# Patient Record
Sex: Female | Born: 1935 | Race: Black or African American | Hispanic: No | Smoking: Never smoker
Health system: Southern US, Community
[De-identification: ages and names within clinical notes are randomized; demographics above are authoritative.]

## PROBLEM LIST (undated history)

## (undated) DIAGNOSIS — D869 Sarcoidosis, unspecified: Secondary | ICD-10-CM

## (undated) DIAGNOSIS — R42 Dizziness and giddiness: Secondary | ICD-10-CM

## (undated) DIAGNOSIS — E876 Hypokalemia: Secondary | ICD-10-CM

## (undated) DIAGNOSIS — E119 Type 2 diabetes mellitus without complications: Secondary | ICD-10-CM

## (undated) DIAGNOSIS — N200 Calculus of kidney: Secondary | ICD-10-CM

## (undated) DIAGNOSIS — K219 Gastro-esophageal reflux disease without esophagitis: Secondary | ICD-10-CM

## (undated) DIAGNOSIS — I10 Essential (primary) hypertension: Secondary | ICD-10-CM

## (undated) DIAGNOSIS — G809 Cerebral palsy, unspecified: Secondary | ICD-10-CM

## (undated) DIAGNOSIS — E559 Vitamin D deficiency, unspecified: Secondary | ICD-10-CM

## (undated) HISTORY — DX: Hypokalemia: E87.6

## (undated) HISTORY — PX: MULTIPLE TOOTH EXTRACTIONS: SHX2053

## (undated) HISTORY — PX: TONSILLECTOMY: SUR1361

## (undated) HISTORY — PX: OTHER SURGICAL HISTORY: SHX169

## (undated) HISTORY — DX: Vitamin D deficiency, unspecified: E55.9

## (undated) HISTORY — PX: LUNG REMOVAL, PARTIAL: SHX233

## (undated) HISTORY — DX: Type 2 diabetes mellitus without complications: E11.9

## (undated) HISTORY — DX: Essential (primary) hypertension: I10

## (undated) HISTORY — PX: EYE SURGERY: SHX253

## (undated) HISTORY — PX: ABDOMINAL HYSTERECTOMY: SHX81

## (undated) HISTORY — DX: Sarcoidosis, unspecified: D86.9

## (undated) HISTORY — DX: Dizziness and giddiness: R42

---

## 1997-10-12 ENCOUNTER — Ambulatory Visit (HOSPITAL_COMMUNITY): Admission: RE | Admit: 1997-10-12 | Discharge: 1997-10-12 | Payer: Self-pay | Admitting: Internal Medicine

## 1997-10-28 ENCOUNTER — Ambulatory Visit: Admission: RE | Admit: 1997-10-28 | Discharge: 1997-10-28 | Payer: Self-pay | Admitting: Internal Medicine

## 1998-01-31 ENCOUNTER — Ambulatory Visit (HOSPITAL_COMMUNITY): Admission: RE | Admit: 1998-01-31 | Discharge: 1998-01-31 | Payer: Self-pay | Admitting: Internal Medicine

## 1999-08-09 ENCOUNTER — Encounter: Admission: RE | Admit: 1999-08-09 | Discharge: 1999-08-09 | Payer: Self-pay | Admitting: Internal Medicine

## 1999-08-09 ENCOUNTER — Encounter: Payer: Self-pay | Admitting: Internal Medicine

## 1999-08-28 ENCOUNTER — Encounter: Payer: Self-pay | Admitting: Internal Medicine

## 1999-08-28 ENCOUNTER — Encounter: Admission: RE | Admit: 1999-08-28 | Discharge: 1999-08-28 | Payer: Self-pay | Admitting: Internal Medicine

## 1999-11-17 ENCOUNTER — Other Ambulatory Visit: Admission: RE | Admit: 1999-11-17 | Discharge: 1999-11-17 | Payer: Self-pay | Admitting: Obstetrics & Gynecology

## 2000-12-23 ENCOUNTER — Encounter: Payer: Self-pay | Admitting: Internal Medicine

## 2000-12-23 ENCOUNTER — Encounter: Admission: RE | Admit: 2000-12-23 | Discharge: 2000-12-23 | Payer: Self-pay | Admitting: Internal Medicine

## 2000-12-26 ENCOUNTER — Encounter: Admission: RE | Admit: 2000-12-26 | Discharge: 2000-12-26 | Payer: Self-pay | Admitting: Internal Medicine

## 2000-12-26 ENCOUNTER — Encounter: Payer: Self-pay | Admitting: Internal Medicine

## 2001-02-11 ENCOUNTER — Other Ambulatory Visit: Admission: RE | Admit: 2001-02-11 | Discharge: 2001-02-11 | Payer: Self-pay | Admitting: Obstetrics and Gynecology

## 2001-02-21 ENCOUNTER — Encounter: Admission: RE | Admit: 2001-02-21 | Discharge: 2001-02-21 | Payer: Self-pay | Admitting: Internal Medicine

## 2001-02-21 ENCOUNTER — Encounter: Payer: Self-pay | Admitting: Internal Medicine

## 2001-05-01 ENCOUNTER — Ambulatory Visit: Admission: RE | Admit: 2001-05-01 | Discharge: 2001-05-01 | Payer: Self-pay | Admitting: Internal Medicine

## 2001-12-17 ENCOUNTER — Encounter: Payer: Self-pay | Admitting: Urology

## 2001-12-17 ENCOUNTER — Encounter: Admission: RE | Admit: 2001-12-17 | Discharge: 2001-12-17 | Payer: Self-pay | Admitting: Urology

## 2001-12-30 ENCOUNTER — Encounter: Admission: RE | Admit: 2001-12-30 | Discharge: 2001-12-30 | Payer: Self-pay | Admitting: Internal Medicine

## 2001-12-30 ENCOUNTER — Encounter: Payer: Self-pay | Admitting: Internal Medicine

## 2002-07-07 ENCOUNTER — Encounter: Admission: RE | Admit: 2002-07-07 | Discharge: 2002-07-07 | Payer: Self-pay | Admitting: Internal Medicine

## 2002-07-07 ENCOUNTER — Encounter: Payer: Self-pay | Admitting: Internal Medicine

## 2003-01-12 ENCOUNTER — Encounter: Payer: Self-pay | Admitting: Internal Medicine

## 2003-01-12 ENCOUNTER — Encounter: Admission: RE | Admit: 2003-01-12 | Discharge: 2003-01-12 | Payer: Self-pay | Admitting: Internal Medicine

## 2003-04-16 ENCOUNTER — Other Ambulatory Visit: Admission: RE | Admit: 2003-04-16 | Discharge: 2003-04-16 | Payer: Self-pay | Admitting: Obstetrics and Gynecology

## 2003-10-04 ENCOUNTER — Encounter: Admission: RE | Admit: 2003-10-04 | Discharge: 2003-10-04 | Payer: Self-pay | Admitting: Internal Medicine

## 2004-06-21 ENCOUNTER — Encounter: Admission: RE | Admit: 2004-06-21 | Discharge: 2004-06-21 | Payer: Self-pay | Admitting: Internal Medicine

## 2005-07-24 ENCOUNTER — Other Ambulatory Visit: Admission: RE | Admit: 2005-07-24 | Discharge: 2005-07-24 | Payer: Self-pay | Admitting: Obstetrics and Gynecology

## 2005-09-05 ENCOUNTER — Encounter: Admission: RE | Admit: 2005-09-05 | Discharge: 2005-09-05 | Payer: Self-pay | Admitting: Internal Medicine

## 2005-10-31 ENCOUNTER — Encounter: Admission: RE | Admit: 2005-10-31 | Discharge: 2005-10-31 | Payer: Self-pay | Admitting: Obstetrics and Gynecology

## 2005-11-14 ENCOUNTER — Encounter: Admission: RE | Admit: 2005-11-14 | Discharge: 2005-11-14 | Payer: Self-pay | Admitting: Obstetrics and Gynecology

## 2007-09-14 ENCOUNTER — Emergency Department (HOSPITAL_COMMUNITY): Admission: EM | Admit: 2007-09-14 | Discharge: 2007-09-14 | Payer: Self-pay | Admitting: Emergency Medicine

## 2007-12-19 ENCOUNTER — Encounter: Admission: RE | Admit: 2007-12-19 | Discharge: 2007-12-19 | Payer: Self-pay | Admitting: Obstetrics and Gynecology

## 2008-07-23 ENCOUNTER — Encounter: Admission: RE | Admit: 2008-07-23 | Discharge: 2008-07-23 | Payer: Self-pay | Admitting: Internal Medicine

## 2008-12-21 ENCOUNTER — Encounter: Admission: RE | Admit: 2008-12-21 | Discharge: 2008-12-21 | Payer: Self-pay | Admitting: Internal Medicine

## 2009-03-13 ENCOUNTER — Emergency Department (HOSPITAL_COMMUNITY): Admission: EM | Admit: 2009-03-13 | Discharge: 2009-03-13 | Payer: Self-pay | Admitting: Emergency Medicine

## 2010-02-09 ENCOUNTER — Encounter: Admission: RE | Admit: 2010-02-09 | Discharge: 2010-02-09 | Payer: Self-pay | Admitting: Internal Medicine

## 2010-02-14 ENCOUNTER — Emergency Department (HOSPITAL_COMMUNITY)
Admission: EM | Admit: 2010-02-14 | Discharge: 2010-02-15 | Payer: Self-pay | Source: Home / Self Care | Admitting: Emergency Medicine

## 2010-03-02 ENCOUNTER — Encounter: Admission: RE | Admit: 2010-03-02 | Discharge: 2010-03-02 | Payer: Self-pay | Admitting: Internal Medicine

## 2010-05-14 ENCOUNTER — Observation Stay (HOSPITAL_COMMUNITY): Admission: EM | Admit: 2010-05-14 | Discharge: 2010-05-18 | Payer: Self-pay | Admitting: Emergency Medicine

## 2010-05-15 ENCOUNTER — Encounter (INDEPENDENT_AMBULATORY_CARE_PROVIDER_SITE_OTHER): Payer: Self-pay | Admitting: Internal Medicine

## 2010-07-30 ENCOUNTER — Encounter: Payer: Self-pay | Admitting: Obstetrics and Gynecology

## 2010-09-19 LAB — GLUCOSE, CAPILLARY
Glucose-Capillary: 105 mg/dL — ABNORMAL HIGH (ref 70–99)
Glucose-Capillary: 108 mg/dL — ABNORMAL HIGH (ref 70–99)
Glucose-Capillary: 111 mg/dL — ABNORMAL HIGH (ref 70–99)
Glucose-Capillary: 148 mg/dL — ABNORMAL HIGH (ref 70–99)
Glucose-Capillary: 151 mg/dL — ABNORMAL HIGH (ref 70–99)
Glucose-Capillary: 155 mg/dL — ABNORMAL HIGH (ref 70–99)
Glucose-Capillary: 158 mg/dL — ABNORMAL HIGH (ref 70–99)
Glucose-Capillary: 183 mg/dL — ABNORMAL HIGH (ref 70–99)
Glucose-Capillary: 184 mg/dL — ABNORMAL HIGH (ref 70–99)
Glucose-Capillary: 95 mg/dL (ref 70–99)
Glucose-Capillary: 98 mg/dL (ref 70–99)
Glucose-Capillary: 99 mg/dL (ref 70–99)

## 2010-09-19 LAB — URINALYSIS, ROUTINE W REFLEX MICROSCOPIC
Bilirubin Urine: NEGATIVE
Glucose, UA: 500 mg/dL — AB
Hgb urine dipstick: NEGATIVE
Ketones, ur: NEGATIVE mg/dL
Protein, ur: NEGATIVE mg/dL
Urobilinogen, UA: 0.2 mg/dL (ref 0.0–1.0)

## 2010-09-19 LAB — BASIC METABOLIC PANEL
BUN: 18 mg/dL (ref 6–23)
Chloride: 112 mEq/L (ref 96–112)
Chloride: 112 mEq/L (ref 96–112)
GFR calc Af Amer: >60 mL/min (ref >60)
GFR calc Af Amer: >60 mL/min (ref >60)
GFR calc non Af Amer: >60 mL/min (ref >60)
Potassium: 4.1 mEq/L (ref 3.5–5.1)
Potassium: 4.4 mEq/L (ref 3.5–5.1)
Sodium: 144 mEq/L (ref 135–145)
Sodium: 145 mEq/L (ref 135–145)

## 2010-09-19 LAB — COMPREHENSIVE METABOLIC PANEL
ALT: 34 U/L (ref 0–35)
ALT: 35 U/L (ref 0–35)
AST: 34 U/L (ref 0–37)
AST: 42 U/L — ABNORMAL HIGH (ref 0–37)
Albumin: 3.7 g/dL (ref 3.5–5.2)
Alkaline Phosphatase: 158 U/L — ABNORMAL HIGH (ref 39–117)
CO2: 25 mEq/L (ref 19–32)
Calcium: 9.6 mg/dL (ref 8.4–10.5)
Calcium: 9.7 mg/dL (ref 8.4–10.5)
GFR calc Af Amer: >60 mL/min (ref >60)
GFR calc Af Amer: >60 mL/min (ref >60)
Glucose, Bld: 165 mg/dL — ABNORMAL HIGH (ref 70–99)
Glucose, Bld: 239 mg/dL — ABNORMAL HIGH (ref 70–99)
Potassium: 3.7 mEq/L (ref 3.5–5.1)
Sodium: 141 mEq/L (ref 135–145)
Sodium: 144 mEq/L (ref 135–145)
Total Protein: 7.7 g/dL (ref 6.0–8.3)
Total Protein: 8 g/dL (ref 6.0–8.3)

## 2010-09-19 LAB — POCT CARDIAC MARKERS
CKMB, poc: 2.3 ng/mL (ref 1.0–8.0)
Myoglobin, poc: 58.6 ng/mL (ref 12–200)

## 2010-09-19 LAB — DIFFERENTIAL
Basophils Relative: 0 % (ref 0–1)
Eosinophils Absolute: 0 10*3/uL (ref 0.0–0.7)
Eosinophils Absolute: 0 10*3/uL (ref 0.0–0.7)
Eosinophils Relative: 0 % (ref 0–5)
Lymphs Abs: 1.3 10*3/uL (ref 0.7–4.0)
Lymphs Abs: 1.6 10*3/uL (ref 0.7–4.0)
Monocytes Relative: 5 % (ref 3–12)
Monocytes Relative: 5 % (ref 3–12)
Neutrophils Relative %: 84 % — ABNORMAL HIGH (ref 43–77)
Neutrophils Relative %: 87 % — ABNORMAL HIGH (ref 43–77)

## 2010-09-19 LAB — CBC
HCT: 41.3 % (ref 36.0–46.0)
HCT: 46 % (ref 36.0–46.0)
Hemoglobin: 15.4 g/dL — ABNORMAL HIGH (ref 12.0–15.0)
MCH: 27.9 pg (ref 26.0–34.0)
MCHC: 33.5 g/dL (ref 30.0–36.0)
MCV: 83.5 fL (ref 78.0–100.0)
MCV: 83.8 fL (ref 78.0–100.0)
Platelets: 208 10*3/uL (ref 150–400)
RBC: 4.92 MIL/uL (ref 3.87–5.11)
RBC: 5.32 MIL/uL — ABNORMAL HIGH (ref 3.87–5.11)
RDW: 14 % (ref 11.5–15.5)
WBC: 17.1 10*3/uL — ABNORMAL HIGH (ref 4.0–10.5)
WBC: 7 10*3/uL (ref 4.0–10.5)

## 2010-09-19 LAB — URINE CULTURE
Colony Count: NO GROWTH
Culture: NO GROWTH

## 2010-09-19 LAB — MAGNESIUM: Magnesium: 2 mg/dL (ref 1.5–2.5)

## 2010-09-19 LAB — HEMOGLOBIN A1C: Mean Plasma Glucose: 140 mg/dL — ABNORMAL HIGH (ref <117)

## 2010-09-19 LAB — PROTIME-INR
INR: 0.94 (ref 0.00–1.49)
Prothrombin Time: 12.8 seconds (ref 11.6–15.2)

## 2010-09-19 LAB — LIPID PANEL
Cholesterol: 131 mg/dL (ref 0–200)
Total CHOL/HDL Ratio: 1.6 RATIO
VLDL: 14 mg/dL (ref 0–40)

## 2010-09-19 LAB — CK TOTAL AND CKMB (NOT AT ARMC): Relative Index: INVALID (ref 0.0–2.5)

## 2010-09-19 LAB — CARDIAC PANEL(CRET KIN+CKTOT+MB+TROPI): Relative Index: INVALID (ref 0.0–2.5)

## 2010-09-22 LAB — DIFFERENTIAL
Basophils Relative: 1 % (ref 0–1)
Eosinophils Relative: 1 % (ref 0–5)
Lymphocytes Relative: 21 % (ref 12–46)
Monocytes Absolute: 0.3 10*3/uL (ref 0.1–1.0)
Monocytes Relative: 4 % (ref 3–12)
Neutro Abs: 5.2 10*3/uL (ref 1.7–7.7)

## 2010-09-22 LAB — CBC
HCT: 50.8 % — ABNORMAL HIGH (ref 36.0–46.0)
Hemoglobin: 16.9 g/dL — ABNORMAL HIGH (ref 12.0–15.0)
MCHC: 33.2 g/dL (ref 30.0–36.0)
MCV: 84 fL (ref 78.0–100.0)

## 2010-09-22 LAB — URINALYSIS, ROUTINE W REFLEX MICROSCOPIC
Glucose, UA: NEGATIVE mg/dL
Hgb urine dipstick: NEGATIVE
Ketones, ur: NEGATIVE mg/dL
pH: 7.5 (ref 5.0–8.0)

## 2010-09-22 LAB — POCT CARDIAC MARKERS
CKMB, poc: 1.7 ng/mL (ref 1.0–8.0)
Troponin i, poc: <0.05 ng/mL (ref 0.00–0.09)

## 2010-09-22 LAB — POCT I-STAT, CHEM 8
Chloride: 107 mEq/L (ref 96–112)
HCT: 55 % — ABNORMAL HIGH (ref 36.0–46.0)
Potassium: 3.5 mEq/L (ref 3.5–5.1)

## 2010-10-19 ENCOUNTER — Encounter: Payer: Self-pay | Admitting: Internal Medicine

## 2010-10-31 ENCOUNTER — Emergency Department (HOSPITAL_COMMUNITY)
Admission: EM | Admit: 2010-10-31 | Discharge: 2010-11-01 | Disposition: A | Discharge status: Home / Self Care | Payer: Medicare Other | Attending: Emergency Medicine | Admitting: Emergency Medicine

## 2010-10-31 DIAGNOSIS — R5381 Other malaise: Secondary | ICD-10-CM | POA: Insufficient documentation

## 2010-10-31 DIAGNOSIS — G809 Cerebral palsy, unspecified: Secondary | ICD-10-CM | POA: Insufficient documentation

## 2010-10-31 DIAGNOSIS — I10 Essential (primary) hypertension: Secondary | ICD-10-CM | POA: Insufficient documentation

## 2010-10-31 DIAGNOSIS — R000 Tachycardia, unspecified: Secondary | ICD-10-CM | POA: Insufficient documentation

## 2010-11-01 LAB — COMPREHENSIVE METABOLIC PANEL
ALT: 27 U/L (ref 0–35)
AST: 31 U/L (ref 0–37)
Alkaline Phosphatase: 126 U/L — ABNORMAL HIGH (ref 39–117)
CO2: 27 mEq/L (ref 19–32)
Calcium: 8.7 mg/dL (ref 8.4–10.5)
Chloride: 105 mEq/L (ref 96–112)
GFR calc Af Amer: >60 mL/min (ref >60)
GFR calc non Af Amer: >60 mL/min (ref >60)
Potassium: 3.7 mEq/L (ref 3.5–5.1)
Sodium: 140 mEq/L (ref 135–145)

## 2010-11-01 LAB — CBC
HCT: 45.7 % (ref 36.0–46.0)
Hemoglobin: 15.3 g/dL — ABNORMAL HIGH (ref 12.0–15.0)
MCV: 80.5 fL (ref 78.0–100.0)
WBC: 15.1 10*3/uL — ABNORMAL HIGH (ref 4.0–10.5)

## 2010-11-01 LAB — DIFFERENTIAL
Lymphocytes Relative: 10 % — ABNORMAL LOW (ref 12–46)
Lymphs Abs: 1.5 10*3/uL (ref 0.7–4.0)
Neutro Abs: 12.3 10*3/uL — ABNORMAL HIGH (ref 1.7–7.7)
Neutrophils Relative %: 82 % — ABNORMAL HIGH (ref 43–77)

## 2010-11-01 LAB — URINALYSIS, ROUTINE W REFLEX MICROSCOPIC
Glucose, UA: NEGATIVE mg/dL
Hgb urine dipstick: NEGATIVE
Specific Gravity, Urine: 1.013 (ref 1.005–1.030)
pH: 7 (ref 5.0–8.0)

## 2010-11-02 LAB — URINE CULTURE: Colony Count: NO GROWTH

## 2011-01-25 ENCOUNTER — Other Ambulatory Visit: Payer: Self-pay | Admitting: Internal Medicine

## 2011-01-25 DIAGNOSIS — M545 Low back pain: Secondary | ICD-10-CM

## 2011-01-29 ENCOUNTER — Ambulatory Visit
Admission: RE | Admit: 2011-01-29 | Discharge: 2011-01-29 | Disposition: A | Discharge status: Home / Self Care | Payer: Medicare Other | Source: Ambulatory Visit | Attending: Internal Medicine | Admitting: Internal Medicine

## 2011-01-29 DIAGNOSIS — M545 Low back pain: Secondary | ICD-10-CM

## 2011-04-02 LAB — I-STAT 8, (EC8 V) (CONVERTED LAB)
BUN: 12
Bicarbonate: 28.2 — ABNORMAL HIGH
HCT: 50 — ABNORMAL HIGH
Hemoglobin: 17 — ABNORMAL HIGH
Operator id: 294521
Sodium: 140
TCO2: 30

## 2011-04-02 LAB — POCT I-STAT CREATININE: Operator id: 294521

## 2011-09-07 ENCOUNTER — Emergency Department (HOSPITAL_COMMUNITY): Payer: Medicare Other

## 2011-09-07 ENCOUNTER — Encounter (HOSPITAL_COMMUNITY): Payer: Self-pay | Admitting: Emergency Medicine

## 2011-09-07 ENCOUNTER — Emergency Department (HOSPITAL_COMMUNITY)
Admission: EM | Admit: 2011-09-07 | Discharge: 2011-09-08 | Disposition: A | Discharge status: Home / Self Care | Payer: Medicare Other | Attending: Emergency Medicine | Admitting: Emergency Medicine

## 2011-09-07 ENCOUNTER — Other Ambulatory Visit: Payer: Self-pay

## 2011-09-07 DIAGNOSIS — E876 Hypokalemia: Secondary | ICD-10-CM | POA: Insufficient documentation

## 2011-09-07 DIAGNOSIS — E860 Dehydration: Secondary | ICD-10-CM | POA: Insufficient documentation

## 2011-09-07 DIAGNOSIS — R5381 Other malaise: Secondary | ICD-10-CM | POA: Insufficient documentation

## 2011-09-07 DIAGNOSIS — J984 Other disorders of lung: Secondary | ICD-10-CM | POA: Insufficient documentation

## 2011-09-07 DIAGNOSIS — E119 Type 2 diabetes mellitus without complications: Secondary | ICD-10-CM | POA: Insufficient documentation

## 2011-09-07 DIAGNOSIS — I10 Essential (primary) hypertension: Secondary | ICD-10-CM | POA: Insufficient documentation

## 2011-09-07 DIAGNOSIS — G809 Cerebral palsy, unspecified: Secondary | ICD-10-CM | POA: Insufficient documentation

## 2011-09-07 HISTORY — DX: Cerebral palsy, unspecified: G80.9

## 2011-09-07 LAB — POCT I-STAT, CHEM 8
Creatinine, Ser: 0.6 mg/dL (ref 0.50–1.10)
Glucose, Bld: 149 mg/dL — ABNORMAL HIGH (ref 70–99)
Hemoglobin: 18.4 g/dL — ABNORMAL HIGH (ref 12.0–15.0)
TCO2: 29 mmol/L (ref 0–100)

## 2011-09-07 LAB — DIFFERENTIAL
Basophils Relative: 0 % (ref 0–1)
Eosinophils Absolute: 0.1 10*3/uL (ref 0.0–0.7)
Monocytes Absolute: 0.7 10*3/uL (ref 0.1–1.0)
Monocytes Relative: 9 % (ref 3–12)

## 2011-09-07 LAB — CBC
HCT: 51.4 % — ABNORMAL HIGH (ref 36.0–46.0)
Hemoglobin: 17.7 g/dL — ABNORMAL HIGH (ref 12.0–15.0)
MCH: 28.2 pg (ref 26.0–34.0)
MCHC: 34.4 g/dL (ref 30.0–36.0)

## 2011-09-07 MED ORDER — POTASSIUM CHLORIDE CRYS ER 20 MEQ PO TBCR
40.0000 meq | EXTENDED_RELEASE_TABLET | Freq: Once | ORAL | Status: AC
  Administered 2011-09-07: 40 meq via ORAL
  Filled 2011-09-07: qty 2

## 2011-09-07 MED ORDER — POTASSIUM CHLORIDE 10 MEQ/100ML IV SOLN
10.0000 meq | Freq: Once | INTRAVENOUS | Status: AC
  Administered 2011-09-07: 10 meq via INTRAVENOUS
  Filled 2011-09-07: qty 100

## 2011-09-07 NOTE — ED Notes (Signed)
MD at bedside. Dr. Pickering at bedside.  

## 2011-09-07 NOTE — ED Provider Notes (Signed)
History     CSN: [id number]  Arrival date & time 09/07/11  1931   First MD Initiated Contact with Patient 09/07/11 2155      Chief Complaint  Patient presents with  . Weakness    (Consider location/radiation/quality/duration/timing/severity/associated sxs/prior treatment) Patient is a 78 y.o. female presenting with weakness. The history is provided by the patient.  Weakness Primary symptoms do not include headaches, nausea or vomiting.  Additional symptoms include weakness. Additional symptoms do not include neck stiffness.   patient has generalized weakness. She states she fell 3 times daily was unable to get up. She does have a history of cerebral palsy and falls unusual for her. She states that she is usually able to get up from them though. No chest pain. No localizing weakness. No cough. No chest pain. No nausea vomiting diarrhea. No cough strike that no dysuria. He states his been eating well. She does have a history of hypertension.  Past Medical History  Diagnosis Date  . Essential hypertension, malignant   . Type II or unspecified type diabetes mellitus without mention of complication, not stated as uncontrolled   . Dizziness and giddiness   . Unspecified vitamin D deficiency   . Sarcoidosis   . Cerebral palsy     Past Surgical History  Procedure Date  . Cerebral palsy surgeries   . Cesarean section   . Abdominal hysterectomy   . Lung removal, partial     Family History  Problem Relation Age of Onset  . Hypertension Mother   . Stroke Mother   . Cancer Father     History  Substance Use Topics  . Smoking status: Never Smoker   . Smokeless tobacco: Not on file  . Alcohol Use: No    OB History    Grav Para Term Preterm Abortions TAB SAB Ect Mult Living                  Review of Systems  Constitutional: Negative for activity change and appetite change.  HENT: Negative for neck stiffness.   Eyes: Negative for pain.  Respiratory: Negative for chest  tightness and shortness of breath.   Cardiovascular: Negative for chest pain and leg swelling.  Gastrointestinal: Negative for nausea, vomiting, abdominal pain and diarrhea.  Genitourinary: Negative for flank pain.  Musculoskeletal: Negative for back pain.  Skin: Negative for rash.  Neurological: Positive for weakness. Negative for numbness and headaches.  Psychiatric/Behavioral: Negative for behavioral problems.    Allergies  Penicillins  Home Medications   Current Outpatient Rx  Name Route Sig Dispense Refill  . ALPRAZOLAM 0.25 MG PO TABS Oral Take 0.25 mg by mouth as needed.    Marland Kitchen AMLODIPINE BESYLATE 10 MG PO TABS Oral Take 10 mg by mouth daily.      . ASPIRIN 81 MG PO TABS Oral Take 81 mg by mouth daily.      . AZELASTINE HCL 137 MCG/SPRAY NA SOLN Nasal 1 spray by Nasal route. Use in each nostril as directed     . VITAMIN D PO Oral Take 1,000 Units by mouth daily.      Marland Kitchen GLIPIZIDE 5 MG PO TABS Oral Take 5 mg by mouth daily.      Marland Kitchen LATANOPROST 0.005 % OP SOLN Both Eyes Place 1 drop into both eyes at bedtime.     Marland Kitchen ONE-DAILY MULTI VITAMINS PO TABS Oral Take 1 tablet by mouth daily.      . MECLIZINE HCL 12.5 MG PO TABS  Oral Take 12.5 mg by mouth 3 (three) times daily as needed.        BP 184/110  Pulse 92  Temp(Src) 97.9 F (36.6 C) (Oral)  Resp 16  SpO2 98%  Physical Exam  Nursing note and vitals reviewed. Constitutional: She is oriented to person, place, and time. She appears well-developed and well-nourished.  HENT:  Head: Normocephalic and atraumatic.  Eyes: EOM are normal. Pupils are equal, round, and reactive to light.  Neck: Normal range of motion. Neck supple.  Cardiovascular: Normal rate, regular rhythm and normal heart sounds.   No murmur heard. Pulmonary/Chest: Effort normal and breath sounds normal. No respiratory distress. She has no wheezes. She has no rales.  Abdominal: Soft. Bowel sounds are normal. She exhibits no distension. There is no tenderness. There  is no rebound and no guarding.  Musculoskeletal: Normal range of motion.  Neurological: She is alert and oriented to person, place, and time. No cranial nerve deficit.  Skin: Skin is warm and dry.  Psychiatric: She has a normal mood and affect. Her speech is normal.    ED Course  Procedures (including critical care time)  Labs Reviewed  CBC - Abnormal; Notable for the following:    RBC 6.27 (*)    Hemoglobin 17.7 (*)    HCT 51.4 (*)    All other components within normal limits  POCT I-STAT, CHEM 8 - Abnormal; Notable for the following:    Sodium 147 (*)    Potassium 2.9 (*)    Glucose, Bld 149 (*)    Hemoglobin 18.4 (*)    HCT 54.0 (*)    All other components within normal limits  DIFFERENTIAL  POCT I-STAT TROPONIN I  URINALYSIS, ROUTINE W REFLEX MICROSCOPIC   Dg Chest 2 View  09/07/2011  *RADIOLOGY REPORT*  Clinical Data: Weakness and hypertension  CHEST - 2 VIEW  Comparison: 05/14/2010  Findings: Heart size is normal.  Similar appearance of pulmonary venous congestion.  No pleural effusion or overt edema.  Scarring is noted in the right upper lobe.  No airspace consolidation.  IMPRESSION:  1.  Pulmonary venous congestion. 2.  Right upper lobe scarring.  Original Report Authenticated By: Rosealee Albee, M.D.     1. Hypokalemia   2. Dehydration     Date: 09/07/2011  Rate: 74  Rhythm: normal sinus rhythm  QRS Axis: normal  Intervals: normal  ST/T Wave abnormalities: normal  Conduction Disutrbances:right bundle branch block and left anterior fascicular block  Narrative Interpretation:   Old EKG Reviewed: unchanged     MDM  Patient with generalized weakness. His son to be likely dehydrated with hypokalemia. She states she is supplementation home, but has not been taking it. She does have hypertension, but no chest pain. She will take her medicines when she gets home. EKG is stable. At this time just urinalysis is pending   Juliet Rude. Rubin Payor, MD 09/07/11 2329

## 2011-09-07 NOTE — ED Notes (Signed)
Pt stated that she could not urinate. 

## 2011-09-07 NOTE — ED Notes (Signed)
Patient returned from X-ray 

## 2011-09-07 NOTE — ED Notes (Signed)
Pt states this morning she was going out to get the paper and when she turned to shut the door she fell and was unable to get up  Pt states she called a friend who came and helped her back in  Pt states this afternoon she was going to go to the beauty shop and fell again  Pt states she falls often but usually she is ok but today she feels weak all over  Pt denies pain of any kind at this time

## 2011-09-08 LAB — URINALYSIS, ROUTINE W REFLEX MICROSCOPIC
Bilirubin Urine: NEGATIVE
Hgb urine dipstick: NEGATIVE
Protein, ur: NEGATIVE mg/dL
Urobilinogen, UA: 0.2 mg/dL (ref 0.0–1.0)

## 2011-09-08 NOTE — Discharge Instructions (Signed)
Please take your home potassium and plenty of oral fluids.  Recheck with your doctor on Monday morning.  Return if worse at any time.   Dehydration Dehydration is the reduction of water and fluid from the body to a level below that required for proper functioning. CAUSES  Dehydration occurs when there is excessive fluid loss from the body or when loss of normal fluids is not adequately replaced.  Loss of fluids occurs in vomiting, diarrhea, excessive sweating, excessive urine output, or excessive loss of fluid from the lungs (as occurs in fever or in patients on a ventilator).   Inadequate fluid replacement occurs with nausea or decreased appetite due to illness, sore throat, or mouth pain.  SYMPTOMS  Mild dehydration  Thirst (infants and young children may not be able to tell you they are thirsty).   Dry lips.   Slightly dry mouth membranes.  Moderate dehydration  Very dry mouth membranes.   Sunken eyes.   Sunken soft spot (fontanelle) on infant's head.   Skin does not bounce back quickly when lightly pinched and released.   Decreased urine production.   Decreased tear production.  Severe dehydration  Rapid, weak pulse (more than 100 beats per minute at rest).   Cold hands and feet.   Loss of ability to sweat in spite of heat and temperature.   Rapid breathing.   Blue lips.   Confusion, lethargy, difficult to arouse.   Minimal urine production.   No tears.  DIAGNOSIS  Your caregiver will diagnose dehydration based on your symptoms and your exam. Blood and urine tests will help confirm the diagnosis. The diagnostic evaluation should also identify the cause of dehydration. PREVENTION  The body depends on a proper balance of fluid and salts (electrolytes) for normal function. Adequate fluid intake in the presence of illness or other stresses (such as extreme exercise) is important.  TREATMENT   Mild dehydration is safe to self-treat for most ages as long as it  does not worsen. Contact your caregiver for even mild dehydration in infants and the elderly.   In teenagers and adults with moderate dehydration, careful home treatment (as outlined below) can be safe. Phone contact with a caregiver is advised. Children under 67 years of age with moderate dehydration should see a caregiver.   If you or your child is severely dehydrated, go to a hospital for treatment. Intravenous (IV) fluids will quickly reverse dehydration and are often lifesaving in young children, infants, and elderly persons.  HOME CARE INSTRUCTIONS  Small amounts of fluids should be taken frequently. Large amounts at one time may not be tolerated. Plain water may be harmful in infants and the elderly. Oral rehydration solutions (ORS) are available at pharmacies and grocery stores. ORS replaces water and important electrolytes in proper proportions. Sports drinks are not as effective as ORS and may be harmful because the sugar can make diarrhea worse.  As a general guideline for children, replace any new fluid losses from diarrhea and/or vomiting with ORS as follows:   If your child weighs 22 pounds or under (10 kg or less), give 60-120 mL (1/4-1/2 cup or 2-4 ounces) of ORS for each diarrheal stool or vomiting episode.   If your child weighs more than 22 pounds (more than 10 kg), give 120-240 mL (1/2-1 cup or 4-8 ounces) of ORS for each diarrheal stool or vomiting episode.   If your child is vomiting, it may be helpful to give the above ORS replacement in 5 mL (  1 teaspoon) amounts every 5 minutes and increase as tolerated.   While correcting for dehydration, children should eat normally. However, foods high in sugar should be avoided because they may worsen diarrhea. Large amounts of carbonated soft drinks, juice, gelatin desserts, and other highly sugared drinks should be avoided.   After correction of dehydration, other liquids that are appealing to the child may be added. Children should  drink small amounts of fluids frequently and fluids should be increased as tolerated. Children should drink enough fluids to keep urine clear or pale yellow.   Adults should eat normally while drinking more fluids than usual. Drink small amounts of fluids frequently and increase the amount as tolerated. Drink enough fluids to keep urine clear or pale yellow. Broths, weak decaffeinated tea, lemon-lime soft drinks (allowed to go flat), and ORS replace fluids and electrolytes.   Avoid:   Carbonated drinks.   Juice.   Extremely hot or cold fluids.   Caffeine drinks.   Fatty, greasy foods.   Alcohol.   Tobacco.   Too much intake of anything at one time.   Gelatin desserts.   Probiotics are active cultures of beneficial bacteria. They may lessen the amount and number of diarrheal stools in adults. Probiotics can be found in yogurt with active cultures and in supplements.   Wash your hands well to avoid spreading germs (bacteria) and viruses.   Antidiarrheal medicines are not recommended for infants and children.   Only take over-the-counter or prescription medicines for pain, discomfort, or fever as directed by your caregiver. Do not give aspirin to children.   For adults with dehydration, ask your caregiver if you should continue all prescribed and over-the-counter medicines.   If your caregiver has given you a follow-up appointment, it is very important to keep that appointment. Not keeping the appointment could result in a lasting (chronic) or permanent injury and disability. If there is any problem keeping the appointment, you must call to reschedule.  SEEK IMMEDIATE MEDICAL CARE IF:   You are unable to keep fluids down or other symptoms become worse despite treatment.   Vomiting or diarrhea develops and becomes persistent.   There is vomiting of blood or green matter (bile).   There is blood in the stool or the stools are black and tarry.   There is no urine output in 6 to  8 hours or there is only a small amount of very dark urine.   Abdominal pain develops, increases, or localizes.   You or your child has an oral temperature above 102 F (38.9 C), not controlled by medicine.   Your baby is older than 3 months with a rectal temperature of 102.57F (38.9 C) or higher.   Your baby is 11 months old or younger with a rectal temperature of 100.4 F (38 C) or higher.   You develop excessive weakness, dizziness, fainting, or extreme thirst.   You develop a rash, stiff neck, severe headache, or you become irritable, sleepy, or difficult to awaken.  MAKE SURE YOU:   Understand these instructions.   Will watch your condition.   Will get help right away if you are not doing well or get worse.  Document Released: 06/25/2005 Document Revised: 01/08/2011 Document Reviewed: 05/24/2009 [facility] Patient Information 2012 [place], [place].Hypokalemia Hypokalemia means a low potassium level in the blood.Potassium is an electrolyte that helps regulate the amount of fluid in the body. It also stimulates muscle contraction and maintains a stable acid-base balance.Most of the body's potassium  is inside of cells, and only a very small amount is in the blood. Because the amount in the blood is so small, minor changes can have big effects. PREPARATION FOR TEST Testing for potassium requires taking a blood sample taken by needle from a vein in the arm. The skin is cleaned thoroughly before the sample is drawn. There is no other special preparation needed. NORMAL VALUES Potassium levels below 3.5 mEq/L are abnormally low. Levels above 5.1 mEq/L are abnormally high. Ranges for normal findings may vary among different laboratories and hospitals. You should always check with your doctor after having lab work or other tests done to discuss the meaning of your test results and whether your values are considered within normal limits. MEANING OF TEST  Your caregiver will go over the test  results with you and discuss the importance and meaning of your results, as well as treatment options and the need for additional tests, if necessary. A potassium level is frequently part of a routine medical exam. It is usually included as part of a whole "panel" of tests for several blood salts (such as Sodium and Chloride). It may be done as part of follow-up when a low potassium level was found in the past or other blood salts are suspected of being out of balance. A low potassium level might be suspected if you have one or more of the following:  Symptoms of weakness.   Abnormal heart rhythms.   High blood pressure and are taking medication to control this, especially water pills (diuretics).   Kidney disease that can affect your potassium level .   Diabetes requiring the use of insulin. The potassium may fall after taking insulin, especially if the diabetes had been out of control for a while.   A condition requiring the use of cortisone-type medication or certain types of antibiotics.   Vomiting and/or diarrhea for more than a day or two.   A stomach or intestinal condition that may not permit appropriate absorption of potassium.   Fainting episodes.   Mental confusion.  OBTAINING TEST RESULTS It is your responsibility to obtain your test results. Ask the lab or department performing the test when and how you will get your results.  Please contact your caregiver directly if you have not received the results within one week. At that time, ask if there is anything different or new you should be doing in relation to the results. TREATMENT Hypokalemia can be treated with potassium supplements taken by mouth and/or adjustments in your current medications. A diet high in potassium is also helpful. Foods with high potassium content are:  Peas, lentils, lima beans, nuts, and dried fruit.   Whole grain and bran cereals and breads.   Fresh fruit, vegetables (bananas, cantaloupe,  grapefruit, oranges, tomatoes, honeydew melons, potatoes).   Orange and tomato juices.   Meats. If potassium supplement has been prescribed for you today or your medications have been adjusted, see your personal caregiver in time02 for a re-check.  SEEK MEDICAL CARE IF:  There is a feeling of worsening weakness.   You experience repeated chest palpitations.   You are diabetic and having difficulty keeping your blood sugars in the normal range.   You are experiencing vomiting and/or diarrhea.   You are having difficulty with any of your regular medications.  SEEK IMMEDIATE MEDICAL CARE IF:  You experience chest pain, shortness of breath, or episodes of dizziness.   You have been having vomiting or diarrhea for more than  2 days.   You have a fainting episode.  MAKE SURE YOU:   Understand these instructions.   Will watch your condition.   Will get help right away if you are not doing well or get worse.  Document Released: 06/25/2005 Document Revised: 03/07/2011 Document Reviewed: 06/05/2008 [facility] Patient Information 2012 [place], [place].

## 2011-09-08 NOTE — ED Notes (Signed)
MD at bedside. Dr. Ray at bedside.  

## 2011-12-27 ENCOUNTER — Other Ambulatory Visit: Payer: Self-pay | Admitting: Internal Medicine

## 2012-03-12 ENCOUNTER — Telehealth (HOSPITAL_COMMUNITY): Payer: Self-pay | Admitting: Hematology

## 2012-07-21 ENCOUNTER — Other Ambulatory Visit: Payer: Self-pay | Admitting: Internal Medicine

## 2012-07-21 ENCOUNTER — Ambulatory Visit
Admission: RE | Admit: 2012-07-21 | Discharge: 2012-07-21 | Disposition: A | Discharge status: Home / Self Care | Payer: Medicare Other | Source: Ambulatory Visit | Attending: Internal Medicine | Admitting: Internal Medicine

## 2012-07-21 DIAGNOSIS — J40 Bronchitis, not specified as acute or chronic: Secondary | ICD-10-CM

## 2012-07-21 DIAGNOSIS — R0602 Shortness of breath: Secondary | ICD-10-CM

## 2012-11-17 ENCOUNTER — Encounter: Payer: Self-pay | Admitting: Hematology

## 2012-12-22 ENCOUNTER — Ambulatory Visit (HOSPITAL_COMMUNITY)
Admission: RE | Admit: 2012-12-22 | Discharge: 2012-12-22 | Disposition: A | Discharge status: Home / Self Care | Payer: Medicare Other | Source: Ambulatory Visit | Attending: Internal Medicine | Admitting: Internal Medicine

## 2012-12-22 ENCOUNTER — Other Ambulatory Visit (HOSPITAL_COMMUNITY): Payer: Self-pay | Admitting: Internal Medicine

## 2012-12-22 DIAGNOSIS — M7989 Other specified soft tissue disorders: Secondary | ICD-10-CM

## 2012-12-22 DIAGNOSIS — M25562 Pain in left knee: Secondary | ICD-10-CM

## 2012-12-22 DIAGNOSIS — M79609 Pain in unspecified limb: Secondary | ICD-10-CM

## 2012-12-22 LAB — COMPREHENSIVE METABOLIC PANEL
ALT: 18 U/L (ref 0–35)
CO2: 27 mEq/L (ref 19–32)
Calcium: 9.5 mg/dL (ref 8.4–10.5)
Chloride: 104 mEq/L (ref 96–112)
Creatinine, Ser: 0.52 mg/dL (ref 0.50–1.10)
GFR calc Af Amer: >90 mL/min (ref >90)
GFR calc non Af Amer: 89 mL/min — ABNORMAL LOW (ref >90)
Glucose, Bld: 174 mg/dL — ABNORMAL HIGH (ref 70–99)
Sodium: 144 mEq/L (ref 135–145)
Total Bilirubin: 0.2 mg/dL — ABNORMAL LOW (ref 0.3–1.2)

## 2012-12-22 LAB — CBC
Hemoglobin: 15 g/dL (ref 12.0–15.0)
MCH: 26.7 pg (ref 26.0–34.0)
MCV: 79.7 fL (ref 78.0–100.0)
RBC: 5.62 MIL/uL — ABNORMAL HIGH (ref 3.87–5.11)

## 2012-12-22 LAB — HEMOGLOBIN A1C: Mean Plasma Glucose: 148 mg/dL — ABNORMAL HIGH (ref <117)

## 2012-12-22 NOTE — Progress Notes (Signed)
*  PRELIMINARY RESULTS* Vascular Ultrasound Left lower extremity venous duplex has been completed.  Preliminary findings: Left = negative for DVT.  Called report to [place] who relayed to Dr. August Saucer.    Farrel Demark, RDMS, RVT  12/22/2012, 4:45 PM

## 2013-04-03 ENCOUNTER — Other Ambulatory Visit (HOSPITAL_COMMUNITY): Payer: Self-pay | Admitting: Internal Medicine

## 2013-04-03 DIAGNOSIS — R1310 Dysphagia, unspecified: Secondary | ICD-10-CM

## 2013-04-09 ENCOUNTER — Ambulatory Visit (HOSPITAL_COMMUNITY)
Admission: RE | Admit: 2013-04-09 | Discharge: 2013-04-09 | Disposition: A | Discharge status: Home / Self Care | Payer: Medicare Other | Source: Ambulatory Visit | Attending: Internal Medicine | Admitting: Internal Medicine

## 2013-04-09 ENCOUNTER — Ambulatory Visit (HOSPITAL_COMMUNITY): Payer: Medicare Other

## 2013-04-09 ENCOUNTER — Other Ambulatory Visit (HOSPITAL_COMMUNITY): Payer: Self-pay | Admitting: Internal Medicine

## 2013-04-09 DIAGNOSIS — R1310 Dysphagia, unspecified: Secondary | ICD-10-CM | POA: Insufficient documentation

## 2013-10-05 ENCOUNTER — Other Ambulatory Visit: Payer: Self-pay

## 2013-10-05 DIAGNOSIS — Z1231 Encounter for screening mammogram for malignant neoplasm of breast: Secondary | ICD-10-CM

## 2013-11-03 ENCOUNTER — Ambulatory Visit: Payer: Medicare Other

## 2014-06-22 ENCOUNTER — Observation Stay (HOSPITAL_COMMUNITY): Payer: Medicare Other

## 2014-06-22 ENCOUNTER — Emergency Department (HOSPITAL_COMMUNITY): Payer: Medicare Other

## 2014-06-22 ENCOUNTER — Observation Stay (HOSPITAL_COMMUNITY)
Admission: EM | Admit: 2014-06-22 | Discharge: 2014-06-24 | Disposition: A | Discharge status: Home / Self Care | Payer: Medicare Other | Attending: Internal Medicine | Admitting: Internal Medicine

## 2014-06-22 ENCOUNTER — Encounter (HOSPITAL_COMMUNITY): Payer: Self-pay | Admitting: Physical Medicine and Rehabilitation

## 2014-06-22 DIAGNOSIS — Z7982 Long term (current) use of aspirin: Secondary | ICD-10-CM | POA: Insufficient documentation

## 2014-06-22 DIAGNOSIS — Z823 Family history of stroke: Secondary | ICD-10-CM | POA: Diagnosis not present

## 2014-06-22 DIAGNOSIS — G459 Transient cerebral ischemic attack, unspecified: Principal | ICD-10-CM | POA: Diagnosis present

## 2014-06-22 DIAGNOSIS — E785 Hyperlipidemia, unspecified: Secondary | ICD-10-CM | POA: Diagnosis not present

## 2014-06-22 DIAGNOSIS — Z8673 Personal history of transient ischemic attack (TIA), and cerebral infarction without residual deficits: Secondary | ICD-10-CM

## 2014-06-22 DIAGNOSIS — G809 Cerebral palsy, unspecified: Secondary | ICD-10-CM | POA: Diagnosis not present

## 2014-06-22 DIAGNOSIS — R531 Weakness: Secondary | ICD-10-CM | POA: Insufficient documentation

## 2014-06-22 DIAGNOSIS — R269 Unspecified abnormalities of gait and mobility: Secondary | ICD-10-CM | POA: Diagnosis not present

## 2014-06-22 DIAGNOSIS — R200 Anesthesia of skin: Secondary | ICD-10-CM | POA: Diagnosis present

## 2014-06-22 DIAGNOSIS — I6523 Occlusion and stenosis of bilateral carotid arteries: Secondary | ICD-10-CM | POA: Diagnosis not present

## 2014-06-22 DIAGNOSIS — I10 Essential (primary) hypertension: Secondary | ICD-10-CM

## 2014-06-22 DIAGNOSIS — E559 Vitamin D deficiency, unspecified: Secondary | ICD-10-CM | POA: Diagnosis not present

## 2014-06-22 DIAGNOSIS — D869 Sarcoidosis, unspecified: Secondary | ICD-10-CM | POA: Diagnosis not present

## 2014-06-22 DIAGNOSIS — E119 Type 2 diabetes mellitus without complications: Secondary | ICD-10-CM

## 2014-06-22 DIAGNOSIS — R208 Other disturbances of skin sensation: Secondary | ICD-10-CM

## 2014-06-22 DIAGNOSIS — Z880 Allergy status to penicillin: Secondary | ICD-10-CM | POA: Insufficient documentation

## 2014-06-22 DIAGNOSIS — I517 Cardiomegaly: Secondary | ICD-10-CM

## 2014-06-22 DIAGNOSIS — I639 Cerebral infarction, unspecified: Secondary | ICD-10-CM

## 2014-06-22 LAB — APTT: APTT: 29 s (ref 24–37)

## 2014-06-22 LAB — I-STAT CHEM 8, ED
BUN: 14 mg/dL (ref 6–23)
CHLORIDE: 104 meq/L (ref 96–112)
CREATININE: 0.6 mg/dL (ref 0.50–1.10)
Calcium, Ion: 1.15 mmol/L (ref 1.13–1.30)
Glucose, Bld: 184 mg/dL — ABNORMAL HIGH (ref 70–99)
HCT: 51 % — ABNORMAL HIGH (ref 36.0–46.0)
HEMOGLOBIN: 17.3 g/dL — AB (ref 12.0–15.0)
POTASSIUM: 4.1 meq/L (ref 3.7–5.3)
SODIUM: 141 meq/L (ref 137–147)
TCO2: 25 mmol/L (ref 0–100)

## 2014-06-22 LAB — GLUCOSE, CAPILLARY: Glucose-Capillary: 153 mg/dL — ABNORMAL HIGH (ref 70–99)

## 2014-06-22 LAB — COMPREHENSIVE METABOLIC PANEL
ALBUMIN: 4 g/dL (ref 3.5–5.2)
ALK PHOS: 147 U/L — AB (ref 39–117)
ALT: 25 U/L (ref 0–35)
AST: 22 U/L (ref 0–37)
Anion gap: 16 — ABNORMAL HIGH (ref 5–15)
BUN: 13 mg/dL (ref 6–23)
CO2: 24 mEq/L (ref 19–32)
Calcium: 9.8 mg/dL (ref 8.4–10.5)
Chloride: 101 mEq/L (ref 96–112)
Creatinine, Ser: 0.51 mg/dL (ref 0.50–1.10)
GFR calc Af Amer: >90 mL/min (ref >90)
GFR calc non Af Amer: 88 mL/min — ABNORMAL LOW (ref >90)
Glucose, Bld: 178 mg/dL — ABNORMAL HIGH (ref 70–99)
POTASSIUM: 4.3 meq/L (ref 3.7–5.3)
SODIUM: 141 meq/L (ref 137–147)
TOTAL PROTEIN: 8.2 g/dL (ref 6.0–8.3)
Total Bilirubin: 0.3 mg/dL (ref 0.3–1.2)

## 2014-06-22 LAB — URINALYSIS, ROUTINE W REFLEX MICROSCOPIC
Bilirubin Urine: NEGATIVE
Glucose, UA: NEGATIVE mg/dL
HGB URINE DIPSTICK: NEGATIVE
Ketones, ur: NEGATIVE mg/dL
Leukocytes, UA: NEGATIVE
Nitrite: NEGATIVE
Protein, ur: NEGATIVE mg/dL
SPECIFIC GRAVITY, URINE: 1.006 (ref 1.005–1.030)
UROBILINOGEN UA: 0.2 mg/dL (ref 0.0–1.0)
pH: 7 (ref 5.0–8.0)

## 2014-06-22 LAB — CBC
HCT: 46.7 % — ABNORMAL HIGH (ref 36.0–46.0)
Hemoglobin: 15.1 g/dL — ABNORMAL HIGH (ref 12.0–15.0)
MCH: 25.2 pg — AB (ref 26.0–34.0)
MCHC: 32.3 g/dL (ref 30.0–36.0)
MCV: 78 fL (ref 78.0–100.0)
PLATELETS: 183 10*3/uL (ref 150–400)
RBC: 5.99 MIL/uL — AB (ref 3.87–5.11)
RDW: 15.9 % — ABNORMAL HIGH (ref 11.5–15.5)
WBC: 6.7 10*3/uL (ref 4.0–10.5)

## 2014-06-22 LAB — DIFFERENTIAL
BASOS PCT: 0 % (ref 0–1)
Basophils Absolute: 0 10*3/uL (ref 0.0–0.1)
EOS ABS: 0.1 10*3/uL (ref 0.0–0.7)
Eosinophils Relative: 2 % (ref 0–5)
Lymphocytes Relative: 20 % (ref 12–46)
Lymphs Abs: 1.3 10*3/uL (ref 0.7–4.0)
Monocytes Absolute: 0.4 10*3/uL (ref 0.1–1.0)
Monocytes Relative: 7 % (ref 3–12)
NEUTROS ABS: 4.9 10*3/uL (ref 1.7–7.7)
NEUTROS PCT: 72 % (ref 43–77)

## 2014-06-22 LAB — PROTIME-INR
INR: 0.98 (ref 0.00–1.49)
PROTHROMBIN TIME: 13.1 s (ref 11.6–15.2)

## 2014-06-22 LAB — I-STAT TROPONIN, ED: Troponin i, poc: 0 ng/mL (ref 0.00–0.08)

## 2014-06-22 LAB — CBG MONITORING, ED: Glucose-Capillary: 191 mg/dL — ABNORMAL HIGH (ref 70–99)

## 2014-06-22 MED ORDER — INSULIN ASPART 100 UNIT/ML ~~LOC~~ SOLN
0.0000 [IU] | Freq: Three times a day (TID) | SUBCUTANEOUS | Status: DC
  Administered 2014-06-23 – 2014-06-24 (×4): 2 [IU] via SUBCUTANEOUS

## 2014-06-22 MED ORDER — HYDRALAZINE HCL 20 MG/ML IJ SOLN: 10.0000 mg | INTRAMUSCULAR | Status: DC | PRN

## 2014-06-22 MED ORDER — HEPARIN SODIUM (PORCINE) 5000 UNIT/ML IJ SOLN
5000.0000 [IU] | Freq: Three times a day (TID) | INTRAMUSCULAR | Status: DC
  Administered 2014-06-22 – 2014-06-24 (×5): 5000 [IU] via SUBCUTANEOUS
  Filled 2014-06-22 (×4): qty 1

## 2014-06-22 MED ORDER — ACETAMINOPHEN 650 MG RE SUPP: 650.0000 mg | Freq: Four times a day (QID) | RECTAL | Status: DC | PRN

## 2014-06-22 MED ORDER — ACETAMINOPHEN 325 MG PO TABS: 650.0000 mg | ORAL_TABLET | Freq: Four times a day (QID) | ORAL | Status: DC | PRN

## 2014-06-22 MED ORDER — SODIUM CHLORIDE 0.9 % IJ SOLN
3.0000 mL | Freq: Two times a day (BID) | INTRAMUSCULAR | Status: DC
  Administered 2014-06-23 – 2014-06-24 (×3): 3 mL via INTRAVENOUS

## 2014-06-22 MED ORDER — ONDANSETRON HCL 4 MG/2ML IJ SOLN: 4.0000 mg | Freq: Four times a day (QID) | INTRAMUSCULAR | Status: DC | PRN

## 2014-06-22 MED ORDER — ONDANSETRON HCL 4 MG PO TABS: 4.0000 mg | ORAL_TABLET | Freq: Four times a day (QID) | ORAL | Status: DC | PRN

## 2014-06-22 MED ORDER — INSULIN ASPART 100 UNIT/ML ~~LOC~~ SOLN: 0.0000 [IU] | Freq: Every day | SUBCUTANEOUS | Status: DC

## 2014-06-22 NOTE — Progress Notes (Signed)
VASCULAR LAB PRELIMINARY  PRELIMINARY  PRELIMINARY  PRELIMINARY  Carotid duplex  completed.    Preliminary report:  Bilateral:  1-39% ICA stenosis.  Vertebral artery flow is antegrade.      Jaxten Brosh, RVT 06/22/2014, 5:15 PM

## 2014-06-22 NOTE — ED Notes (Signed)
Pt to vascular imaging at this time.

## 2014-06-22 NOTE — H&P (Signed)
[facility] History and Physical  Julia [name]:[id number] DOB: 1932-07-06 DOA: 06/22/2014  Referring physician: Emergency Department PCP: Willey Blade, MD  Specialists:   Chief Complaint: L facial numbness  HPI: Julia [name] is a 80 y.o. female  With a hx of cystic fibrosis with baseline B LE weakness, HTN, DM2, sarcoidosis who presents to the ED with numbness to the L face that started upon awakening around 7am. Symptoms did not resolve and the patient called her PCP office who recommended ED visit. In the ED, CT head was unremarkable. Neurology was consulted with recommendations for admission for stroke work up. Currently, pt still has residual L sided facial numbness, albeit somewhat improved compared to initial onset. Hospitalist since consulted for consideration for admission.  Review of Systems:  Per above, the remainder of the 10pt ros reviewed and are neg  Past Medical History  Diagnosis Date  . Essential hypertension, malignant   . Type II or unspecified type diabetes mellitus without mention of complication, not stated as uncontrolled   . Dizziness and giddiness   . Unspecified vitamin D deficiency   . Sarcoidosis   . Cerebral palsy   . Hypokalemia    Past Surgical History  Procedure Laterality Date  . Cerebral palsy surgeries    . Cesarean section    . Abdominal hysterectomy    . Lung removal, partial     Social History:  reports that she has never smoked. She has never used smokeless tobacco. She reports that she does not drink alcohol or use illicit drugs.  where does patient live--home, ALF, SNF? and with whom if at home?  Can patient participate in ADLs?  Allergies  Allergen Reactions  . Penicillins     Family History  Problem Relation Age of Onset  . Hypertension Mother   . Stroke Mother   . Cancer Father     (be sure to complete)  Prior to Admission medications   Medication Sig Start Date End Date Taking? Authorizing Provider  ALPRAZolam  (XANAX) 0.25 MG tablet Take 0.25 mg by mouth as needed.    Historical Provider, MD  amLODipine (NORVASC) 10 MG tablet Take 10 mg by mouth daily.      Historical Provider, MD  aspirin 81 MG tablet Take 81 mg by mouth daily.      Historical Provider, MD  azelastine (ASTELIN) 137 MCG/SPRAY nasal spray 1 spray by Nasal route. Use in each nostril as directed     Historical Provider, MD  Cholecalciferol (VITAMIN D PO) Take 1,000 Units by mouth daily.      Historical Provider, MD  glipiZIDE (GLUCOTROL) 5 MG tablet Take 5 mg by mouth daily.      Historical Provider, MD  latanoprost (XALATAN) 0.005 % ophthalmic solution Place 1 drop into both eyes at bedtime.     Historical Provider, MD  losartan (COZAAR) 25 MG tablet Take 25 mg by mouth daily.    Historical Provider, MD  meclizine (ANTIVERT) 12.5 MG tablet Take 12.5 mg by mouth 3 (three) times daily as needed.      Historical Provider, MD  Multiple Vitamin (MULTIVITAMIN) tablet Take 1 tablet by mouth daily.      Historical Provider, MD   Physical Exam: Filed Vitals:   06/22/14 1352 06/22/14 1400 06/22/14 1403 06/22/14 1440  BP: 187/91 175/92  184/81  Pulse: 103 103  100  Temp: 98.7 F (37.1 C)  98.7 F (37.1 C)   TempSrc: Oral     Resp:  22 22  18   Height: 5' (1.524 m)     Weight: 55.339 kg (122 lb)     SpO2: 96% 97%  99%     General:  Awake, in nad  Eyes: PERRL B  ENT: membranes moist, dentition fair  Neck: trachea midline, neck supple  Cardiovascular: regular, s1, s2  Respiratory: normal resp effort, no wheezing  Abdomen: soft,nondistended  Skin: normal skin turgor, no abnormal skin lesions seen  Musculoskeletal: perfused, no clubbing  Psychiatric: mood/affect normal// no auditory/visual hallucinations  Neurologic: L sided facial numbness, 5/5 B UE strength, 4/5 strength B LE weakness, sensation otherwise intact  Labs on Admission:  Basic Metabolic Panel:  Recent Labs Lab 06/22/14 1321 06/22/14 1334  NA 141 141  K  4.3 4.1  CL 101 104  CO2 24  --   GLUCOSE 178* 184*  BUN 13 14  CREATININE 0.51 0.60  CALCIUM 9.8  --    Liver Function Tests:  Recent Labs Lab 06/22/14 1321  AST 22  ALT 25  ALKPHOS 147*  BILITOT 0.3  PROT 8.2  ALBUMIN 4.0   No results for input(s): LIPASE, AMYLASE in the last 168 hours. No results for input(s): AMMONIA in the last 168 hours. CBC:  Recent Labs Lab 06/22/14 1321 06/22/14 1334  WBC 6.7  --   NEUTROABS 4.9  --   HGB 15.1* 17.3*  HCT 46.7* 51.0*  MCV 78.0  --   PLT 183  --    Cardiac Enzymes: No results for input(s): CKTOTAL, CKMB, CKMBINDEX, TROPONINI in the last 168 hours.  BNP (last 3 results) No results for input(s): PROBNP in the last 8760 hours. CBG: No results for input(s): GLUCAP in the last 168 hours.  Radiological Exams on Admission: Ct Head Wo Contrast  06/22/2014   ADDENDUM REPORT: 06/22/2014 14:02  ADDENDUM: These results were called by telephone at the time of interpretation on 06/22/2014 at 2:02 pm to Dr. Bethann BerkshireJOSEPH ZAMMIT , who verbally acknowledged these results.   Electronically Signed   By: Alcide CleverMark  Lukens M.D.   On: 06/22/2014 14:02   06/22/2014   CLINICAL DATA:  Left facial numbness since this morning  EXAM: CT HEAD WITHOUT CONTRAST  TECHNIQUE: Contiguous axial images were obtained from the base of the skull through the vertex without intravenous contrast.  COMPARISON:  05/17/2010  FINDINGS: Bony calvarium is intact. No gross soft tissue abnormality is noted. Mild atrophic changes are noted. Chronic white matter ischemic change is again seen. No findings to suggest acute hemorrhage, acute infarction or space-occupying mass lesion are noted.  IMPRESSION: Chronic atrophic and ischemic changes without acute abnormality.  Electronically Signed: By: Alcide CleverMark  Lukens M.D. On: 06/22/2014 13:37    Assessment/Plan Principal Problem:   Facial numbness Active Problems:   TIA (transient ischemic attack)   HTN (hypertension)   DM type 2 without  retinopathy   Left jaw numbness   Cystic fibrosis   1. Facial numbness 1. Concerns for possible CVA/TIA 2. Neurology consulted and are following 3. CT head unremarkable 4. Will follow up on MRI/MRA head, 2d echo, carotid dopplers 5. Will consult SLP 6. Admit to med-tele 2. HTN 1. Suboptimal 2. At home on amlodipine and cozaar 3. Will continue for now 4. Add PRN hydralazine for sbp >190 3. Cystic fibrosis 1. Pt with baseline 4/5 B LE weakness 2. Consult PT/OT 4. DM2 1. On glipizide prior to admit 2. Will continue on SSI coverage 5. DVT prophylaxis 1. Heparin subQ  Code Status: Full  Family Communication: Pt in room Disposition Plan: Admit to med-tele   Time spent: 35min  Abbigayle Toole, Scheryl MartenSTEPHEN K [facility] Pager [phone]  If 7PM-7AM, please contact night-coverage [email or web address] Password [email or web address] 06/22/2014, 3:45 PM

## 2014-06-22 NOTE — Consult Note (Signed)
Referring Physician: Estell Harpin    Chief Complaint: Code Stroke  HPI:                                                                                                                                         Julia [name] is an 80 y.o. female with history of CP, walks with bilateral canes, at baseline unable to lift bilateral legs off the bed. She went to sleep last night at 2200 hours and felt normal. Upon waking this AM at 0700 hours she noted left facial numbness but no other symptoms. Patient came to ED due to this Sx. On arrival a code stroke was called. Patient was brought to CT scanner and found to have no mass or bleed. Currently she continues to endorse left lower facial numbness and no other symptoms. tPA was not given secondary to being out of window and minimal symptoms.   Date last known well: Date: 06/21/2014 Time last known well: Time: 22:00 tPA Given: No: minimal symptoms and out of window.   Past Medical History  Diagnosis Date  . Essential hypertension, malignant   . Type II or unspecified type diabetes mellitus without mention of complication, not stated as uncontrolled   . Dizziness and giddiness   . Unspecified vitamin D deficiency   . Sarcoidosis   . Cerebral palsy   . Hypokalemia     Past Surgical History  Procedure Laterality Date  . Cerebral palsy surgeries    . Cesarean section    . Abdominal hysterectomy    . Lung removal, partial      Family History  Problem Relation Age of Onset  . Hypertension Mother   . Stroke Mother   . Cancer Father    Social History:  reports that she has never smoked. She has never used smokeless tobacco. She reports that she does not drink alcohol or use illicit drugs.  Allergies:  Allergies  Allergen Reactions  . Penicillins     Medications:                                                                                                                           No current facility-administered medications for this  encounter.   Current Outpatient Prescriptions  Medication Sig Dispense Refill  . ALPRAZolam (XANAX) 0.25 MG tablet Take 0.25 mg by mouth as needed.    Marland Kitchen  amLODipine (NORVASC) 10 MG tablet Take 10 mg by mouth daily.      Marland Kitchen. aspirin 81 MG tablet Take 81 mg by mouth daily.      Marland Kitchen. azelastine (ASTELIN) 137 MCG/SPRAY nasal spray 1 spray by Nasal route. Use in each nostril as directed     . Cholecalciferol (VITAMIN D PO) Take 1,000 Units by mouth daily.      Marland Kitchen. glipiZIDE (GLUCOTROL) 5 MG tablet Take 5 mg by mouth daily.      Marland Kitchen. latanoprost (XALATAN) 0.005 % ophthalmic solution Place 1 drop into both eyes at bedtime.     Marland Kitchen. losartan (COZAAR) 25 MG tablet Take 25 mg by mouth daily.    . meclizine (ANTIVERT) 12.5 MG tablet Take 12.5 mg by mouth 3 (three) times daily as needed.      . Multiple Vitamin (MULTIVITAMIN) tablet Take 1 tablet by mouth daily.         ROS:                                                                                                                                       History obtained from the patient  General ROS: negative for - chills, fatigue, fever, night sweats, weight gain or weight loss Psychological ROS: negative for - behavioral disorder, hallucinations, memory difficulties, mood swings or suicidal ideation Ophthalmic ROS: negative for - blurry vision, double vision, eye pain or loss of vision ENT ROS: negative for - epistaxis, nasal discharge, oral lesions, sore throat, tinnitus or vertigo Allergy and Immunology ROS: negative for - hives or itchy/watery eyes Hematological and Lymphatic ROS: negative for - bleeding problems, bruising or swollen lymph nodes Endocrine ROS: negative for - galactorrhea, hair pattern changes, polydipsia/polyuria or temperature intolerance Respiratory ROS: negative for - cough, hemoptysis, shortness of breath or wheezing Cardiovascular ROS: negative for - chest pain, dyspnea on exertion, edema or irregular heartbeat Gastrointestinal ROS:  negative for - abdominal pain, diarrhea, hematemesis, nausea/vomiting or stool incontinence Genito-Urinary ROS: negative for - dysuria, hematuria, incontinence or urinary frequency/urgency Musculoskeletal ROS: negative for - joint swelling or muscular weakness Neurological ROS: as noted in HPI Dermatological ROS: negative for rash and skin lesion changes  Neurologic Examination:                                                                                                      Blood pressure 166/83, pulse 99, temperature 98.1 F (36.7 C), temperature source Oral, resp. rate 22, SpO2 99 %.  HEENT-  Normocephalic, no lesions, without obvious abnormality.  Normal external eye and conjunctiva.  Normal TM's bilaterally.  Normal auditory canals and external ears. Normal external nose, mucus membranes and septum.  Normal pharynx. Cardiovascular- regular rate and rhythm, S1, S2 normal, no murmur, click, rub or gallop, pulses palpable throughout   Lungs- chest clear, no wheezing, rales, normal symmetric air entry Abdomen- soft, non-tender; bowel sounds normal; no masses,  no organomegaly Extremities- less then 2 second capillary refill Lymph-no adenopathy palpable Musculoskeletal-no joint tenderness, deformity or swelling, abnormal active range of motion of bilateral legs--history of CP and shows decreased muscle mass and inability to lift antigravity bilaterally Skin-warm and dry, no hyperpigmentation, vitiligo, or suspicious lesions  Neurological Examination Mental Status: Alert, oriented, thought content appropriate.  Speech fluent without evidence of aphasia.  Able to follow 3 step commands without difficulty. Cranial Nerves: II: Discs flat bilaterally; Visual fields grossly normal, pupils equal, round, reactive to light and accommodation III,IV, VI: ptosis not present, extra-ocular motions intact bilaterally V,VII: smile symmetric, facial light touch sensation decreased on the left lower  face VIII: hearing normal bilaterally IX,X: gag reflex present XI: bilateral shoulder shrug XII: midline tongue extension Motor: Right : Upper extremity   5/5    Left:     Upper extremity   5/5  Lower extremity   2/5     Lower extremity   2/5 --bilateral LE weakness is old secondary to CP.  Bilateral LE also has decreased muscle mass.   Sensory: Pinprick and light touch intact throughout, bilaterally Deep Tendon Reflexes: 2+ and symmetric throughout UE no KJ or AJ noted bilaterally Plantars: Right: downgoing   Left: up going Cerebellar: normal finger-to-nose,unable to assess  heel-to-shin due to underlying baseline weakness.  Gait: not tested.        Lab Results: Basic Metabolic Panel:  Recent Labs Lab 06/22/14 1334  NA 141  K 4.1  CL 104  GLUCOSE 184*  BUN 14  CREATININE 0.60    Liver Function Tests: No results for input(s): AST, ALT, ALKPHOS, BILITOT, PROT, ALBUMIN in the last 168 hours. No results for input(s): LIPASE, AMYLASE in the last 168 hours. No results for input(s): AMMONIA in the last 168 hours.  CBC:  Recent Labs Lab 06/22/14 1321 06/22/14 1334  WBC 6.7  --   NEUTROABS 4.9  --   HGB 15.1* 17.3*  HCT 46.7* 51.0*  MCV 78.0  --   PLT 183  --     Cardiac Enzymes: No results for input(s): CKTOTAL, CKMB, CKMBINDEX, TROPONINI in the last 168 hours.  Lipid Panel: No results for input(s): CHOL, TRIG, HDL, CHOLHDL, VLDL, LDLCALC in the last 168 hours.  CBG: No results for input(s): GLUCAP in the last 168 hours.  Microbiology: Results for orders placed or performed during the hospital encounter of 10/31/10  Urine culture     Status: None   Collection Time: 11/01/10  1:49 AM  Result Value Ref Range Status   Specimen Description URINE, RANDOM  Final   Special Requests NONE  Final   Culture  Setup Time [id number]  Final   Colony Count NO GROWTH  Final   Culture NO GROWTH  Final   Report Status 11/02/2010 FINAL  Final    Coagulation  Studies: No results for input(s): LABPROT, INR in the last 72 hours.  Imaging: Ct Head Wo Contrast  06/22/2014   CLINICAL DATA:  Left facial numbness since this morning  EXAM: CT HEAD WITHOUT CONTRAST  TECHNIQUE:  Contiguous axial images were obtained from the base of the skull through the vertex without intravenous contrast.  COMPARISON:  05/17/2010  FINDINGS: Bony calvarium is intact. No gross soft tissue abnormality is noted. Mild atrophic changes are noted. Chronic white matter ischemic change is again seen. No findings to suggest acute hemorrhage, acute infarction or space-occupying mass lesion are noted.  IMPRESSION: Chronic atrophic and ischemic changes without acute abnormality.   Electronically Signed   By: Alcide Clever M.D.   On: 06/22/2014 13:37    Felicie Morn PA-C [facility] [phone]  06/22/2014, 1:46 PM   Patient seen and examined.  Clinical course and management discussed.  Necessary edits performed.  I agree with the above.  Assessment and plan of care developed and discussed below.     Assessment: 80 y.o. female with left facial decreased sensation upon waking this AM.  With no resolution of symptoms patient presented for evaluation.  Head CT personally reviewed and shows no acute changes.  Patient with multiple vascular risk factors.  On ASA at home.  Further work up recommended.    Stroke Risk Factors - diabetes mellitus and hypertension  Recommendations: 1. HgbA1c, fasting lipid panel 2. MRI, MRA  of the brain without contrast 3. PT consult, OT consult, Speech consult 4. Echocardiogram 5. Carotid dopplers 6. Prophylactic therapy-Antiplatelet med: Aspirin - dose 325mg  daily 7. NPO until RN stroke swallow screen 8. Telemetry monitoring 9. Frequent neuro checks    Thana Farr, MD [facility] [phone]  06/22/2014  3:05 PM

## 2014-06-22 NOTE — Progress Notes (Addendum)
Pt arrived to floor and this time. A&O; denies pain; incontinent; changed and partial bath.  In bed with alarm set. Pt has pocket book with her and cell phone.  Encouraged patient to send any valuables home, if she has them, with family.

## 2014-06-22 NOTE — ED Notes (Signed)
Code Stroke called per Dr. Zammit 

## 2014-06-22 NOTE — Progress Notes (Signed)
  Echocardiogram 2D Echocardiogram has been performed.  Julia [name], Julia [name] 06/22/2014, 5:37 PM

## 2014-06-22 NOTE — Code Documentation (Signed)
80[facility] [id number] private [facility].  Patient reports that she was at her baseline last night at 2200 when she went to bed.  She woke up at 0700 and reports left facial numbness.  Patient to the ED, Code Stroke activated.  Patient to CT, stroke team at the bedside.  NIHSS 7, see documentation for details and code stroke times.  Patient with a h/o of cerebral palsy and baseline bilateral lower extremity weakness.  Patient continues to report left facial numbness.  Patient is outside the window for treatment with tPA at this time.  No acute stroke treatment at this time.  Bedside handoff with ED RN Lanora ManisElizabeth.

## 2014-06-22 NOTE — ED Notes (Signed)
Pt presents to department for evaluation of L sided facial numbness, onset this morning after waking up, around 7am. Pt able to move all extremities in triage, strong equal bilateral grip strengths, no facial droop noted, speech clear. Denies pain at present. Pt is alert and oriented x4.

## 2014-06-22 NOTE — ED Provider Notes (Signed)
CSN: [id number]     Arrival date & time 06/22/14  1306 History   First MD Initiated Contact with Patient 06/22/14 1329     Chief Complaint  Patient presents with  . Numbness    An emergency department physician performed an initial assessment on this suspected stroke patient at 48. (Consider location/radiation/quality/duration/timing/severity/associated sxs/prior Treatment) Patient is a 80 y.o. female presenting with Acute Neurological Problem. The history is provided by the patient (the pt complains of numbness left face).  Cerebrovascular Accident This is a new problem. The current episode started 6 to 12 hours ago. The problem occurs constantly. The problem has not changed since onset.Pertinent negatives include no chest pain, no abdominal pain and no headaches. Nothing aggravates the symptoms. Nothing relieves the symptoms.    Past Medical History  Diagnosis Date  . Essential hypertension, malignant   . Type II or unspecified type diabetes mellitus without mention of complication, not stated as uncontrolled   . Dizziness and giddiness   . Unspecified vitamin D deficiency   . Sarcoidosis   . Cerebral palsy   . Hypokalemia    Past Surgical History  Procedure Laterality Date  . Cerebral palsy surgeries    . Cesarean section    . Abdominal hysterectomy    . Lung removal, partial     Family History  Problem Relation Age of Onset  . Hypertension Mother   . Stroke Mother   . Cancer Father    History  Substance Use Topics  . Smoking status: Never Smoker   . Smokeless tobacco: Never Used  . Alcohol Use: No   OB History    No data available     Review of Systems  Constitutional: Negative for appetite change and fatigue.  HENT: Negative for congestion, ear discharge and sinus pressure.        Left facial numbness  Eyes: Negative for discharge.  Respiratory: Negative for cough.   Cardiovascular: Negative for chest pain.  Gastrointestinal: Negative for abdominal pain  and diarrhea.  Genitourinary: Negative for frequency and hematuria.  Musculoskeletal: Negative for back pain.  Skin: Negative for rash.  Neurological: Negative for seizures and headaches.  Psychiatric/Behavioral: Negative for hallucinations.      Allergies  Penicillins  Home Medications   Prior to Admission medications   Medication Sig Start Date End Date Taking? Authorizing Provider  ALPRAZolam (XANAX) 0.25 MG tablet Take 0.25 mg by mouth as needed.    Historical Provider, MD  amLODipine (NORVASC) 10 MG tablet Take 10 mg by mouth daily.      Historical Provider, MD  aspirin 81 MG tablet Take 81 mg by mouth daily.      Historical Provider, MD  azelastine (ASTELIN) 137 MCG/SPRAY nasal spray 1 spray by Nasal route. Use in each nostril as directed     Historical Provider, MD  Cholecalciferol (VITAMIN D PO) Take 1,000 Units by mouth daily.      Historical Provider, MD  glipiZIDE (GLUCOTROL) 5 MG tablet Take 5 mg by mouth daily.      Historical Provider, MD  latanoprost (XALATAN) 0.005 % ophthalmic solution Place 1 drop into both eyes at bedtime.     Historical Provider, MD  losartan (COZAAR) 25 MG tablet Take 25 mg by mouth daily.    Historical Provider, MD  meclizine (ANTIVERT) 12.5 MG tablet Take 12.5 mg by mouth 3 (three) times daily as needed.      Historical Provider, MD  Multiple Vitamin (MULTIVITAMIN) tablet Take 1 tablet by  mouth daily.      Historical Provider, MD   BP 184/81 mmHg  Pulse 100  Temp(Src) 98.7 F (37.1 C) (Oral)  Resp 18  Ht 5' (1.524 m)  Wt 122 lb (55.339 kg)  BMI 23.83 kg/m2  SpO2 99% Physical Exam  Constitutional: She is oriented to person, place, and time. She appears well-developed.  HENT:  Head: Normocephalic.  Eyes: Conjunctivae and EOM are normal. No scleral icterus.  Neck: Neck supple. No thyromegaly present.  Cardiovascular: Normal rate and regular rhythm.  Exam reveals no gallop and no friction rub.   No murmur heard. Pulmonary/Chest: No  stridor. She has no wheezes. She has no rales. She exhibits no tenderness.  Abdominal: She exhibits no distension. There is no tenderness. There is no rebound.  Musculoskeletal: Normal range of motion. She exhibits no edema.  Lymphadenopathy:    She has no cervical adenopathy.  Neurological: She is oriented to person, place, and time. She exhibits normal muscle tone. Coordination normal.  Left facial nunbess  Skin: No rash noted. No erythema.  Psychiatric: She has a normal mood and affect. Her behavior is normal.    ED Course  Procedures (including critical care time) Labs Review Labs Reviewed  CBC - Abnormal; Notable for the following:    RBC 5.99 (*)    Hemoglobin 15.1 (*)    HCT 46.7 (*)    MCH 25.2 (*)    RDW 15.9 (*)    All other components within normal limits  COMPREHENSIVE METABOLIC PANEL - Abnormal; Notable for the following:    Glucose, Bld 178 (*)    Alkaline Phosphatase 147 (*)    GFR calc non Af Amer 88 (*)    Anion gap 16 (*)    All other components within normal limits  I-STAT CHEM 8, ED - Abnormal; Notable for the following:    Glucose, Bld 184 (*)    Hemoglobin 17.3 (*)    HCT 51.0 (*)    All other components within normal limits  PROTIME-INR  APTT  DIFFERENTIAL  URINALYSIS, ROUTINE W REFLEX MICROSCOPIC  I-STAT TROPOININ, ED    Imaging Review Ct Head Wo Contrast  06/22/2014   ADDENDUM REPORT: 06/22/2014 14:02  ADDENDUM: These results were called by telephone at the time of interpretation on 06/22/2014 at 2:02 pm to Dr. Jomarie LongsJOSEPH Elley Harp , who verbally acknowledged these results.   Electronically Signed   By: Alcide CleverMark  Lukens M.D.   On: 06/22/2014 14:02   06/22/2014   CLINICAL DATA:  Left facial numbness since this morning  EXAM: CT HEAD WITHOUT CONTRAST  TECHNIQUE: Contiguous axial images were obtained from the base of the skull through the vertex without intravenous contrast.  COMPARISON:  05/17/2010  FINDINGS: Bony calvarium is intact. No gross soft tissue  abnormality is noted. Mild atrophic changes are noted. Chronic white matter ischemic change is again seen. No findings to suggest acute hemorrhage, acute infarction or space-occupying mass lesion are noted.  IMPRESSION: Chronic atrophic and ischemic changes without acute abnormality.  Electronically Signed: By: Alcide CleverMark  Lukens M.D. On: 06/22/2014 13:37     EKG Interpretation   Date/Time:  Tuesday June 22 2014 13:42:33 EST Ventricular Rate:  99 PR Interval:  178 QRS Duration: 137 QT Interval:  390 QTC Calculation: 500 R Axis:   -78 Text Interpretation:  Sinus rhythm LAE, consider biatrial enlargement RBBB  and LAFB Left ventricular hypertrophy Inferior infarct, acute ST  elevation, consider anterior injury Lateral leads are also involved  Confirmed by  Zivah Mayr  MD, Jomarie LongsJOSEPH [phone]([id number]) on 06/22/2014 3:04:24 PM      MDM   Final diagnoses:  H/O TIA (transient ischemic attack) and stroke    Admit for stroke work up    Benny LennertJoseph L Billye Pickerel, MD 06/22/14 [phone]

## 2014-06-23 DIAGNOSIS — G459 Transient cerebral ischemic attack, unspecified: Secondary | ICD-10-CM | POA: Diagnosis not present

## 2014-06-23 LAB — COMPREHENSIVE METABOLIC PANEL
ALBUMIN: 3.3 g/dL — AB (ref 3.5–5.2)
ALT: 22 U/L (ref 0–35)
ANION GAP: 13 (ref 5–15)
AST: 22 U/L (ref 0–37)
Alkaline Phosphatase: 125 U/L — ABNORMAL HIGH (ref 39–117)
BILIRUBIN TOTAL: 0.3 mg/dL (ref 0.3–1.2)
BUN: 16 mg/dL (ref 6–23)
CO2: 24 mEq/L (ref 19–32)
CREATININE: 0.54 mg/dL (ref 0.50–1.10)
Calcium: 9.3 mg/dL (ref 8.4–10.5)
Chloride: 105 mEq/L (ref 96–112)
GFR calc Af Amer: >90 mL/min (ref >90)
GFR calc non Af Amer: 87 mL/min — ABNORMAL LOW (ref >90)
Glucose, Bld: 138 mg/dL — ABNORMAL HIGH (ref 70–99)
Potassium: 3.8 mEq/L (ref 3.7–5.3)
Sodium: 142 mEq/L (ref 137–147)
TOTAL PROTEIN: 7.2 g/dL (ref 6.0–8.3)

## 2014-06-23 LAB — LIPID PANEL
CHOLESTEROL: 111 mg/dL (ref 0–200)
HDL: 59 mg/dL (ref >39)
LDL CALC: 34 mg/dL (ref 0–99)
TRIGLYCERIDES: 90 mg/dL (ref <150)
Total CHOL/HDL Ratio: 1.9 RATIO
VLDL: 18 mg/dL (ref 0–40)

## 2014-06-23 LAB — GLUCOSE, CAPILLARY
GLUCOSE-CAPILLARY: 130 mg/dL — AB (ref 70–99)
GLUCOSE-CAPILLARY: 125 mg/dL — AB (ref 70–99)
GLUCOSE-CAPILLARY: 143 mg/dL — AB (ref 70–99)
Glucose-Capillary: 121 mg/dL — ABNORMAL HIGH (ref 70–99)

## 2014-06-23 LAB — HEMOGLOBIN A1C
Hgb A1c MFr Bld: 6.6 % — ABNORMAL HIGH (ref <5.7)
MEAN PLASMA GLUCOSE: 143 mg/dL — AB (ref <117)

## 2014-06-23 LAB — CBC
HEMATOCRIT: 42.2 % (ref 36.0–46.0)
HEMOGLOBIN: 13.8 g/dL (ref 12.0–15.0)
MCH: 25.4 pg — ABNORMAL LOW (ref 26.0–34.0)
MCHC: 32.7 g/dL (ref 30.0–36.0)
MCV: 77.6 fL — ABNORMAL LOW (ref 78.0–100.0)
Platelets: 186 10*3/uL (ref 150–400)
RBC: 5.44 MIL/uL — ABNORMAL HIGH (ref 3.87–5.11)
RDW: 16.1 % — AB (ref 11.5–15.5)
WBC: 6.1 10*3/uL (ref 4.0–10.5)

## 2014-06-23 MED ORDER — ASPIRIN EC 325 MG PO TBEC
325.0000 mg | DELAYED_RELEASE_TABLET | Freq: Every day | ORAL | Status: DC
  Administered 2014-06-23 – 2014-06-24 (×2): 325 mg via ORAL
  Filled 2014-06-23 (×2): qty 1

## 2014-06-23 NOTE — Progress Notes (Signed)
UR completed 

## 2014-06-23 NOTE — Progress Notes (Signed)
OT Cancellation Note  Patient Details Name: Julia SidleVina N [name] MRN: [id number] DOB: 12-Dec-1931   Cancelled Treatment:    Reason Eval/Treat Not Completed: Other (comment) (Family has still not brought pt's Lofstrand crutches. OT will hold evaluation to accurately assess pt's status when crutches are present since the patient lives alone).  Nils PyleBermel, Caidance Sybert 06/23/2014, 3:21 PM

## 2014-06-23 NOTE — Progress Notes (Signed)
[facility] PROGRESS NOTE  MIREL HUNDAL ZOX:[id number] DOB: 09-14-1931 DOA: 06/22/2014  PCP: Willey Blade, MD  Brief HPI: 80 year old female with history of cerebral palsy who walks around, using crutches and is otherwise wheelchair bound but fairly independent at home and presented with left-sided facial numbness. She was admitted for further workup.  Past medical history:  Past Medical History  Diagnosis Date  . Essential hypertension, malignant   . Type II or unspecified type diabetes mellitus without mention of complication, not stated as uncontrolled   . Dizziness and giddiness   . Unspecified vitamin D deficiency   . Sarcoidosis   . Cerebral palsy   . Hypokalemia     Consultants: Neurology  Procedures:  Carotid Doppler. Pending.  2-D echocardiogram. Pending  Antibiotics: None  Subjective: Patient still has some residual left-sided numbness. However, it is improved.  Objective: Vital Signs  Filed Vitals:   06/23/14 0000 06/23/14 0200 06/23/14 0400 06/23/14 0600  BP: 136/63 156/68 138/52 145/59  Pulse: 74 79 71 67  Temp: 98 F (36.7 C) 97.8 F (36.6 C) 97.7 F (36.5 C) 97.9 F (36.6 C)  TempSrc: Oral Oral Oral Oral  Resp: 15 14 14 16   Height:      Weight:      SpO2: 97% 96% 96% 98%   No intake or output data in the 24 hours ending 06/23/14 8119 Filed Weights   06/22/14 1352  Weight: 55.339 kg (122 lb)    General appearance: alert, cooperative, appears stated age and no distress Resp: clear to auscultation bilaterally Cardio: regular rate and rhythm, S1, S2 normal, no murmur, click, rub or gallop GI: soft, non-tender; bowel sounds normal; no masses,  no organomegaly Neurologic: No facial asymmetry. Motor strength equal bilateral upper extremity. Not able to mobilize legs due to history of cerebral palsy.  Lab Results:  Basic Metabolic Panel:  Recent Labs Lab 06/22/14 1321 06/22/14 1334 06/23/14 0715  NA 141 141 142  K 4.3 4.1 3.8    CL 101 104 105  CO2 24  --  24  GLUCOSE 178* 184* 138*  BUN 13 14 16   CREATININE 0.51 0.60 0.54  CALCIUM 9.8  --  9.3   Liver Function Tests:  Recent Labs Lab 06/22/14 1321 06/23/14 0715  AST 22 22  ALT 25 22  ALKPHOS 147* 125*  BILITOT 0.3 0.3  PROT 8.2 7.2  ALBUMIN 4.0 3.3*   CBC:  Recent Labs Lab 06/22/14 1321 06/22/14 1334 06/23/14 0715  WBC 6.7  --  6.1  NEUTROABS 4.9  --   --   HGB 15.1* 17.3* 13.8  HCT 46.7* 51.0* 42.2  MCV 78.0  --  77.6*  PLT 183  --  186   CBG:  Recent Labs Lab 06/22/14 1750 06/22/14 2147 06/23/14 0650  GLUCAP 191* 153* 130*    Studies/Results: Ct Head Wo Contrast  06/22/2014   ADDENDUM REPORT: 06/22/2014 14:02  ADDENDUM: These results were called by telephone at the time of interpretation on 06/22/2014 at 2:02 pm to Dr. Jomarie Longs ZAMMIT , who verbally acknowledged these results.   Electronically Signed   By: Alcide Clever M.D.   On: 06/22/2014 14:02   06/22/2014   CLINICAL DATA:  Left facial numbness since this morning  EXAM: CT HEAD WITHOUT CONTRAST  TECHNIQUE: Contiguous axial images were obtained from the base of the skull through the vertex without intravenous contrast.  COMPARISON:  05/17/2010  FINDINGS: Bony calvarium is intact. No gross soft tissue abnormality  is noted. Mild atrophic changes are noted. Chronic white matter ischemic change is again seen. No findings to suggest acute hemorrhage, acute infarction or space-occupying mass lesion are noted.  IMPRESSION: Chronic atrophic and ischemic changes without acute abnormality.  Electronically Signed: By: Alcide CleverMark  Lukens M.D. On: 06/22/2014 13:37   Mr Maxine GlennMra Head Wo Contrast  06/22/2014   CLINICAL DATA:  Stroke. Left facial numbness began 6 hr ago. History of cerebral palsy.  EXAM: MRI HEAD WITHOUT CONTRAST  MRA HEAD WITHOUT CONTRAST  TECHNIQUE: Multiplanar, multiecho pulse sequences of the brain and surrounding structures were obtained without intravenous contrast. Angiographic images  of the head were obtained using MRA technique without contrast.  COMPARISON:  CT head 06/22/2014  FINDINGS: MRI HEAD FINDINGS  Generalized atrophy. Negative for hydrocephalus. Image quality degraded by mild motion.  Negative for acute infarct. Moderate chronic microvascular ischemic change in the white matter and pons. Chronic left parietal infarct.  Focus of micro hemorrhage in the right occipital lobe appears chronic. Negative for mass lesion. Right temporal scalp hematoma, an incidental finding.  MRA HEAD FINDINGS  Both vertebral arteries are patent to the basilar. Right PICA patent. Left PICA not visualized. Basilar widely patent. Superior cerebellar and posterior cerebral arteries are patent bilaterally.  Internal carotid artery widely patent bilaterally.  Right anterior cerebral artery widely patent. Right middle cerebral artery widely patent. Mild atherosclerotic disease right middle cerebral artery branches  Left anterior cerebral artery widely patent. Left M1 segment widely patent. Severe stenosis of the inferior branch of the left middle cerebral artery. Mild disease in the superior branch of the left middle cerebral artery  Negative for cerebral aneurysm.  IMPRESSION: Atrophy and moderate chronic ischemic change.  No acute infarct  Intracranial atherosclerotic disease in the middle cerebral arteries left greater than right.   Electronically Signed   By: Marlan Palauharles  Clark M.D.   On: 06/22/2014 19:18   Mr Brain Wo Contrast  06/22/2014   CLINICAL DATA:  Stroke. Left facial numbness began 6 hr ago. History of cerebral palsy.  EXAM: MRI HEAD WITHOUT CONTRAST  MRA HEAD WITHOUT CONTRAST  TECHNIQUE: Multiplanar, multiecho pulse sequences of the brain and surrounding structures were obtained without intravenous contrast. Angiographic images of the head were obtained using MRA technique without contrast.  COMPARISON:  CT head 06/22/2014  FINDINGS: MRI HEAD FINDINGS  Generalized atrophy. Negative for hydrocephalus.  Image quality degraded by mild motion.  Negative for acute infarct. Moderate chronic microvascular ischemic change in the white matter and pons. Chronic left parietal infarct.  Focus of micro hemorrhage in the right occipital lobe appears chronic. Negative for mass lesion. Right temporal scalp hematoma, an incidental finding.  MRA HEAD FINDINGS  Both vertebral arteries are patent to the basilar. Right PICA patent. Left PICA not visualized. Basilar widely patent. Superior cerebellar and posterior cerebral arteries are patent bilaterally.  Internal carotid artery widely patent bilaterally.  Right anterior cerebral artery widely patent. Right middle cerebral artery widely patent. Mild atherosclerotic disease right middle cerebral artery branches  Left anterior cerebral artery widely patent. Left M1 segment widely patent. Severe stenosis of the inferior branch of the left middle cerebral artery. Mild disease in the superior branch of the left middle cerebral artery  Negative for cerebral aneurysm.  IMPRESSION: Atrophy and moderate chronic ischemic change.  No acute infarct  Intracranial atherosclerotic disease in the middle cerebral arteries left greater than right.   Electronically Signed   By: Marlan Palauharles  Clark M.D.   On: 06/22/2014 19:18  Medications:  Scheduled: . aspirin EC  325 mg Oral Daily  . heparin  5,000 Units Subcutaneous 3 times per day  . insulin aspart  0-15 Units Subcutaneous TID WC  . insulin aspart  0-5 Units Subcutaneous QHS  . sodium chloride  3 mL Intravenous Q12H   Continuous:  RUE:[id number]:acetaminophen **OR** acetaminophen, hydrALAZINE, ondansetron **OR** ondansetron (ZOFRAN) IV  Assessment/Plan:  Principal Problem:   Facial numbness Active Problems:   TIA (transient ischemic attack)   HTN (hypertension)   DM type 2 without retinopathy   Left jaw numbness   Cystic fibrosis    Left Facial numbness MRI negative for stroke. Discussed with the stroke team and they did not think TIA  is a possibility as well. However, they would like further workup to be completed. We await echocardiogram and carotid Doppler. Await PT and OT evaluation. If workup has been completed she could be discharged home later today. If not, she'll have to wait or tomorrow morning. PT would like to walk her using her crutches. We await for her crutches to come from home. Continue with aspirin.   Essential HTN Suboptimal. Continue home medications.  DM2 Continue current medications.   History of cerebral palsy She has chronic lower extremity weakness. She uses crutches to ambulate in the house. Uses wheelchair rest of the time. She however is quite independent at home.  DVT Prophylaxis: Heparin    Code Status: Full code  Family Communication: Discussed with the patient  Disposition Plan: Await workup to be completed.    LOS: 1 day   [facility] LLCKRISHNAN,Ben Habermann  [facility] Pager [phone] 06/23/2014, 8:38 AM  If 8PM-8AM, please contact night-coverage at [email or web address], password [email or web address]

## 2014-06-23 NOTE — Progress Notes (Signed)
Physical Therapy Treatment Patient Details Name: Julia SidleVina N [name] MRN: [id number] DOB: [birth year] Today's Date: 06/23/2014    History of Present Illness 80 yo female admitted with L facial and side numbness. Pt walks with bil canes NIH=7 MRI(-) PMH: CP, DMII, dizziness, HTN, partial lung removal, multiple surgeries for cerebral palsy    PT Comments    Pt seen for second PT session today to assess gt and safety with D/C home. Recommend HHPT for additional therapy upon D/C home. Pt at min guard level to mobilize and must be at supervision to mod I to safely D/C home. Pt hopeful to D/C home tomorrow.   Follow Up Recommendations  Home health PT;Supervision - Intermittent     Equipment Recommendations  None recommended by PT    Recommendations for Other Services       Precautions / Restrictions Precautions Precautions: Fall Restrictions Weight Bearing Restrictions: No    Mobility  Bed Mobility               General bed mobility comments: pt up in chair and returned to chair  Transfers Overall transfer level: Needs assistance Equipment used: Lofstrands Transfers: Sit to/from Stand Sit to Stand: Min guard Stand pivot transfers: Min guard       General transfer comment: min guard to steady with sit to stand while pt adjusts loftstrands   Ambulation/Gait Ambulation/Gait assistance: Min guard Ambulation Distance (Feet): 20 Feet Assistive device: Lofstrands Gait Pattern/deviations: Narrow base of support;Trunk flexed;Decreased stride length;Shuffle Gait velocity: decreased Gait velocity interpretation: Below normal speed for age/gender General Gait Details: pt ambulating with loftstrands; min guard to steady; cues for safe technique    Stairs            Wheelchair Mobility    Modified Rankin (Stroke Patients Only) Modified Rankin (Stroke Patients Only) Pre-Morbid Rankin Score: Slight disability Modified Rankin: Slight disability     Balance Overall  balance assessment: Needs assistance Sitting-balance support: No upper extremity supported;Feet supported Sitting balance-Leahy Scale: Fair     Standing balance support: During functional activity;Bilateral upper extremity supported Standing balance-Leahy Scale: Poor Standing balance comment: bil UE support and min guard to steady                    Cognition Arousal/Alertness: Awake/alert Behavior During Therapy: WFL for tasks assessed/performed Overall Cognitive Status: Within Functional Limits for tasks assessed                      Exercises      General Comments        Pertinent Vitals/Pain Pain Assessment: No/denies pain    Home Living Family/patient expects to be discharged to:: Private residence Living Arrangements: Alone Available Help at Discharge: Family;Available PRN/intermittently Type of Home: House Home Access: Ramped entrance   Home Layout: One level Home Equipment: Shower seat;Grab bars - toilet;Grab bars - tub/shower;Wheelchair - power;Crutches;Hand held shower head      Prior Function Level of Independence: Independent with assistive device(s)      Comments: Drives   PT Goals (current goals can now be found in the care plan section) Acute Rehab PT Goals Patient Stated Goal: go home tomorrow PT Goal Formulation: With patient Time For Goal Achievement: 06/24/14 Potential to Achieve Goals: Good Progress towards PT goals: Progressing toward goals    Frequency  Min 3X/week    PT Plan Discharge plan needs to be updated    Co-evaluation  End of Session Equipment Utilized During Treatment: Gait belt Activity Tolerance: Patient tolerated treatment well Patient left: in chair;with call bell/phone within reach;with chair alarm set     Time: [id number] PT Time Calculation (min) (ACUTE ONLY): 18 min  Charges:  $Gait Training: 8-22 mins                    G Codes:  Functional Assessment Tool Used: clinical  observation Functional Limitation: Mobility: Walking and moving around Mobility: Walking and Moving Around Current Status [phone]([id number]): At least 1 percent but less than 20 percent impaired, limited or restricted Mobility: Walking and Moving Around Goal Status [phone]([id number]): At least 1 percent but less than 20 percent impaired, limited or restricted   Donell SievertWest, Angelino Rumery N, [place] CarolinaPT  [phone] 06/23/2014, 5:00 PM

## 2014-06-23 NOTE — Evaluation (Signed)
SLP Cancellation Note  Patient Details Name: Julia [name] MRN: [id number] DOB: 24-Aug-1931   Cancelled treatment:       Reason Eval/Treat Not Completed: Other (comment) (pt was working with OT at time of SLP attempt )   Donavan Burnetamara Flor Houdeshell, MS [facility] SLP [phone]

## 2014-06-23 NOTE — Evaluation (Signed)
Physical Therapy Evaluation Patient Details Name: Julia [name] MRN: [id number] DOB: 1932-07-09 Today's Date: 06/23/2014   History of Present Illness  80 yo female admitted with L facial and side numbness. Pt walks with bil canes NIH=7 MRI(-) PMH: CP, DMII, dizziness, HTN, partial lung removal, multiple surgeries for cerebral palsy  Clinical Impression  Pt admitted with above diagnosis. Pt does not have her Lofstrand crutches here at the hospital, therefore gait assessment could not be completed. Family trying to arrange to get them here today. Anticipate she is at her baseline, however she lives alone and would prefer to see how she does with her walking.Will attempt to see later today or 12/17 (if pt is here) for further safety/gait assessment.       Follow Up Recommendations  (TBA after further gait assessment)    Equipment Recommendations  None recommended by PT    Recommendations for Other Services       Precautions / Restrictions Precautions Precautions: Fall      Mobility  Bed Mobility Overal bed mobility: Modified Independent             General bed mobility comments: HOB 0  Transfers Overall transfer level: Needs assistance Equipment used:  (armrest of chair and BSC) Transfers: Sit to/from BJ'sStand;Stand Pivot Transfers Sit to Stand: Supervision Stand pivot transfers: Supervision       General transfer comment: pt does not have her lofstrand crutches present, therefore supervision for safety; pt holding onto armrests for transfers; to [facility] to bed to chair   Ambulation/Gait             General Gait Details: not tested due to lack of lofstrands; pt called her sister and they will try to get her crutches here today  Stairs            Wheelchair Mobility    Modified Rankin (Stroke Patients Only) Modified Rankin (Stroke Patients Only) Pre-Morbid Rankin Score: Slight disability (due to CP) Modified Rankin: Slight disability     Balance Overall  balance assessment: History of Falls (reports she can get herself up if she falls)                                           Pertinent Vitals/Pain Pain Assessment: No/denies pain    Home Living Family/patient expects to be discharged to:: Private residence Living Arrangements: Alone Available Help at Discharge: Family;Available PRN/intermittently Type of Home: House Home Access: Ramped entrance     Home Layout: One level Home Equipment: Other (comment);Shower seat;Wheelchair - power (lofstrand crutches; walkin shower; )      Prior Function Level of Independence: Independent with assistive device(s)         Comments: has groceries delivered by [employer]     Hand Dominance        Extremity/Trunk Assessment   Upper Extremity Assessment: Overall WFL for tasks assessed           Lower Extremity Assessment: RLE deficits/detail;LLE deficits/detail RLE Deficits / Details: CP changes (knee contractures, pronation); uses her UEs to move legs off bed, to cross leg for donning shoe LLE Deficits / Details: CP changes (knee contractures, pronation); uses her UEs to move legs off bed, to cross leg for donning shoe  Cervical / Trunk Assessment: Other exceptions  Communication   Communication: No difficulties  Cognition Arousal/Alertness: Awake/alert Behavior During Therapy: [facility] for  tasks assessed/performed Overall Cognitive Status: Within Functional Limits for tasks assessed                      General Comments      Exercises        Assessment/Plan    PT Assessment Patient needs continued PT services (to assess gait when crutches arrive)  PT Diagnosis Difficulty walking   PT Problem List Decreased mobility  PT Treatment Interventions Gait training;Functional mobility training;Therapeutic activities;Patient/family education   PT Goals (Current goals can be found in the Care Plan section) Acute Rehab PT Goals Patient Stated Goal: go  home today PT Goal Formulation: With patient Time For Goal Achievement: 06/24/14 Potential to Achieve Goals: Good    Frequency Min 3X/week   Barriers to discharge        Co-evaluation               End of Session   Activity Tolerance: Patient tolerated treatment well Patient left: in chair;with call bell/phone within reach (chair alarm malfunctioning; NT made aware) Nurse Communication: Mobility status    Functional Assessment Tool Used: clinical observation Functional Limitation: Mobility: Walking and moving around Mobility: Walking and Moving Around Current Status ([id number]): At least 1 percent but less than 20 percent impaired, limited or restricted Mobility: Walking and Moving Around Goal Status [phone](G8979): At least 1 percent but less than 20 percent impaired, limited or restricted    Time: [id number] PT Time Calculation (min) (ACUTE ONLY): 43 min   Charges:   PT Evaluation $Initial PT Evaluation Tier I: 1 Procedure PT Treatments $Therapeutic Activity: 23-37 mins   PT G Codes:   Functional Assessment Tool Used: clinical observation Functional Limitation: Mobility: Walking and moving around    Mikal Blasdell 06/23/2014, 11:52 AM Pager [phone]

## 2014-06-23 NOTE — Progress Notes (Signed)
Occupational Therapy Evaluation Patient Details Name: Julia [name] MRN: [id number] DOB: December 28, 1931 Today's Date: 06/23/2014    History of Present Illness 80 yo female admitted with L facial and side numbness. Pt walks with bil canes NIH=7 MRI(-) PMH: CP, DMII, dizziness, HTN, partial lung removal, multiple surgeries for cerebral palsy   Clinical Impression   Pt admitted with the above. Pt currently with functional limitiations due to the deficits listed below (see OT problem list). Pt completed ADLs and short distance mobility in room at min guard assist level. Pt had no overt LOB and appears to be near her baseline of function, however she is still a high fall risk. Pt will benefit from skilled OT to increase their independence and safety with adls and balance to allow [facility].    Follow Up Recommendations  Home health OT;Supervision - Intermittent    Equipment Recommendations  3 in 1 bedside comode    Recommendations for Other Services       Precautions / Restrictions Precautions Precautions: Fall Restrictions Weight Bearing Restrictions: No      Mobility Bed Mobility               General bed mobility comments: Pt on BSC on OT arrival  Transfers Overall transfer level: Needs assistance Equipment used: Lofstrands Transfers: Sit to/from [employer]Stand;Stand Pivot Transfers Sit to Stand: Min guard Stand pivot transfers: Min guard       General transfer comment: Pt used lofstrand crutches and BSC and recliner arms to push up to standing. No overt LOB.    Balance Overall balance assessment: Needs assistance (Pt reports no recent falls) Sitting-balance support: No upper extremity supported;Feet supported Sitting balance-Leahy Scale: Fair     Standing balance support: Bilateral upper extremity supported;During functional activity Standing balance-Leahy Scale: Poor                              ADL Overall ADL's : Needs  assistance/impaired     Grooming: Wash/dry face;Oral care;Min guard;Standing               Lower Body Dressing: Min guard;Sitting/lateral leans   Toilet Transfer: Min guard;Stand-pivot;BSC   Toileting- Clothing Manipulation and Hygiene: Min guard;Sit to/from stand       Functional mobility during ADLs: Min guard (Lofstrand crutches) General ADL Comments: Min guard (A) for ADLs and short distance mobility. No overt LOB.      Vision                     Perception     Praxis      Pertinent Vitals/Pain Pain Assessment: No/denies pain     Hand Dominance Right   Extremity/Trunk Assessment Upper Extremity Assessment Upper Extremity Assessment: Overall WFL for tasks assessed   Lower Extremity Assessment Lower Extremity Assessment: Defer to PT evaluation   Cervical / Trunk Assessment Cervical / Trunk Assessment: Other exceptions Cervical / Trunk Exceptions: scoliosis   Communication Communication Communication: No difficulties   Cognition Arousal/Alertness: Awake/alert Behavior During Therapy: WFL for tasks assessed/performed Overall Cognitive Status: Within Functional Limits for tasks assessed                     General Comments       Exercises       Shoulder Instructions      Home Living Family/patient expects to be discharged to:: Private residence Living Arrangements: Alone Available  Help at Discharge: Family;Available PRN/intermittently Type of Home: House Home Access: Ramped entrance     Home Layout: One level     Bathroom Shower/Tub: Walk-in shower;Door   Foot LockerBathroom Toilet: Standard     Home Equipment: Shower seat;Grab bars - toilet;Grab bars - tub/shower;Wheelchair - power;Crutches;Hand held shower head          Prior Functioning/Environment Level of Independence: Independent with assistive device(s)        Comments: Drives    OT Diagnosis: Generalized weakness   OT Problem List: Decreased strength;Decreased  range of motion;Decreased activity tolerance;Impaired balance (sitting and/or standing);Decreased coordination   OT Treatment/Interventions: Self-care/ADL training;Therapeutic exercise;Neuromuscular education;Energy conservation;DME and/or AE instruction;Therapeutic activities;Patient/family education;Balance training    OT Goals(Current goals can be found in the care plan section) Acute Rehab OT Goals Patient Stated Goal: go home tomorrow OT Goal Formulation: With patient Time For Goal Achievement: 07/07/14 Potential to Achieve Goals: Good ADL Goals Pt Will Perform Lower Body Bathing: with modified independence;sitting/lateral leans Pt Will Perform Lower Body Dressing: with modified independence;with adaptive equipment;sit to/from stand Pt Will Transfer to Toilet: with modified independence;ambulating;regular height toilet;grab bars (lofstrand crutches) Pt Will Perform Toileting - Clothing Manipulation and hygiene: with modified independence;sit to/from stand Pt Will Perform Tub/Shower Transfer: Shower transfer;with modified independence;ambulating;shower seat;grab bars (lofstrand crutches)  OT Frequency: Min 3X/week   Barriers to D/C: Decreased caregiver support  Pt lives alone and family is only available intermittently       Co-evaluation              End of Session Equipment Utilized During Treatment: Gait belt;Other (comment) (Lofstrand crutches) Nurse Communication: Mobility status;Precautions  Activity Tolerance: Patient tolerated treatment well Patient left: in chair;Other (comment) (with PT)   Time: [id number] OT Time Calculation (min): 25 min Charges:    G-Codes:    Nils PyleBermel, Day Deery 06/23/2014, 4:54 PM

## 2014-06-23 NOTE — Evaluation (Signed)
SLP Cancellation Note  Patient Details Name: Jonelle SidleVina N Allebach MRN: [id number] DOB: [birth year]   Cancelled treatment:       Reason Eval/Treat Not Completed: Other (comment) (spoke to MD who desires order to be cancelled at this point, please reorder if desire )   Donavan Burnetamara Markeese Boyajian, MS [facility] S[facility] [phone]

## 2014-06-23 NOTE — Discharge Summary (Addendum)
[facility]  Physician Discharge Summary   Patient ID: Julia [name] MRN: [id number] DOB/AGE: 78-Jan-1933 78 y.o.  Admit date: 06/22/2014 Discharge date: 06/23/2014  PCP: Julia BladeEAN, ERIC, MD  DISCHARGE DIAGNOSES:  Principal Problem:   Facial numbness Active Problems:   TIA (transient ischemic attack)   HTN (hypertension)   DM type 2 without retinopathy   Left jaw numbness   Cystic fibrosis   RECOMMENDATIONS FOR OUTPATIENT FOLLOW UP: 1. Needs follow up with PCP for facial numbness 2. Home health arranged.  DISCHARGE CONDITION: fair  Diet recommendation: Mod Carb  Filed Weights   06/22/14 1352  Weight: 55.339 kg (122 lb)    INITIAL HISTORY: 80 year old female with history of cerebral palsy who walks around, using crutches and is otherwise wheelchair bound but fairly independent at home and presented with left-sided facial numbness. She was admitted for further workup.  Consultants: Neurology  Procedures:  Carotid Doppler. Bilateral: 1-39% ICA stenosis. Vertebral artery flow is antegrade. .  2-D echocardiogram. Study Conclusions - Left ventricle: The cavity size was normal. Wall thickness wasincreased in a pattern of mild LVH. There was mild focal basalhypertrophy of the septum. Systolic function was vigorous. Theestimated ejection fraction was in the range of 65% to 70%. Wallmotion was normal; there were no regional wall motionabnormalities. Doppler parameters are consistent with abnormalleft ventricular relaxation (grade 1 diastolic dysfunction). Impressions: - Normal LV function; mild LVH; grade 1 diastolic dysfunction.  HOSPITAL COURSE:    Left Facial numbness MRI was negative for stroke. Discussed with the stroke team and they did not think TIA is a possibility as well. Carotid Doppler and ECHo report as above. Await PT/OT evaluation. PT would like to walk her using her crutches. We await for her crutches to come from home. Continue with aspirin. If  PT eval is completed today, she can go home subsequently.   Essential HTN Continue home medications.  DM2 Continue current medications.   History of cerebral palsy She has chronic lower extremity weakness. She uses crutches to ambulate in the house. Uses wheelchair rest of the time. She however is quite independent at home.  If cleared by PT she can be discharged home.   Home health was recommended. This will be arranged.  PERTINENT LABS:  The results of significant diagnostics from this hospitalization (including imaging, microbiology, ancillary and laboratory) are listed below for reference.     Labs: Basic Metabolic Panel:  Recent Labs Lab 06/22/14 1321 06/22/14 1334 06/23/14 0715  NA 141 141 142  K 4.3 4.1 3.8  CL 101 104 105  CO2 24  --  24  GLUCOSE 178* 184* 138*  BUN 13 14 16   CREATININE 0.51 0.60 0.54  CALCIUM 9.8  --  9.3   Liver Function Tests:  Recent Labs Lab 06/22/14 1321 06/23/14 0715  AST 22 22  ALT 25 22  ALKPHOS 147* 125*  BILITOT 0.3 0.3  PROT 8.2 7.2  ALBUMIN 4.0 3.3*   CBC:  Recent Labs Lab 06/22/14 1321 06/22/14 1334 06/23/14 0715  WBC 6.7  --  6.1  NEUTROABS 4.9  --   --   HGB 15.1* 17.3* 13.8  HCT 46.7* 51.0* 42.2  MCV 78.0  --  77.6*  PLT 183  --  186   CBG:  Recent Labs Lab 06/22/14 1750 06/22/14 2147 06/23/14 0650 06/23/14 1216  GLUCAP 191* 153* 130* 125*     IMAGING STUDIES Ct Head Wo Contrast  06/22/2014   ADDENDUM REPORT: 06/22/2014 14:02  ADDENDUM: These  results were called by telephone at the time of interpretation on 06/22/2014 at 2:02 pm to Dr. Bethann Berkshire , who verbally acknowledged these results.   Electronically Signed   By: Alcide Clever M.D.   On: 06/22/2014 14:02   06/22/2014   CLINICAL DATA:  Left facial numbness since this morning  EXAM: CT HEAD WITHOUT CONTRAST  TECHNIQUE: Contiguous axial images were obtained from the base of the skull through the vertex without intravenous contrast.   COMPARISON:  05/17/2010  FINDINGS: Bony calvarium is intact. No gross soft tissue abnormality is noted. Mild atrophic changes are noted. Chronic white matter ischemic change is again seen. No findings to suggest acute hemorrhage, acute infarction or space-occupying mass lesion are noted.  IMPRESSION: Chronic atrophic and ischemic changes without acute abnormality.  Electronically Signed: By: Alcide Clever M.D. On: 06/22/2014 13:37   Mr [name] Head Wo Contrast  06/22/2014   CLINICAL DATA:  Stroke. Left facial numbness began 6 hr ago. History of cerebral palsy.  EXAM: MRI HEAD WITHOUT CONTRAST  MRA HEAD WITHOUT CONTRAST  TECHNIQUE: Multiplanar, multiecho pulse sequences of the brain and surrounding structures were obtained without intravenous contrast. Angiographic images of the head were obtained using MRA technique without contrast.  COMPARISON:  CT head 06/22/2014  FINDINGS: MRI HEAD FINDINGS  Generalized atrophy. Negative for hydrocephalus. Image quality degraded by mild motion.  Negative for acute infarct. Moderate chronic microvascular ischemic change in the white matter and pons. Chronic left parietal infarct.  Focus of micro hemorrhage in the right occipital lobe appears chronic. Negative for mass lesion. Right temporal scalp hematoma, an incidental finding.  MRA HEAD FINDINGS  Both vertebral arteries are patent to the basilar. Right PICA patent. Left PICA not visualized. Basilar widely patent. Superior cerebellar and posterior cerebral arteries are patent bilaterally.  Internal carotid artery widely patent bilaterally.  Right anterior cerebral artery widely patent. Right middle cerebral artery widely patent. Mild atherosclerotic disease right middle cerebral artery branches  Left anterior cerebral artery widely patent. Left M1 segment widely patent. Severe stenosis of the inferior branch of the left middle cerebral artery. Mild disease in the superior branch of the left middle cerebral artery  Negative for  cerebral aneurysm.  IMPRESSION: Atrophy and moderate chronic ischemic change.  No acute infarct  Intracranial atherosclerotic disease in the middle cerebral arteries left greater than right.   Electronically Signed   By: Marlan Palau M.D.   On: 06/22/2014 19:18   Mr Brain Wo Contrast  06/22/2014   CLINICAL DATA:  Stroke. Left facial numbness began 6 hr ago. History of cerebral palsy.  EXAM: MRI HEAD WITHOUT CONTRAST  MRA HEAD WITHOUT CONTRAST  TECHNIQUE: Multiplanar, multiecho pulse sequences of the brain and surrounding structures were obtained without intravenous contrast. Angiographic images of the head were obtained using MRA technique without contrast.  COMPARISON:  CT head 06/22/2014  FINDINGS: MRI HEAD FINDINGS  Generalized atrophy. Negative for hydrocephalus. Image quality degraded by mild motion.  Negative for acute infarct. Moderate chronic microvascular ischemic change in the white matter and pons. Chronic left parietal infarct.  Focus of micro hemorrhage in the right occipital lobe appears chronic. Negative for mass lesion. Right temporal scalp hematoma, an incidental finding.  MRA HEAD FINDINGS  Both vertebral arteries are patent to the basilar. Right PICA patent. Left PICA not visualized. Basilar widely patent. Superior cerebellar and posterior cerebral arteries are patent bilaterally.  Internal carotid artery widely patent bilaterally.  Right anterior cerebral artery widely patent. Right middle  cerebral artery widely patent. Mild atherosclerotic disease right middle cerebral artery branches  Left anterior cerebral artery widely patent. Left M1 segment widely patent. Severe stenosis of the inferior branch of the left middle cerebral artery. Mild disease in the superior branch of the left middle cerebral artery  Negative for cerebral aneurysm.  IMPRESSION: Atrophy and moderate chronic ischemic change.  No acute infarct  Intracranial atherosclerotic disease in the middle cerebral arteries left  greater than right.   Electronically Signed   By: Marlan Palauharles  Clark M.D.   On: 06/22/2014 19:18    DISCHARGE EXAMINATION: Filed Vitals:   06/23/14 0400 06/23/14 0600 06/23/14 1027 06/23/14 1506  BP: 138/52 145/59 152/75 140/60  Pulse: 71 67 78 75  Temp: 97.7 F (36.5 C) 97.9 F (36.6 C) 97.8 F (36.6 C) 98.1 F (36.7 C)  TempSrc: Oral Oral Oral Oral  Resp: 14 16 16 16   Height:      Weight:      SpO2: 96% 98% 97% 96%   General appearance: alert, cooperative, appears stated age and no distress Resp: clear to auscultation bilaterally Cardio: regular rate and rhythm, S1, S2 normal, no murmur, click, rub or gallop GI: soft, non-tender; bowel sounds normal; no masses,  no organomegaly  DISPOSITION: Home Discharge Instructions    Diet Carb Modified    Complete by:  As directed      Increase activity slowly    Complete by:  As directed            ALLERGIES:  Allergies  Allergen Reactions  . Penicillins Other (See Comments)    "Made my feet sore" in childhood    Current Discharge Medication List    CONTINUE these medications which have NOT CHANGED   Details  ALPHAGAN P 0.1 % SOLN Apply 1 drop to eye 2 (two) times daily.  Refills: 0    amLODipine (NORVASC) 10 MG tablet Take 10 mg by mouth daily.      aspirin 81 MG tablet Take 81 mg by mouth daily.      Cholecalciferol (VITAMIN D PO) Take 1,000 Units by mouth daily.      glipiZIDE (GLUCOTROL) 5 MG tablet Take 5 mg by mouth daily.      losartan (COZAAR) 25 MG tablet Take 25 mg by mouth daily.    metFORMIN (GLUCOPHAGE-XR) 500 MG 24 hr tablet Take 500 mg by mouth daily. Refills: 0    Multiple Vitamin (MULTIVITAMIN) tablet Take 1 tablet by mouth daily.      omeprazole (PRILOSEC) 20 MG capsule Take 20 mg by mouth 2 (two) times daily. Refills: 0    TRAVATAN Z 0.004 % SOLN ophthalmic solution Place 1 drop into both eyes at bedtime.  Refills: 0    ALPRAZolam (XANAX) 0.25 MG tablet Take 0.25 mg by mouth as needed.      azelastine (ASTELIN) 137 MCG/SPRAY nasal spray 1 spray by Nasal route. Use in each nostril as directed     meclizine (ANTIVERT) 12.5 MG tablet Take 12.5 mg by mouth 3 (three) times daily as needed.      PRESCRIPTION MEDICATION Apply 1 drop to eye 2 (two) times daily.      STOP taking these medications     latanoprost (XALATAN) 0.005 % ophthalmic solution        Follow-up Information    Follow up with August SaucerEAN, ERIC, MD. Schedule an appointment as soon as possible for a visit in 1 week.   Specialty:  Internal Medicine   Why:  post hospitalization follow up   Contact information:   [facility] [address] [place] [address] [phone]       TOTAL DISCHARGE TIME: 35 mins.  [facility]  [facility] Pager [phone]  06/23/2014, 5:14 PM

## 2014-06-23 NOTE — Progress Notes (Signed)
STROKE TEAM PROGRESS NOTE   HISTORY Julia [name] is an 80 y.o. female with history of CP, walks with bilateral canes, at baseline unable to lift bilateral legs off the bed. She went to sleep last night at 2200 hours and felt normal. Upon waking this AM at 0700 hours she noted left facial numbness but no other symptoms. Patient came to ED due to this Sx. On arrival a code stroke was called. Patient was brought to CT scanner and found to have no mass or bleed. Currently she continues to endorse left lower facial numbness and no other symptoms. tPA was not given secondary to being out of window and minimal symptoms.   Date last known well: Date: 06/21/2014 Time last known well: Time: 22:00 tPA Given: No: minimal symptoms and out of window.      SUBJECTIVE (INTERVAL HISTORY) No family is at bedside, the patient is alert and cooperative. Still complains of some residual left facial numbness, improved from onset.   OBJECTIVE Temp:  [97.7 F (36.5 C)-98.7 F (37.1 C)] 97.8 F (36.6 C) (12/16 1027) Pulse Rate:  [67-103] 78 (12/16 1027) Cardiac Rhythm:  [-]  Resp:  [14-22] 16 (12/16 1027) BP: (136-187)/(52-92) 152/75 mmHg (12/16 1027) SpO2:  [95 %-99 %] 97 % (12/16 1027) Weight:  [122 lb (55.339 kg)] 122 lb (55.339 kg) (12/15 1352)   Recent Labs Lab 06/22/14 1750 06/22/14 2147 06/23/14 0650  GLUCAP 191* 153* 130*    Recent Labs Lab 06/22/14 1321 06/22/14 1334 06/23/14 0715  NA 141 141 142  K 4.3 4.1 3.8  CL 101 104 105  CO2 24  --  24  GLUCOSE 178* 184* 138*  BUN 13 14 16   CREATININE 0.51 0.60 0.54  CALCIUM 9.8  --  9.3    Recent Labs Lab 06/22/14 1321 06/23/14 0715  AST 22 22  ALT 25 22  ALKPHOS 147* 125*  BILITOT 0.3 0.3  PROT 8.2 7.2  ALBUMIN 4.0 3.3*    Recent Labs Lab 06/22/14 1321 06/22/14 1334 06/23/14 0715  WBC 6.7  --  6.1  NEUTROABS 4.9  --   --   HGB 15.1* 17.3* 13.8  HCT 46.7* 51.0* 42.2  MCV 78.0  --  77.6*  PLT 183  --  186   No  results for input(s): CKTOTAL, CKMB, CKMBINDEX, TROPONINI in the last 168 hours.  Recent Labs  06/22/14 1321  LABPROT 13.1  INR 0.98    Recent Labs  06/22/14 1409  COLORURINE YELLOW  LABSPEC 1.006  PHURINE 7.0  GLUCOSEU NEGATIVE  HGBUR NEGATIVE  BILIRUBINUR NEGATIVE  KETONESUR NEGATIVE  PROTEINUR NEGATIVE  UROBILINOGEN 0.2  NITRITE NEGATIVE  LEUKOCYTESUR NEGATIVE       Component Value Date/Time   CHOL 111 06/23/2014 0715   TRIG 90 06/23/2014 0715   HDL 59 06/23/2014 0715   CHOLHDL 1.9 06/23/2014 0715   VLDL 18 06/23/2014 0715   LDLCALC 34 06/23/2014 0715   Lab Results  Component Value Date   HGBA1C 6.8* 12/22/2012   No results found for: LABOPIA, COCAINSCRNUR, LABBENZ, AMPHETMU, THCU, LABBARB  No results for input(s): ETH in the last 168 hours.  Ct Head Wo Contrast  06/22/2014   ADDENDUM REPORT: 06/22/2014 14:02  ADDENDUM: These results were called by telephone at the time of interpretation on 06/22/2014 at 2:02 pm to Dr. Bethann BerkshireJOSEPH ZAMMIT , who verbally acknowledged these results.   Electronically Signed   By: Alcide CleverMark  Lukens M.D.   On: 06/22/2014 14:02   06/22/2014  CLINICAL DATA:  Left facial numbness since this morning  EXAM: CT HEAD WITHOUT CONTRAST  TECHNIQUE: Contiguous axial images were obtained from the base of the skull through the vertex without intravenous contrast.  COMPARISON:  05/17/2010  FINDINGS: Bony calvarium is intact. No gross soft tissue abnormality is noted. Mild atrophic changes are noted. Chronic white matter ischemic change is again seen. No findings to suggest acute hemorrhage, acute infarction or space-occupying mass lesion are noted.  IMPRESSION: Chronic atrophic and ischemic changes without acute abnormality.  Electronically Signed: By: Alcide CleverMark  Lukens M.D. On: 06/22/2014 13:37   Mr Maxine GlennMra Head Wo Contrast  06/22/2014   CLINICAL DATA:  Stroke. Left facial numbness began 6 hr ago. History of cerebral palsy.  EXAM: MRI HEAD WITHOUT CONTRAST  MRA HEAD  WITHOUT CONTRAST  TECHNIQUE: Multiplanar, multiecho pulse sequences of the brain and surrounding structures were obtained without intravenous contrast. Angiographic images of the head were obtained using MRA technique without contrast.  COMPARISON:  CT head 06/22/2014  FINDINGS: MRI HEAD FINDINGS  Generalized atrophy. Negative for hydrocephalus. Image quality degraded by mild motion.  Negative for acute infarct. Moderate chronic microvascular ischemic change in the white matter and pons. Chronic left parietal infarct.  Focus of micro hemorrhage in the right occipital lobe appears chronic. Negative for mass lesion. Right temporal scalp hematoma, an incidental finding.  MRA HEAD FINDINGS  Both vertebral arteries are patent to the basilar. Right PICA patent. Left PICA not visualized. Basilar widely patent. Superior cerebellar and posterior cerebral arteries are patent bilaterally.  Internal carotid artery widely patent bilaterally.  Right anterior cerebral artery widely patent. Right middle cerebral artery widely patent. Mild atherosclerotic disease right middle cerebral artery branches  Left anterior cerebral artery widely patent. Left M1 segment widely patent. Severe stenosis of the inferior branch of the left middle cerebral artery. Mild disease in the superior branch of the left middle cerebral artery  Negative for cerebral aneurysm.  IMPRESSION: Atrophy and moderate chronic ischemic change.  No acute infarct  Intracranial atherosclerotic disease in the middle cerebral arteries left greater than right.   Electronically Signed   By: Marlan Palauharles  Clark M.D.   On: 06/22/2014 19:18   Mr Brain Wo Contrast  06/22/2014   CLINICAL DATA:  Stroke. Left facial numbness began 6 hr ago. History of cerebral palsy.  EXAM: MRI HEAD WITHOUT CONTRAST  MRA HEAD WITHOUT CONTRAST  TECHNIQUE: Multiplanar, multiecho pulse sequences of the brain and surrounding structures were obtained without intravenous contrast. Angiographic images of  the head were obtained using MRA technique without contrast.  COMPARISON:  CT head 06/22/2014  FINDINGS: MRI HEAD FINDINGS  Generalized atrophy. Negative for hydrocephalus. Image quality degraded by mild motion.  Negative for acute infarct. Moderate chronic microvascular ischemic change in the white matter and pons. Chronic left parietal infarct.  Focus of micro hemorrhage in the right occipital lobe appears chronic. Negative for mass lesion. Right temporal scalp hematoma, an incidental finding.  MRA HEAD FINDINGS  Both vertebral arteries are patent to the basilar. Right PICA patent. Left PICA not visualized. Basilar widely patent. Superior cerebellar and posterior cerebral arteries are patent bilaterally.  Internal carotid artery widely patent bilaterally.  Right anterior cerebral artery widely patent. Right middle cerebral artery widely patent. Mild atherosclerotic disease right middle cerebral artery branches  Left anterior cerebral artery widely patent. Left M1 segment widely patent. Severe stenosis of the inferior branch of the left middle cerebral artery. Mild disease in the superior branch of the left middle  cerebral artery  Negative for cerebral aneurysm.  IMPRESSION: Atrophy and moderate chronic ischemic change.  No acute infarct  Intracranial atherosclerotic disease in the middle cerebral arteries left greater than right.   Electronically Signed   By: Marlan Palau M.D.   On: 06/22/2014 19:18     Physical Exam  General: The patient is alert and cooperative at the time of the examination.  Skin: No significant peripheral edema is noted. Lower extremities are atrophic.   Neurologic Exam  Mental status: The patient is oriented x 3.  Cranial nerves: Facial symmetry is present. Speech is normal, no aphasia or dysarthria is noted. Extraocular movements are full. Visual fields are full.  Motor: The patient has good strength in the upper extremities. With the lower extremities, the patient has  diffuse 3/5 strength.  Sensory examination: Soft touch sensation is symmetric on the face, arms, and legs.  Coordination: The patient has good finger-nose-finger bilaterally. The patient not able to perform heel-to-shin bilaterally.  Gait and station: The patient was not ambulated.  Reflexes: Deep tendon reflexes are symmetric, but are depressed.    ASSESSMENT/PLAN Julia [name] is a 80 y.o. female with history of cerebral palsy presenting with left facial numbness. TPA was not given secondary to minimal deficit.  MRI shows no evidence of stroke. The patient was on aspirin prior to coming in the hospital.    MRI  MRA  IMPRESSION: Atrophy and moderate chronic ischemic change. No acute infarct  Intracranial atherosclerotic disease in the middle cerebral arteries left greater than right.     Carotid Doppler  Preliminary report: Bilateral: 1-39% ICA stenosis. Vertebral artery flow is antegrade.   2D Echo  pending  LDL 34  HgbA1c pending  Subcutaneous heparin for VTE prophylaxis  Diet Carb Modified thin liquids  On aspirin prior to admission  Patient counseled to be compliant with her antithrombotic medications  Ongoing aggressive stroke risk factor management  Therapy recommendations:  pending  Disposition:  Plan to return home  Hypertension  Home meds:   Include Cozaar, Norvasc for blood pressure  Stable  Patient counseled to be compliant with her blood pressure medications  Hyperlipidemia  LDL 34, goal < 70  No need for statin medication  Diabetes  HgbA1c pending goal < 7.0   Other Stroke Risk Factors  Advanced age   Body mass index is 23.83 kg/(m^2).  Other Active Problems  Sarcoidosis  Cerebral palsy  Other Pertinent History  Gait disorder  Hospital day # 1  Asian presents with subjective numbness of the left face, no other change in clinical condition, MRI the brain does not show evidence of an acute stroke. 2-D  echocardiogram results are pending. The patient likely could return home soon after the echocardiogram has been performed.   Lesly Dukes [phone] To contact Stroke Continuity provider, please refer to [email or web address]. After hours, contact General Neurology

## 2014-06-23 NOTE — Discharge Instructions (Signed)

## 2014-06-24 DIAGNOSIS — G459 Transient cerebral ischemic attack, unspecified: Secondary | ICD-10-CM | POA: Diagnosis not present

## 2014-06-24 LAB — GLUCOSE, CAPILLARY: Glucose-Capillary: 143 mg/dL — ABNORMAL HIGH (ref 70–99)

## 2014-06-24 NOTE — Progress Notes (Signed)
D/C orders received, pt [facility].  IV and telemetry D/C.  Rx and D/C instructions given with verbalized understanding. Staff brought pt downstairs via wheelchair.

## 2014-06-24 NOTE — Progress Notes (Signed)
Physical Therapy Treatment Patient Details Name: Julia [name] MRN: [id number] DOB: 1932/06/09 Today's Date: 06/24/2014    History of Present Illness 80 yo female admitted with L facial and side numbness. Pt walks with bil canes NIH=7 MRI(-) PMH: CP, DMII, dizziness, HTN, partial lung removal, multiple surgeries for cerebral palsy    PT Comments    Pt motivated to maintain her independence and appears to have a good support system. She is interested in HHPT for safety evaluation and to update her exercise/stretching program (she feels she is becoming less flexible--it did take her >10 minutes to don her own shoes with need for repeated lifting/bending leg as it slips off her other leg). Ready for discharge from PT standpoint.   Follow Up Recommendations  Home health PT;Supervision - Intermittent (safety evaluation; supervision for community needs as PTA)     Equipment Recommendations  None recommended by PT    Recommendations for Other Services       Precautions / Restrictions Precautions Precautions: Fall Restrictions Weight Bearing Restrictions: No    Mobility  Bed Mobility               General bed mobility comments: pt up in chair and returned to chair  Transfers Overall transfer level: Needs assistance Equipment used: Lofstrands Transfers: Sit to/from Stand Sit to Stand: Min assist         General transfer comment: pt has carpet at home and crutch tips do not slide on her carpet (sliding on the tile floor and required assist to stabilize her crutch)  Ambulation/Gait Ambulation/Gait assistance: Min guard;Modified independent (Device/Increase time) Ambulation Distance (Feet): 50 Feet Assistive device: Lofstrands Gait Pattern/deviations: Step-to pattern;Decreased stride length;Shuffle;Trunk flexed;Narrow base of support Gait velocity: decreased   General Gait Details: no assist needed with crutches   Stairs            Wheelchair Mobility     Modified Rankin (Stroke Patients Only) Modified Rankin (Stroke Patients Only) Pre-Morbid Rankin Score: Slight disability Modified Rankin: Slight disability     Balance                                    Cognition Arousal/Alertness: Awake/alert Behavior During Therapy: WFL for tasks assessed/performed Overall Cognitive Status: Within Functional Limits for tasks assessed                      Exercises      General Comments        Pertinent Vitals/Pain Pain Assessment: No/denies pain    Home Living                      Prior Function            PT Goals (current goals can now be found in the care plan section) Acute Rehab PT Goals Patient Stated Goal: go home today Progress towards PT goals: Goals met/education completed, patient discharged from PT    Frequency  Min 3X/week    PT Plan Current plan remains appropriate    Co-evaluation             End of Session   Activity Tolerance: Patient tolerated treatment well Patient left: in chair;with call bell/phone within reach     Time: 0812-0844 PT Time Calculation (min) (ACUTE ONLY): 32 min  Charges:  $Gait Training: 8-22 mins $Therapeutic Activity: 8-22 mins  G Codes:  Functional Assessment Tool Used: clinical observation Functional Limitation: Mobility: Walking and moving around Mobility: Walking and Moving Around Goal Status [phone]): At least 1 percent but less than 20 percent impaired, limited or restricted Mobility: Walking and Moving Around Discharge Status [phone]): At least 1 percent but less than 20 percent impaired, limited or restricted   Densel Kronick 06/24/2014, 8:54 AM Pager [phone]

## 2014-06-24 NOTE — Progress Notes (Signed)
CARE MANAGEMENT NOTE 06/24/2014  Patient:  Julia [name],Julia [name]   Account Number:  [id number]  Date Initiated:  06/24/2014  Documentation initiated by:  [facility],Alayiah Fontes  Subjective/Objective Assessment:   TIA     Action/Plan:   Anticipated DC Date:  06/24/2014   Anticipated DC Plan:  HOME W HOME HEALTH SERVICES      DC Planning Services  CM consult      [facility]   Choice offered to / List presented to:  C-1 Patient        HH arranged  HH-2 PT  HH-3 OT      [facility]  [facility]   Status of service:  Completed, signed off Medicare Important Message given?  NA - LOS <3 / Initial given by admissions (If response is "NO", the following Medicare IM given date fields will be blank) Date Medicare IM given:   Medicare IM given by:   Date Additional Medicare IM given:   Additional Medicare IM given by:    Discharge Disposition:  HOME/SELF CARE  Per UR Regulation:    If discussed at Long Length of Stay Meetings, dates discussed:    Comments:  06/24/2014 1100 NCM spoke to pt and [facility]. No DME needed for home. Pt states she lives alone and does well at home. Isidoro DonningAlesia Madissen Wyse RN CCM Case Mgmt phone [phone]

## 2014-06-24 NOTE — Progress Notes (Signed)
[facility] PROGRESS NOTE  Julia [name] ZOX:[id number] DOB: 12-Apr-1932 DOA: 06/22/2014  PCP: Willey BladeEAN, ERIC, MD  Brief HPI: 80 year old female with history of cerebral palsy who walks around, using crutches and is otherwise wheelchair bound but fairly independent at home and presented with left-sided facial numbness. She was admitted for further workup.  Past medical history:  Past Medical History  Diagnosis Date  . Essential hypertension, malignant   . Type II or unspecified type diabetes mellitus without mention of complication, not stated as uncontrolled   . Dizziness and giddiness   . Unspecified vitamin D deficiency   . Sarcoidosis   . Cerebral palsy   . Hypokalemia     Consultants: Neurology  Procedures:  Carotid Doppler. Bilateral: 1-39% ICA stenosis. Vertebral artery flow is antegrade. .  2-D echocardiogram. Study Conclusions - Left ventricle: The cavity size was normal. Wall thickness wasincreased in a pattern of mild LVH. There was mild focal basalhypertrophy of the septum. Systolic function was vigorous. Theestimated ejection fraction was in the range of 65% to 70%. Wallmotion was normal; there were no regional wall motionabnormalities. Doppler parameters are consistent with abnormalleft ventricular relaxation (grade 1 diastolic dysfunction). Impressions: - Normal LV function; mild LVH; grade 1 diastolic dysfunction.  Antibiotics: None  Subjective: Patient states that her symptoms have resolved.   Objective: Vital Signs  Filed Vitals:   06/23/14 1757 06/23/14 2154 06/24/14 0233 06/24/14 0642  BP: 151/79 148/87 130/62 138/62  Pulse: 87 77 68 63  Temp: 98.4 F (36.9 C) 98.2 F (36.8 C) 97.6 F (36.4 C) 97.9 F (36.6 C)  TempSrc: Oral Oral Oral Oral  Resp: 18 18 18 18   Height:      Weight:      SpO2: 99% 97% 96% 97%    Intake/Output Summary (Last 24 hours) at 06/24/14 [id number] Last data filed at 06/23/14 2135  Gross per 24 hour  Intake       3 ml  Output      0 ml  Net      3 ml   Filed Weights   06/22/14 1352  Weight: 55.339 kg (122 lb)    General appearance: alert, cooperative, appears stated age and no distress Resp: clear to auscultation bilaterally Cardio: regular rate and rhythm, S1, S2 normal, no murmur, click, rub or gallop GI: soft, non-tender; bowel sounds normal; no masses,  no organomegaly Neurologic: No facial asymmetry. Motor strength equal bilateral upper extremity. Not able to mobilize legs due to history of cerebral palsy.  Lab Results:  Basic Metabolic Panel:  Recent Labs Lab 06/22/14 1321 06/22/14 1334 06/23/14 0715  NA 141 141 142  K 4.3 4.1 3.8  CL 101 104 105  CO2 24  --  24  GLUCOSE 178* 184* 138*  BUN 13 14 16   CREATININE 0.51 0.60 0.54  CALCIUM 9.8  --  9.3   Liver Function Tests:  Recent Labs Lab 06/22/14 1321 06/23/14 0715  AST 22 22  ALT 25 22  ALKPHOS 147* 125*  BILITOT 0.3 0.3  PROT 8.2 7.2  ALBUMIN 4.0 3.3*   CBC:  Recent Labs Lab 06/22/14 1321 06/22/14 1334 06/23/14 0715  WBC 6.7  --  6.1  NEUTROABS 4.9  --   --   HGB 15.1* 17.3* 13.8  HCT 46.7* 51.0* 42.2  MCV 78.0  --  77.6*  PLT 183  --  186   CBG:  Recent Labs Lab 06/23/14 0650 06/23/14 1216 06/23/14 1629 06/23/14 2158 06/24/14 [id number]  GLUCAP 130* 125* 121* 143* 143*    Studies/Results: Ct Head Wo Contrast  06/22/2014   ADDENDUM REPORT: 06/22/2014 14:02  ADDENDUM: These results were called by telephone at the time of interpretation on 06/22/2014 at 2:02 pm to Dr. Bethann Berkshire , who verbally acknowledged these results.   Electronically Signed   By: Alcide Clever M.D.   On: 06/22/2014 14:02   06/22/2014   CLINICAL DATA:  Left facial numbness since this morning  EXAM: CT HEAD WITHOUT CONTRAST  TECHNIQUE: Contiguous axial images were obtained from the base of the skull through the vertex without intravenous contrast.  COMPARISON:  05/17/2010  FINDINGS: Bony calvarium is intact. No gross soft  tissue abnormality is noted. Mild atrophic changes are noted. Chronic white matter ischemic change is again seen. No findings to suggest acute hemorrhage, acute infarction or space-occupying mass lesion are noted.  IMPRESSION: Chronic atrophic and ischemic changes without acute abnormality.  Electronically Signed: By: Alcide Clever M.D. On: 06/22/2014 13:37   Mr [name] Head Wo Contrast  06/22/2014   CLINICAL DATA:  Stroke. Left facial numbness began 6 hr ago. History of cerebral palsy.  EXAM: MRI HEAD WITHOUT CONTRAST  MRA HEAD WITHOUT CONTRAST  TECHNIQUE: Multiplanar, multiecho pulse sequences of the brain and surrounding structures were obtained without intravenous contrast. Angiographic images of the head were obtained using MRA technique without contrast.  COMPARISON:  CT head 06/22/2014  FINDINGS: MRI HEAD FINDINGS  Generalized atrophy. Negative for hydrocephalus. Image quality degraded by mild motion.  Negative for acute infarct. Moderate chronic microvascular ischemic change in the white matter and pons. Chronic left parietal infarct.  Focus of micro hemorrhage in the right occipital lobe appears chronic. Negative for mass lesion. Right temporal scalp hematoma, an incidental finding.  MRA HEAD FINDINGS  Both vertebral arteries are patent to the basilar. Right PICA patent. Left PICA not visualized. Basilar widely patent. Superior cerebellar and posterior cerebral arteries are patent bilaterally.  Internal carotid artery widely patent bilaterally.  Right anterior cerebral artery widely patent. Right middle cerebral artery widely patent. Mild atherosclerotic disease right middle cerebral artery branches  Left anterior cerebral artery widely patent. Left M1 segment widely patent. Severe stenosis of the inferior branch of the left middle cerebral artery. Mild disease in the superior branch of the left middle cerebral artery  Negative for cerebral aneurysm.  IMPRESSION: Atrophy and moderate chronic ischemic change.   No acute infarct  Intracranial atherosclerotic disease in the middle cerebral arteries left greater than right.   Electronically Signed   By: Marlan Palau M.D.   On: 06/22/2014 19:18   Mr Brain Wo Contrast  06/22/2014   CLINICAL DATA:  Stroke. Left facial numbness began 6 hr ago. History of cerebral palsy.  EXAM: MRI HEAD WITHOUT CONTRAST  MRA HEAD WITHOUT CONTRAST  TECHNIQUE: Multiplanar, multiecho pulse sequences of the brain and surrounding structures were obtained without intravenous contrast. Angiographic images of the head were obtained using MRA technique without contrast.  COMPARISON:  CT head 06/22/2014  FINDINGS: MRI HEAD FINDINGS  Generalized atrophy. Negative for hydrocephalus. Image quality degraded by mild motion.  Negative for acute infarct. Moderate chronic microvascular ischemic change in the white matter and pons. Chronic left parietal infarct.  Focus of micro hemorrhage in the right occipital lobe appears chronic. Negative for mass lesion. Right temporal scalp hematoma, an incidental finding.  MRA HEAD FINDINGS  Both vertebral arteries are patent to the basilar. Right PICA patent. Left PICA not visualized. Basilar widely patent.  Superior cerebellar and posterior cerebral arteries are patent bilaterally.  Internal carotid artery widely patent bilaterally.  Right anterior cerebral artery widely patent. Right middle cerebral artery widely patent. Mild atherosclerotic disease right middle cerebral artery branches  Left anterior cerebral artery widely patent. Left M1 segment widely patent. Severe stenosis of the inferior branch of the left middle cerebral artery. Mild disease in the superior branch of the left middle cerebral artery  Negative for cerebral aneurysm.  IMPRESSION: Atrophy and moderate chronic ischemic change.  No acute infarct  Intracranial atherosclerotic disease in the middle cerebral arteries left greater than right.   Electronically Signed   By: Marlan Palauharles  Clark M.D.   On:  06/22/2014 19:18    Medications:  Scheduled: . aspirin EC  325 mg Oral Daily  . heparin  5,000 Units Subcutaneous 3 times per day  . insulin aspart  0-15 Units Subcutaneous TID WC  . insulin aspart  0-5 Units Subcutaneous QHS  . sodium chloride  3 mL Intravenous Q12H   Continuous:  WUJ:[id number]:acetaminophen **OR** acetaminophen, hydrALAZINE, ondansetron **OR** ondansetron (ZOFRAN) IV  Assessment/Plan:  Principal Problem:   Facial numbness Active Problems:   TIA (transient ischemic attack)   HTN (hypertension)   DM type 2 without retinopathy   Left jaw numbness   Cystic fibrosis    Left Facial numbness MRI negative for stroke. Discussed with stroke team and they did not think TIA is a possibility as well. Etiology remains unclear. All work up unremarkable thus far. Seen by PT and OT and home health to be arranged. Continue with aspirin.   Essential HTN Continue home medications.  DM2 Continue current medications.   History of cerebral palsy She has chronic lower extremity weakness. She uses crutches to ambulate in the house. Uses wheelchair rest of the time. She however is quite independent at home.  DVT Prophylaxis: Heparin    Code Status: Full code  Family Communication: Discussed with the patient  Disposition Plan: Ok for discharge today. See DC summary from 12/16.    LOS: 2 days   [facility] Veterans [facility] Care SystemKRISHNAN,Gleen Ripberger  [facility] Pager [phone] 06/24/2014, 9:24 AM  If 8PM-8AM, please contact night-coverage at [email or web address], password [email or web address]

## 2015-08-30 ENCOUNTER — Ambulatory Visit (HOSPITAL_COMMUNITY)
Admission: RE | Admit: 2015-08-30 | Discharge: 2015-08-30 | Disposition: A | Discharge status: Home / Self Care | Payer: Medicare Other | Source: Ambulatory Visit | Attending: Cardiovascular Disease | Admitting: Cardiovascular Disease

## 2015-08-30 ENCOUNTER — Other Ambulatory Visit (HOSPITAL_COMMUNITY): Payer: Self-pay | Admitting: Internal Medicine

## 2015-08-30 DIAGNOSIS — M7989 Other specified soft tissue disorders: Secondary | ICD-10-CM | POA: Diagnosis present

## 2015-08-30 DIAGNOSIS — I10 Essential (primary) hypertension: Secondary | ICD-10-CM | POA: Diagnosis not present

## 2015-08-30 DIAGNOSIS — Z8673 Personal history of transient ischemic attack (TIA), and cerebral infarction without residual deficits: Secondary | ICD-10-CM | POA: Insufficient documentation

## 2015-08-30 DIAGNOSIS — E119 Type 2 diabetes mellitus without complications: Secondary | ICD-10-CM | POA: Diagnosis not present

## 2015-11-16 ENCOUNTER — Encounter (INDEPENDENT_AMBULATORY_CARE_PROVIDER_SITE_OTHER): Payer: Medicare Other | Admitting: Ophthalmology

## 2015-11-16 DIAGNOSIS — H35033 Hypertensive retinopathy, bilateral: Secondary | ICD-10-CM

## 2015-11-16 DIAGNOSIS — I10 Essential (primary) hypertension: Secondary | ICD-10-CM

## 2015-11-16 DIAGNOSIS — H59022 Cataract (lens) fragments in eye following cataract surgery, left eye: Secondary | ICD-10-CM

## 2015-11-16 DIAGNOSIS — H2702 Aphakia, left eye: Secondary | ICD-10-CM | POA: Diagnosis not present

## 2015-11-16 DIAGNOSIS — H43813 Vitreous degeneration, bilateral: Secondary | ICD-10-CM | POA: Diagnosis not present

## 2015-11-17 NOTE — H&P (Signed)
Jonelle SidleVina N Battershell is an 82 y.o. female.   Chief Complaint:Floaters and poor vision after cataract surgery left eye HPI: Cataract surgery OS, 11-07-15.  Retained lens material and aphakia  Past Medical History  Diagnosis Date  . Essential hypertension, malignant   . Type II or unspecified type diabetes mellitus without mention of complication, not stated as uncontrolled   . Dizziness and giddiness   . Unspecified vitamin D deficiency   . Sarcoidosis   . Cerebral palsy   . Hypokalemia     Past Surgical History  Procedure Laterality Date  . Cerebral palsy surgeries    . Cesarean section    . Abdominal hysterectomy    . Lung removal, partial      Family History  Problem Relation Age of Onset  . Hypertension Mother   . Stroke Mother   . Cancer Father    Social History:  reports that she has never smoked. She has never used smokeless tobacco. She reports that she does not drink alcohol or use illicit drugs.  Allergies:  Allergies  Allergen Reactions  . Penicillins Other (See Comments)    "Made my feet sore" in childhood    No prescriptions prior to admission    Review of systems otherwise negative  There were no vitals taken for this visit.  Physical exam: Mental status: oriented x3. Eyes: See eye exam associated with this date of surgery in media tab.  Scanned in by scanning center Ears, Nose, Throat: within normal limits Neck: Within Normal limits General: within normal limits Chest: Within normal limits Breast: deferred Heart: Within normal limits Abdomen: Within normal limits GU: deferred Extremities: within normal limits Skin: within normal limits  Assessment/Plan Retained lens material and aphakia [facility], laser, removal of retained lens material, placement of secondary intraocular lens with suture left eye  Jasson Siegmann, Beulah GandyJOHN D 11/17/2015, 7:31 AM

## 2015-11-28 ENCOUNTER — Encounter (HOSPITAL_COMMUNITY): Payer: Self-pay | Admitting: *Deleted

## 2015-11-28 MED ORDER — CEFAZOLIN SODIUM-DEXTROSE 2-4 GM/100ML-% IV SOLN
2.0000 g | INTRAVENOUS | Status: DC
  Filled 2015-11-28: qty 100

## 2015-11-28 NOTE — Progress Notes (Signed)
Pt denies SOB, chest pain, and being under the care of a cardiologist. Pt denies having a stress test and cardiac cath. Pt denies having a chest x ray and EKG within the last year. Pt made aware to stop vitamins, fish oil and herbal medications. Pt verbalized understanding of all pre--op instructions. Pt advised by MD of arrival time and medications to take DOS. Anesthesia asked to review pt chart due to history of CP.

## 2015-11-29 ENCOUNTER — Observation Stay (HOSPITAL_COMMUNITY)
Admission: RE | Admit: 2015-11-29 | Discharge: 2015-11-30 | Disposition: A | Discharge status: Home / Self Care | Payer: Medicare Other | Source: Ambulatory Visit | Attending: Ophthalmology | Admitting: Ophthalmology

## 2015-11-29 ENCOUNTER — Encounter (HOSPITAL_COMMUNITY): Payer: Self-pay | Admitting: Surgery

## 2015-11-29 ENCOUNTER — Ambulatory Visit (HOSPITAL_COMMUNITY): Payer: Medicare Other | Admitting: Emergency Medicine

## 2015-11-29 ENCOUNTER — Encounter (HOSPITAL_COMMUNITY)
Admission: RE | Disposition: A | Discharge status: Home / Self Care | Payer: Self-pay | Source: Ambulatory Visit | Attending: Ophthalmology

## 2015-11-29 ENCOUNTER — Other Ambulatory Visit: Payer: Self-pay

## 2015-11-29 DIAGNOSIS — Z7984 Long term (current) use of oral hypoglycemic drugs: Secondary | ICD-10-CM | POA: Insufficient documentation

## 2015-11-29 DIAGNOSIS — H43392 Other vitreous opacities, left eye: Secondary | ICD-10-CM | POA: Diagnosis not present

## 2015-11-29 DIAGNOSIS — E119 Type 2 diabetes mellitus without complications: Secondary | ICD-10-CM | POA: Insufficient documentation

## 2015-11-29 DIAGNOSIS — H2702 Aphakia, left eye: Principal | ICD-10-CM | POA: Insufficient documentation

## 2015-11-29 DIAGNOSIS — H59022 Cataract (lens) fragments in eye following cataract surgery, left eye: Secondary | ICD-10-CM | POA: Diagnosis present

## 2015-11-29 DIAGNOSIS — H1820 Unspecified corneal edema: Secondary | ICD-10-CM | POA: Diagnosis not present

## 2015-11-29 HISTORY — PX: PARS PLANA VITRECTOMY: SHX2166

## 2015-11-29 HISTORY — PX: LASER PHOTO ABLATION: SHX5942

## 2015-11-29 HISTORY — PX: GAS/FLUID EXCHANGE: SHX5334

## 2015-11-29 HISTORY — DX: Gastro-esophageal reflux disease without esophagitis: K21.9

## 2015-11-29 HISTORY — DX: Calculus of kidney: N20.0

## 2015-11-29 LAB — GLUCOSE, CAPILLARY
GLUCOSE-CAPILLARY: 172 mg/dL — AB (ref 65–99)
GLUCOSE-CAPILLARY: 180 mg/dL — AB (ref 65–99)
Glucose-Capillary: 203 mg/dL — ABNORMAL HIGH (ref 65–99)

## 2015-11-29 LAB — BASIC METABOLIC PANEL
Anion gap: 13 (ref 5–15)
BUN: 12 mg/dL (ref 6–20)
CALCIUM: 9.9 mg/dL (ref 8.9–10.3)
CO2: 22 mmol/L (ref 22–32)
Chloride: 105 mmol/L (ref 101–111)
Creatinine, Ser: 0.57 mg/dL (ref 0.44–1.00)
GFR calc non Af Amer: >60 mL/min (ref >60)
Glucose, Bld: 186 mg/dL — ABNORMAL HIGH (ref 65–99)
Potassium: 3.6 mmol/L (ref 3.5–5.1)
Sodium: 140 mmol/L (ref 135–145)

## 2015-11-29 LAB — CBC
HEMATOCRIT: 46.8 % — AB (ref 36.0–46.0)
Hemoglobin: 15.4 g/dL — ABNORMAL HIGH (ref 12.0–15.0)
MCH: 25.1 pg — ABNORMAL LOW (ref 26.0–34.0)
MCHC: 32.9 g/dL (ref 30.0–36.0)
MCV: 76.2 fL — ABNORMAL LOW (ref 78.0–100.0)
PLATELETS: 221 10*3/uL (ref 150–400)
RBC: 6.14 MIL/uL — ABNORMAL HIGH (ref 3.87–5.11)
RDW: 15 % (ref 11.5–15.5)
WBC: 9.7 10*3/uL (ref 4.0–10.5)

## 2015-11-29 SURGERY — PARS PLANA VITRECTOMY WITH 25G REMOVAL/SUTURE INTRAOCULAR LENS
Anesthesia: General | Site: Eye | Laterality: Left

## 2015-11-29 MED ORDER — GATIFLOXACIN 0.5 % OP SOLN
OPHTHALMIC | Status: AC
  Administered 2015-11-29: 10:00:00
  Filled 2015-11-29: qty 2.5

## 2015-11-29 MED ORDER — PHENYLEPHRINE HCL 10 % OP SOLN
OPHTHALMIC | Status: AC
  Administered 2015-11-29: 1 [drp]
  Filled 2015-11-29: qty 5

## 2015-11-29 MED ORDER — AMLODIPINE BESYLATE 10 MG PO TABS
10.0000 mg | ORAL_TABLET | Freq: Every day | ORAL | Status: DC
  Filled 2015-11-29: qty 1

## 2015-11-29 MED ORDER — ROCURONIUM BROMIDE 100 MG/10ML IV SOLN
INTRAVENOUS | Status: DC | PRN
  Administered 2015-11-29: 40 mg via INTRAVENOUS

## 2015-11-29 MED ORDER — PANTOPRAZOLE SODIUM 40 MG PO TBEC
40.0000 mg | DELAYED_RELEASE_TABLET | Freq: Every day | ORAL | Status: DC
  Administered 2015-11-29: 40 mg via ORAL
  Filled 2015-11-29 (×2): qty 1

## 2015-11-29 MED ORDER — HYPROMELLOSE (GONIOSCOPIC) 2.5 % OP SOLN
OPHTHALMIC | Status: AC
  Filled 2015-11-29: qty 15

## 2015-11-29 MED ORDER — DEXAMETHASONE SODIUM PHOSPHATE 10 MG/ML IJ SOLN
INTRAMUSCULAR | Status: AC
  Filled 2015-11-29: qty 1

## 2015-11-29 MED ORDER — METFORMIN HCL ER 500 MG PO TB24
500.0000 mg | ORAL_TABLET | Freq: Every day | ORAL | Status: DC
  Administered 2015-11-29: 500 mg via ORAL
  Filled 2015-11-29 (×2): qty 1

## 2015-11-29 MED ORDER — ONDANSETRON HCL 4 MG/2ML IJ SOLN
INTRAMUSCULAR | Status: DC | PRN
  Administered 2015-11-29: 4 mg via INTRAVENOUS

## 2015-11-29 MED ORDER — ONDANSETRON HCL 4 MG/2ML IJ SOLN
4.0000 mg | Freq: Four times a day (QID) | INTRAMUSCULAR | Status: DC
  Administered 2015-11-29: 4 mg via INTRAVENOUS
  Filled 2015-11-29: qty 2

## 2015-11-29 MED ORDER — BUPIVACAINE HCL (PF) 0.75 % IJ SOLN
INTRAMUSCULAR | Status: AC
  Filled 2015-11-29: qty 10

## 2015-11-29 MED ORDER — MAGNESIUM HYDROXIDE 400 MG/5ML PO SUSP: 15.0000 mL | Freq: Four times a day (QID) | ORAL | Status: DC | PRN

## 2015-11-29 MED ORDER — LATANOPROST 0.005 % OP SOLN
1.0000 [drp] | Freq: Every day | OPHTHALMIC | Status: DC
  Filled 2015-11-29: qty 2.5

## 2015-11-29 MED ORDER — LIDOCAINE HCL (CARDIAC) 20 MG/ML IV SOLN
INTRAVENOUS | Status: DC | PRN
  Administered 2015-11-29: 100 mg via INTRAVENOUS

## 2015-11-29 MED ORDER — ONDANSETRON HCL 4 MG/2ML IJ SOLN
INTRAMUSCULAR | Status: AC
  Filled 2015-11-29: qty 2

## 2015-11-29 MED ORDER — ARTIFICIAL TEARS OP OINT
TOPICAL_OINTMENT | OPHTHALMIC | Status: AC
  Filled 2015-11-29: qty 3.5

## 2015-11-29 MED ORDER — TRIAMCINOLONE ACETONIDE 40 MG/ML IJ SUSP
INTRAMUSCULAR | Status: AC
  Filled 2015-11-29: qty 5

## 2015-11-29 MED ORDER — POLYMYXIN B SULFATE 500000 UNITS IJ SOLR
INTRAMUSCULAR | Status: AC
  Filled 2015-11-29: qty 1

## 2015-11-29 MED ORDER — PHENYLEPHRINE HCL 2.5 % OP SOLN
OPHTHALMIC | Status: AC
  Administered 2015-11-29: 1 [drp]
  Filled 2015-11-29: qty 2

## 2015-11-29 MED ORDER — ROCURONIUM BROMIDE 50 MG/5ML IV SOLN
INTRAVENOUS | Status: AC
  Filled 2015-11-29: qty 1

## 2015-11-29 MED ORDER — CYCLOPENTOLATE HCL 1 % OP SOLN
OPHTHALMIC | Status: AC
  Administered 2015-11-29: 10:00:00
  Filled 2015-11-29: qty 2

## 2015-11-29 MED ORDER — PROPOFOL 10 MG/ML IV BOLUS
INTRAVENOUS | Status: DC | PRN
  Administered 2015-11-29: 100 mg via INTRAVENOUS

## 2015-11-29 MED ORDER — ASPIRIN EC 81 MG PO TBEC
81.0000 mg | DELAYED_RELEASE_TABLET | Freq: Every day | ORAL | Status: DC
  Administered 2015-11-29: 81 mg via ORAL
  Filled 2015-11-29 (×2): qty 1

## 2015-11-29 MED ORDER — 0.9 % SODIUM CHLORIDE (POUR BTL) OPTIME
TOPICAL | Status: DC | PRN
  Administered 2015-11-29: 200 mL

## 2015-11-29 MED ORDER — LOSARTAN POTASSIUM 50 MG PO TABS: 25.0000 mg | ORAL_TABLET | Freq: Every day | ORAL | Status: DC

## 2015-11-29 MED ORDER — TETRACAINE HCL 0.5 % OP SOLN
2.0000 [drp] | Freq: Once | OPHTHALMIC | Status: DC
  Filled 2015-11-29: qty 2

## 2015-11-29 MED ORDER — SODIUM CHLORIDE 0.9 % IJ SOLN
INTRAMUSCULAR | Status: AC
  Filled 2015-11-29: qty 10

## 2015-11-29 MED ORDER — BRIMONIDINE TARTRATE 0.2 % OP SOLN
1.0000 [drp] | Freq: Two times a day (BID) | OPHTHALMIC | Status: DC
  Administered 2015-11-29: 1 [drp] via OPHTHALMIC
  Filled 2015-11-29: qty 5

## 2015-11-29 MED ORDER — EPHEDRINE SULFATE 50 MG/ML IJ SOLN
INTRAMUSCULAR | Status: DC | PRN
  Administered 2015-11-29: 5 mg via INTRAVENOUS

## 2015-11-29 MED ORDER — TROPICAMIDE 1 % OP SOLN
1.0000 [drp] | OPHTHALMIC | Status: AC | PRN
  Administered 2015-11-29 (×2): 1 [drp] via OPHTHALMIC

## 2015-11-29 MED ORDER — MORPHINE SULFATE (PF) 2 MG/ML IV SOLN: 1.0000 mg | INTRAVENOUS | Status: DC | PRN

## 2015-11-29 MED ORDER — ATROPINE SULFATE 1 % OP SOLN
OPHTHALMIC | Status: AC
  Filled 2015-11-29: qty 5

## 2015-11-29 MED ORDER — LACTATED RINGERS IV SOLN
INTRAVENOUS | Status: DC | PRN
  Administered 2015-11-29: 13:00:00 via INTRAVENOUS

## 2015-11-29 MED ORDER — TEMAZEPAM 15 MG PO CAPS: 15.0000 mg | ORAL_CAPSULE | Freq: Every evening | ORAL | Status: DC | PRN

## 2015-11-29 MED ORDER — LOSARTAN POTASSIUM 50 MG PO TABS
100.0000 mg | ORAL_TABLET | Freq: Every day | ORAL | Status: DC
  Administered 2015-11-29: 100 mg via ORAL
  Filled 2015-11-29 (×2): qty 2

## 2015-11-29 MED ORDER — BRIMONIDINE TARTRATE 0.2 % OP SOLN
1.0000 [drp] | Freq: Two times a day (BID) | OPHTHALMIC | Status: DC
  Filled 2015-11-29: qty 5

## 2015-11-29 MED ORDER — PHENYLEPHRINE HCL 2.5 % OP SOLN
1.0000 [drp] | OPHTHALMIC | Status: AC | PRN
  Administered 2015-11-29 (×2): 1 [drp] via OPHTHALMIC

## 2015-11-29 MED ORDER — CEFTAZIDIME 1 G IJ SOLR
INTRAMUSCULAR | Status: AC
  Filled 2015-11-29: qty 1

## 2015-11-29 MED ORDER — FENTANYL CITRATE (PF) 100 MCG/2ML IJ SOLN: 25.0000 ug | INTRAMUSCULAR | Status: DC | PRN

## 2015-11-29 MED ORDER — VITAMIN D 1000 UNITS PO TABS
1000.0000 [IU] | ORAL_TABLET | Freq: Every day | ORAL | Status: DC
  Administered 2015-11-29: 1000 [IU] via ORAL
  Filled 2015-11-29 (×2): qty 1

## 2015-11-29 MED ORDER — SUGAMMADEX SODIUM 200 MG/2ML IV SOLN
INTRAVENOUS | Status: AC
  Filled 2015-11-29: qty 2

## 2015-11-29 MED ORDER — PHENYLEPHRINE HCL 10 % OP SOLN: 1.0000 [drp] | Freq: Once | OPHTHALMIC | Status: DC

## 2015-11-29 MED ORDER — TROPICAMIDE 1 % OP SOLN
OPHTHALMIC | Status: AC
  Administered 2015-11-29: 10:00:00
  Filled 2015-11-29: qty 3

## 2015-11-29 MED ORDER — STERILE WATER FOR INJECTION IJ SOLN
INTRAMUSCULAR | Status: AC
  Filled 2015-11-29: qty 20

## 2015-11-29 MED ORDER — SODIUM CHLORIDE 0.9 % IV SOLN
INTRAVENOUS | Status: DC
  Administered 2015-11-29: 10:00:00 via INTRAVENOUS

## 2015-11-29 MED ORDER — GATIFLOXACIN 0.5 % OP SOLN
1.0000 [drp] | OPHTHALMIC | Status: AC | PRN
  Administered 2015-11-29 (×2): 1 [drp] via OPHTHALMIC

## 2015-11-29 MED ORDER — SODIUM HYALURONATE 10 MG/ML IO SOLN
INTRAOCULAR | Status: AC
  Filled 2015-11-29: qty 0.85

## 2015-11-29 MED ORDER — SUGAMMADEX SODIUM 200 MG/2ML IV SOLN
INTRAVENOUS | Status: DC | PRN
  Administered 2015-11-29: 100 mg via INTRAVENOUS

## 2015-11-29 MED ORDER — PROPOFOL 10 MG/ML IV BOLUS
INTRAVENOUS | Status: AC
  Filled 2015-11-29: qty 40

## 2015-11-29 MED ORDER — ACETAZOLAMIDE SODIUM 500 MG IJ SOLR
500.0000 mg | Freq: Once | INTRAMUSCULAR | Status: AC
  Administered 2015-11-30: 500 mg via INTRAVENOUS
  Filled 2015-11-29: qty 500

## 2015-11-29 MED ORDER — FENTANYL CITRATE (PF) 250 MCG/5ML IJ SOLN
INTRAMUSCULAR | Status: AC
  Filled 2015-11-29: qty 5

## 2015-11-29 MED ORDER — BACITRACIN-POLYMYXIN B 500-10000 UNIT/GM OP OINT
1.0000 "application " | TOPICAL_OINTMENT | Freq: Three times a day (TID) | OPHTHALMIC | Status: DC
  Filled 2015-11-29 (×2): qty 3.5

## 2015-11-29 MED ORDER — GATIFLOXACIN 0.5 % OP SOLN
1.0000 [drp] | Freq: Four times a day (QID) | OPHTHALMIC | Status: DC
  Filled 2015-11-29: qty 2.5

## 2015-11-29 MED ORDER — BSS PLUS IO SOLN
INTRAOCULAR | Status: DC | PRN
  Administered 2015-11-29: 13:00:00

## 2015-11-29 MED ORDER — CLINDAMYCIN PHOSPHATE 600 MG/50ML IV SOLN
INTRAVENOUS | Status: AC
  Administered 2015-11-29: 600 mg via INTRAVENOUS
  Filled 2015-11-29: qty 50

## 2015-11-29 MED ORDER — HEMOSTATIC AGENTS (NO CHARGE) OPTIME
TOPICAL | Status: DC | PRN
  Administered 2015-11-29: 1 via TOPICAL

## 2015-11-29 MED ORDER — BACITRACIN-POLYMYXIN B 500-10000 UNIT/GM OP OINT
TOPICAL_OINTMENT | OPHTHALMIC | Status: AC
  Filled 2015-11-29: qty 3.5

## 2015-11-29 MED ORDER — PREDNISOLONE ACETATE 1 % OP SUSP
1.0000 [drp] | Freq: Four times a day (QID) | OPHTHALMIC | Status: DC
  Filled 2015-11-29: qty 5

## 2015-11-29 MED ORDER — GLIPIZIDE ER 5 MG PO TB24
5.0000 mg | ORAL_TABLET | Freq: Every day | ORAL | Status: DC
  Administered 2015-11-29: 5 mg via ORAL
  Filled 2015-11-29 (×2): qty 1

## 2015-11-29 MED ORDER — SODIUM HYALURONATE 10 MG/ML IO SOLN
INTRAOCULAR | Status: DC | PRN
  Administered 2015-11-29: 0.85 mL via INTRAOCULAR

## 2015-11-29 MED ORDER — BSS PLUS IO SOLN
INTRAOCULAR | Status: AC
  Filled 2015-11-29: qty 500

## 2015-11-29 MED ORDER — STERILE WATER FOR IRRIGATION IR SOLN
Status: DC | PRN
  Administered 2015-11-29: 200 mL

## 2015-11-29 MED ORDER — DEXAMETHASONE SODIUM PHOSPHATE 10 MG/ML IJ SOLN
INTRAMUSCULAR | Status: DC | PRN
  Administered 2015-11-29: 10 mg

## 2015-11-29 MED ORDER — BUPIVACAINE HCL (PF) 0.75 % IJ SOLN
INTRAMUSCULAR | Status: DC | PRN
  Administered 2015-11-29: 10 mL

## 2015-11-29 MED ORDER — HYDROCODONE-ACETAMINOPHEN 5-325 MG PO TABS: 1.0000 | ORAL_TABLET | ORAL | Status: DC | PRN

## 2015-11-29 MED ORDER — ARTIFICIAL TEARS OP OINT
TOPICAL_OINTMENT | OPHTHALMIC | Status: DC | PRN
  Administered 2015-11-29: 1 via OPHTHALMIC

## 2015-11-29 MED ORDER — BACITRACIN-POLYMYXIN B 500-10000 UNIT/GM OP OINT
TOPICAL_OINTMENT | OPHTHALMIC | Status: DC | PRN
Start: 2015-11-29 — End: 2015-11-29
  Administered 2015-11-29: 1 via OPHTHALMIC

## 2015-11-29 MED ORDER — CYCLOPENTOLATE HCL 1 % OP SOLN
1.0000 [drp] | OPHTHALMIC | Status: AC | PRN
  Administered 2015-11-29 (×2): 1 [drp] via OPHTHALMIC

## 2015-11-29 MED ORDER — ONDANSETRON HCL 4 MG/2ML IJ SOLN: 4.0000 mg | Freq: Once | INTRAMUSCULAR | Status: DC | PRN

## 2015-11-29 MED ORDER — STERILE WATER FOR INJECTION IJ SOLN
INTRAMUSCULAR | Status: DC | PRN
  Administered 2015-11-29: 20 mL

## 2015-11-29 MED ORDER — LIDOCAINE 2% (20 MG/ML) 5 ML SYRINGE
INTRAMUSCULAR | Status: AC
  Filled 2015-11-29: qty 5

## 2015-11-29 MED ORDER — BRIMONIDINE TARTRATE 0.15 % OP SOLN: 1.0000 [drp] | Freq: Two times a day (BID) | OPHTHALMIC | Status: DC

## 2015-11-29 MED ORDER — SODIUM CHLORIDE 0.45 % IV SOLN
INTRAVENOUS | Status: DC
  Administered 2015-11-29: 18:00:00 via INTRAVENOUS

## 2015-11-29 MED ORDER — FENTANYL CITRATE (PF) 100 MCG/2ML IJ SOLN
INTRAMUSCULAR | Status: DC | PRN
  Administered 2015-11-29 (×3): 50 ug via INTRAVENOUS

## 2015-11-29 MED ORDER — ADULT MULTIVITAMIN W/MINERALS CH
1.0000 | ORAL_TABLET | Freq: Every day | ORAL | Status: DC
  Administered 2015-11-29: 1 via ORAL
  Filled 2015-11-29 (×2): qty 1

## 2015-11-29 MED ORDER — AMLODIPINE BESYLATE 10 MG PO TABS
10.0000 mg | ORAL_TABLET | Freq: Once | ORAL | Status: DC
  Filled 2015-11-29: qty 1

## 2015-11-29 MED ORDER — ACETAMINOPHEN 325 MG PO TABS: 325.0000 mg | ORAL_TABLET | ORAL | Status: DC | PRN

## 2015-11-29 MED ORDER — BSS IO SOLN
INTRAOCULAR | Status: AC
  Filled 2015-11-29: qty 15

## 2015-11-29 SURGICAL SUPPLY — 72 items
APL SRG 3 HI ABS STRL LF PLS (MISCELLANEOUS)
APPLICATOR DR MATTHEWS STRL (MISCELLANEOUS) IMPLANT
BALL CTTN LRG ABS STRL LF (GAUZE/BANDAGES/DRESSINGS) ×3
BLADE EYE CATARACT 19 1.4 BEAV (BLADE) IMPLANT
BLADE KERATOME 2.75 (BLADE) ×2 IMPLANT
BLADE KERATOME 2.75MM (BLADE) ×1
CANNULA VLV SOFT TIP 25G (OPHTHALMIC) ×1 IMPLANT
CANNULA VLV SOFT TIP 25GA (OPHTHALMIC) ×3 IMPLANT
CORDS BIPOLAR (ELECTRODE) ×3 IMPLANT
COTTONBALL LRG STERILE PKG (GAUZE/BANDAGES/DRESSINGS) ×9 IMPLANT
COVER MAYO STAND STRL (DRAPES) ×3 IMPLANT
DRAPE INCISE 51X51 W/FILM STRL (DRAPES) IMPLANT
DRAPE OPHTHALMIC 77X100 STRL (CUSTOM PROCEDURE TRAY) ×3 IMPLANT
FILTER BLUE MILLIPORE (MISCELLANEOUS) IMPLANT
FORCEPS ECKARDT ILM 25G SERR (OPHTHALMIC RELATED) ×2 IMPLANT
FORCEPS GRIESHABER ILM 25G A (INSTRUMENTS) ×3 IMPLANT
FORCEPS HORIZONTAL 25G DISP (OPHTHALMIC RELATED) IMPLANT
GAS OPHTHALMIC (MISCELLANEOUS) IMPLANT
GAUZE SPONGE 2X2 8PLY STRL LF (GAUZE/BANDAGES/DRESSINGS) IMPLANT
GLOVE SS BIOGEL STRL SZ 6.5 (GLOVE) ×2 IMPLANT
GLOVE SS BIOGEL STRL SZ 7 (GLOVE) ×1 IMPLANT
GLOVE SUPERSENSE BIOGEL SZ 6.5 (GLOVE) ×4
GLOVE SUPERSENSE BIOGEL SZ 7 (GLOVE) ×4
GLOVE SURG 8.5 LATEX PF (GLOVE) ×3 IMPLANT
GLOVE SURG SS PI 7.0 STRL IVOR (GLOVE) ×2 IMPLANT
GOWN STRL REUS W/ TWL LRG LVL3 (GOWN DISPOSABLE) ×3 IMPLANT
GOWN STRL REUS W/TWL LRG LVL3 (GOWN DISPOSABLE) ×9
HANDLE PNEUMATIC FOR CONSTEL (OPHTHALMIC) ×3 IMPLANT
KIT BASIN OR (CUSTOM PROCEDURE TRAY) ×3 IMPLANT
KIT ROOM TURNOVER OR (KITS) ×3 IMPLANT
KNIFE CRESCENT 1.75 EDGEAHEAD (BLADE) IMPLANT
LENS IOL POST 1PIECE DIOP 21.0 (Intraocular Lens) ×2 IMPLANT
MICROPICK 25G (MISCELLANEOUS)
NDL 18GX1X1/2 (RX/OR ONLY) (NEEDLE) ×1 IMPLANT
NDL 25GX 5/8IN NON SAFETY (NEEDLE) ×1 IMPLANT
NDL 27GX1/2 REG BEVEL ECLIP (NEEDLE) IMPLANT
NDL FILTER BLUNT 18X1 1/2 (NEEDLE) ×1 IMPLANT
NDL HYPO 30X.5 LL (NEEDLE) ×1 IMPLANT
NEEDLE 18GX1X1/2 (RX/OR ONLY) (NEEDLE) ×3 IMPLANT
NEEDLE 25GX 5/8IN NON SAFETY (NEEDLE) ×3 IMPLANT
NEEDLE 27GX1/2 REG BEVEL ECLIP (NEEDLE) ×3 IMPLANT
NEEDLE FILTER BLUNT 18X 1/2SAF (NEEDLE) ×2
NEEDLE FILTER BLUNT 18X1 1/2 (NEEDLE) ×1 IMPLANT
NEEDLE HYPO 30X.5 LL (NEEDLE) ×3 IMPLANT
NS IRRIG 1000ML POUR BTL (IV SOLUTION) ×3 IMPLANT
PACK VITRECTOMY CUSTOM (CUSTOM PROCEDURE TRAY) ×3 IMPLANT
PAD ARMBOARD 7.5X6 YLW CONV (MISCELLANEOUS) ×4 IMPLANT
PAK PIK VITRECTOMY CVS 25GA (OPHTHALMIC) ×3 IMPLANT
PENCIL BIPOLAR 25GA STR DISP (OPHTHALMIC RELATED) ×2 IMPLANT
PIC ILLUMINATED 25G (OPHTHALMIC) ×3
PICK MICROPICK 25G (MISCELLANEOUS) IMPLANT
PIK ILLUMINATED 25G (OPHTHALMIC) ×1 IMPLANT
PROBE LASER ILLUM FLEX CVD 25G (OPHTHALMIC) ×2 IMPLANT
ROLLS DENTAL (MISCELLANEOUS) ×6 IMPLANT
SCRAPER DIAMOND 25GA (OPHTHALMIC RELATED) IMPLANT
SPONGE GAUZE 2X2 STER 10/PKG (GAUZE/BANDAGES/DRESSINGS) ×2
SPONGE SURGIFOAM ABS GEL 12-7 (HEMOSTASIS) ×3 IMPLANT
STOPCOCK 4 WAY LG BORE MALE ST (IV SETS) IMPLANT
SUT CHROMIC 7 0 TG140 8 (SUTURE) ×3 IMPLANT
SUT ETHILON 10 0 CS140 6 (SUTURE) ×3 IMPLANT
SUT ETHILON 9 0 TG140 8 (SUTURE) IMPLANT
SUT POLY NON ABSORB 10-0 8 STR (SUTURE) ×6 IMPLANT
SUT SILK 4 0 RB 1 (SUTURE) ×2 IMPLANT
SYR 20CC LL (SYRINGE) ×3 IMPLANT
SYR 5ML LL (SYRINGE) IMPLANT
SYR BULB 3OZ (MISCELLANEOUS) ×3 IMPLANT
SYR TB 1ML LUER SLIP (SYRINGE) ×3 IMPLANT
SYRINGE 10CC LL (SYRINGE) ×2 IMPLANT
TAPE SURG TRANSPORE 1 IN (GAUZE/BANDAGES/DRESSINGS) ×1 IMPLANT
TAPE SURGICAL TRANSPORE 1 IN (GAUZE/BANDAGES/DRESSINGS)
TUBING HIGH PRESS EXTEN 6IN (TUBING) IMPLANT
WATER STERILE IRR 1000ML POUR (IV SOLUTION) ×3 IMPLANT

## 2015-11-29 NOTE — Anesthesia Preprocedure Evaluation (Addendum)
Anesthesia Evaluation  Patient identified by MRN, date of birth, ID band Patient awake    Reviewed: Allergy & Precautions, NPO status , Patient's Chart, lab work & pertinent test results  Airway Mallampati: II  TM Distance: >3 FB Neck ROM: Full    Dental  (+) Teeth Intact, Dental Advisory Given, Missing   Pulmonary neg pulmonary ROS,    Pulmonary exam normal breath sounds clear to auscultation       Cardiovascular hypertension, Pt. on medications  Rhythm:Regular Rate:Tachycardia  Got amlodipine in HA   Neuro/Psych Cerebral palsy.  Slight frog legs and footdrop. TIACVA    GI/Hepatic Neg liver ROS, GERD  Medicated and Controlled,  Endo/Other  diabetes, Well Controlled, Type 2, Oral Hypoglycemic AgentsSarcoidosis  Renal/GU negative Renal ROS     Musculoskeletal negative musculoskeletal ROS (+)   Abdominal   Peds  Hematology negative hematology ROS (+)   Anesthesia Other Findings Day of surgery medications reviewed with the patient. Missing back right lower molar  Reproductive/Obstetrics                          Anesthesia Physical Anesthesia Plan  ASA: III  Anesthesia Plan: General   Post-op Pain Management:    Induction: Intravenous  Airway Management Planned: Oral ETT  Additional Equipment:   Intra-op Plan:   Post-operative Plan: Extubation in OR  Informed Consent: I have reviewed the patients History and Physical, chart, labs and discussed the procedure including the risks, benefits and alternatives for the proposed anesthesia with the patient or authorized representative who has indicated his/her understanding and acceptance.   Dental advisory given  Plan Discussed with: CRNA  Anesthesia Plan Comments: (Risks/benefits of general anesthesia discussed with patient including risk of damage to teeth, lips, gum, and tongue, nausea/vomiting, allergic reactions to medications, and  the possibility of heart attack, stroke and death.  All patient questions answered.  Patient wishes to proceed.)        Anesthesia Quick Evaluation

## 2015-11-29 NOTE — Anesthesia Procedure Notes (Signed)
Procedure Name: Intubation Date/Time: 11/29/2015 12:49 PM Performed by: Darcey NoraJAMES, Sirenia Whitis B Pre-anesthesia Checklist: Patient identified, Emergency Drugs available, Suction available and Patient being monitored Patient Re-evaluated:Patient Re-evaluated prior to inductionOxygen Delivery Method: Circle system utilized Preoxygenation: Pre-oxygenation with 100% oxygen Intubation Type: IV induction Ventilation: Mask ventilation without difficulty Laryngoscope Size: Mac and 3 (Dr. Desmond Lopeurk) Grade View: Grade III Tube type: Oral Tube size: 7.5 mm Number of attempts: 1 Airway Equipment and Method: Stylet Secured at: 22 (cm at teeth) cm Tube secured with: Tape Dental Injury: Teeth and Oropharynx as per pre-operative assessment  Comments: Anterior cords

## 2015-11-29 NOTE — Brief Op Note (Signed)
11/29/2015  2:44 PM  PATIENT:  Julia [name]  82 y.o. female  PRE-OPERATIVE DIAGNOSIS:  retained lens material left eye aphakia left eye  POST-OPERATIVE DIAGNOSIS:  retained lens material left eye aphakia left eye  PROCEDURE:  Procedure(s): PARS PLANA VITRECTOMY WITH 25G REMOVAL/SUTURE INTRAOCULAR LENS LEFT EYE (Left) LASER PHOTO ABLATION LEFT EYE (Left) GAS/FLUID EXCHANGE (Left)  SURGEON:  Surgeon(s) and Role:    * Sherrie GeorgeJohn D [name], MD - Primary  Brief Operative note   Preoperative diagnosis:  retained lens material left eye aphakia left eye *  Procedures: Pars plana vitrectomy, laser, removal of retained lens material, placement of secondary intraocular lens left eye  Surgeon:  Sherrie GeorgeJohn D [name], MD...  Assistant:  Rosalie DoctorLisa Johnson SA   Anesthesia: General  Specimen: none  Estimated blood loss:  1cc  Complications: none  Patient sent to PACU in good condition  Composed by Sherrie GeorgeJohn D [name] MD  Dictation number: [id number]

## 2015-11-29 NOTE — H&P (Signed)
I examined the patient today and there is no change in the medical status 

## 2015-11-29 NOTE — Transfer of Care (Signed)
Immediate Anesthesia Transfer of Care Note  Patient: Julia [name]  Procedure(s) Performed: Procedure(s): PARS PLANA VITRECTOMY WITH 25G REMOVAL/SUTURE INTRAOCULAR LENS LEFT EYE (Left) LASER PHOTO ABLATION LEFT EYE (Left) GAS/FLUID EXCHANGE (Left)  Patient Location: PACU  Anesthesia Type:General  Level of Consciousness: awake and patient cooperative  Airway & Oxygen Therapy: Patient Spontanous Breathing and Patient connected to nasal cannula oxygen  Post-op Assessment: Report given to RN and Post -op Vital signs reviewed and stable  Post vital signs: Reviewed and stable  Last Vitals:  Filed Vitals:   11/29/15 0933  BP: 186/98  Pulse: 110  Temp: 37 C  Resp: 16    Last Pain: There were no vitals filed for this visit.       Complications: No apparent anesthesia complications

## 2015-11-30 ENCOUNTER — Encounter (HOSPITAL_COMMUNITY): Payer: Self-pay | Admitting: Ophthalmology

## 2015-11-30 DIAGNOSIS — H2702 Aphakia, left eye: Secondary | ICD-10-CM | POA: Diagnosis not present

## 2015-11-30 MED ORDER — ERYTHROMYCIN 5 MG/GM OP OINT
TOPICAL_OINTMENT | Freq: Three times a day (TID) | OPHTHALMIC | Status: DC
  Filled 2015-11-30: qty 3.5

## 2015-11-30 MED ORDER — LATANOPROST 0.005 % OP SOLN: 1.0000 [drp] | Freq: Every day | OPHTHALMIC | Status: DC

## 2015-11-30 MED ORDER — BRIMONIDINE TARTRATE 0.2 % OP SOLN: 1.0000 [drp] | Freq: Two times a day (BID) | OPHTHALMIC | Status: DC

## 2015-11-30 MED ORDER — ERYTHROMYCIN 5 MG/GM OP OINT: TOPICAL_OINTMENT | Freq: Three times a day (TID) | OPHTHALMIC | Status: DC

## 2015-11-30 MED ORDER — GATIFLOXACIN 0.5 % OP SOLN: 1.0000 [drp] | Freq: Four times a day (QID) | OPHTHALMIC | Status: AC

## 2015-11-30 MED ORDER — PREDNISOLONE ACETATE 1 % OP SUSP: 1.0000 [drp] | Freq: Four times a day (QID) | OPHTHALMIC | Status: DC

## 2015-11-30 NOTE — Discharge Summary (Signed)
Discharge summary not needed on [facility] patients per medical records. 

## 2015-11-30 NOTE — Progress Notes (Signed)
11/30/2015, 6:36 AM  Mental Status:  Awake, Alert, Oriented  Anterior segment: Cornea  Mild corneal edema    Anterior Chamber Clear    Lens:   IOL  Intra Ocular Pressure 26 mmHg with Tonopen  Vitreous: Clear 30%gas bubble   Retina:  Attached Good laser reaction   Impression: Excellent result Retina attached Hazy view  Final Diagnosis: Principal Problem:   Retained lens material following cataract surgery, left eye Active Problems:   Aphakia, left eye   Retained lens material following cataract surgery of left eye   Plan: start post operative eye drops.  Discharge to home.  Give post operative instructions  Julia GeorgeMATTHEWS, Julia [name] 11/30/2015, 6:36 AM

## 2015-11-30 NOTE — Progress Notes (Signed)
Pt discharged to home.  Discharge instructions explained to pt. Pt has no questions at the time of discharge.  Pt taken off unit in her own personal wheelchair by staff.

## 2015-11-30 NOTE — Op Note (Signed)
NAMEJESSAMINE, Julia [name]                 ACCOUNT NO.:  [id number]  MEDICAL RECORD NO.:  [id number]  LOCATION:  [id number]                        FACILITY:  [facility]  PHYSICIAN:  Beulah Gandy. Ashley Royalty, M.D. DATE OF BIRTH:  June 06, 1932  DATE OF PROCEDURE:  11/29/2015 DATE OF DISCHARGE:                              OPERATIVE REPORT   ADMISSION DIAGNOSIS:  Retained lens material, left eye.  Aphakia, left eye.  Corneal edema, left eye.  PROCEDURES:  Pars plana vitrectomy, removal of lens material from the anterior segment, removal of lens material from the vitreous cavity, endolaser, placement of secondary intraocular lens with suture; all in the left eye.  SURGEON:  Beulah Gandy. Ashley Royalty, M.D.  ASSISTANT:  Rosalie Doctor, SA.  ANESTHESIA:  General.  DETAILS:  Usual prep and drape.  Conjunctival peritomy from 8 o'clock around to 4 o'clock to accommodate the secondary intraocular lens placement.  25-gauge trocars placed at 10, 2, and 4 o'clock.  Half- thickness scleral flaps were raised at 3 o'clock and 9 o'clock in anticipation of IOL suture.  Corneal scleral wound was made 7 mm in width from 10-2 o'clock.  A 3 layered wound was created starting at 4 mm back from the limbus, then dissecting up into the cornea, and entry into the anterior chamber with the keratome.  Pars plana vitrectomy was begun just in the anterior segment where a large clumps of lens material were seen in the anterior chamber in the inferior angle and attached to the cornea.  These lens remnants were removed carefully under low suction and rapid cutting.  The vitrectomy was carried posteriorly, Provisc placed on the corneal surface on the BIOM viewing system and moved into place.  Retained lens material was encountered in the vitreous cavity. These white fluffy pieces of material were removed carefully under low suction and rapid cutting.  Scleral depression was used to gain access to the vitreous base where additional lens material was  seen and removed.  Once all the lens material was removed, the endolaser was positioned in the eye, 729 burns were placed around the retinal periphery.  The power was 300 mW 1000 microns each and 0.1 seconds each. Attention was then carried to the limbus, where the corneal scleral wound was dissected again.  Two 10-0 Prolene sutures were passed beneath the scleral flap behind the iris and in the ciliary sulcus from 3 to 9 o'clock, a docking needle was used.  Once the 2 sutures were passed, the corneoscleral wound was opened.  The sutures were drawn through the pupil and out through this corneoscleral wound.  The new intraocular lens made by [employer], model [id number] power 21.0 d length 12.5 mm optic 7.0 mm serial #[id number] was inspected and cleaned. The eyelets of the lens were attached to the Prolene sutures.  The lens was placed through the corneoscleral wound into the anterior chamber, then into the posterior chamber.  Provisc was used in the anterior chamber.  Once the lens was placed in the posterior chamber and the ciliary sulcus, it was dialed into place.  The Prolene sutures were drawn, securely knotted and the free ends removed.  The scleral flaps were allowed  to close over the Prolene knots.  The corneal scleral wound was closed with 5 interrupted 10-0 nylon sutures.  Sutures were drawn securely, knotted, and the free ends removed.  The wound was tested and found to be secure.  Provisc had been removed from the anterior chamber prior to wound closure.  Pars plana vitrectomy again was performed and some tiny amounts of blood were seen at the edge of the intraocular lens.  The vitreous cavity was clear and there was no remaining lens material.  A 30% percent gas-fluid exchange was carried out.  The instruments were removed from the eye.  25-gauge trocars were removed from the eye.  The conjunctiva was closed with 7-0 chromic suture.  The scleral flaps were placed  beneath the conjunctiva and back into their original position.  Polymyxin and ceftazidime were injected around the globe for postop pain.  Decadron 10 mg was injected into the lower subconjunctival space.  Marcaine was injected around the globe for postop pain.  Closing pressure was 10 with Barraquer tonometer. Polysporin ophthalmic ointment, a patch and shield were placed.  The patient was awakened and taken to recovery in satisfactory condition.  COMPLICATIONS:  None.  DURATION:  1 hour 30 minutes.     Beulah GandyJohn D. Ashley RoyaltyMatthews, M.D.     JDM/MEDQ  D:  11/29/2015  T:  11/30/2015  Job:  [id number]

## 2015-11-30 NOTE — Anesthesia Postprocedure Evaluation (Signed)
Anesthesia Post Note  Patient: Julia SidleVina N Demelo  Procedure(s) Performed: Procedure(s) (LRB): PARS PLANA VITRECTOMY WITH 25G REMOVAL/SUTURE INTRAOCULAR LENS LEFT EYE (Left) LASER PHOTO ABLATION LEFT EYE (Left) GAS/FLUID EXCHANGE (Left)  Patient location during evaluation: PACU Anesthesia Type: General Level of consciousness: awake and alert Pain management: pain level controlled Vital Signs Assessment: post-procedure vital signs reviewed and stable Respiratory status: spontaneous breathing, nonlabored ventilation, respiratory function stable and patient connected to nasal cannula oxygen Cardiovascular status: blood pressure returned to baseline and stable Postop Assessment: no signs of nausea or vomiting Anesthetic complications: no    Last Vitals:  Filed Vitals:   11/30/15 0229 11/30/15 0446  BP: 155/78 162/60  Pulse: 86 86  Temp: 37 C 37 C  Resp: 18 17    Last Pain: There were no vitals filed for this visit.               Cecile HearingStephen Edward Turk

## 2015-12-06 ENCOUNTER — Inpatient Hospital Stay (INDEPENDENT_AMBULATORY_CARE_PROVIDER_SITE_OTHER): Payer: Medicare Other | Admitting: Ophthalmology

## 2015-12-06 DIAGNOSIS — H2702 Aphakia, left eye: Secondary | ICD-10-CM

## 2015-12-28 ENCOUNTER — Encounter (INDEPENDENT_AMBULATORY_CARE_PROVIDER_SITE_OTHER): Payer: Medicare Other | Admitting: Ophthalmology

## 2015-12-29 ENCOUNTER — Encounter (INDEPENDENT_AMBULATORY_CARE_PROVIDER_SITE_OTHER): Payer: Medicare Other | Admitting: Ophthalmology

## 2015-12-29 DIAGNOSIS — H2702 Aphakia, left eye: Secondary | ICD-10-CM

## 2016-03-08 ENCOUNTER — Encounter (INDEPENDENT_AMBULATORY_CARE_PROVIDER_SITE_OTHER): Payer: Medicare Other | Admitting: Ophthalmology

## 2016-12-10 ENCOUNTER — Other Ambulatory Visit: Payer: Self-pay | Admitting: Internal Medicine

## 2016-12-10 DIAGNOSIS — Z1231 Encounter for screening mammogram for malignant neoplasm of breast: Secondary | ICD-10-CM

## 2016-12-25 ENCOUNTER — Ambulatory Visit: Payer: Medicare Other

## 2017-01-14 ENCOUNTER — Ambulatory Visit: Payer: Medicare Other

## 2017-01-30 ENCOUNTER — Ambulatory Visit: Payer: Medicare Other

## 2017-11-29 ENCOUNTER — Other Ambulatory Visit: Payer: Self-pay | Admitting: Internal Medicine

## 2017-11-29 DIAGNOSIS — E049 Nontoxic goiter, unspecified: Secondary | ICD-10-CM

## 2017-12-10 ENCOUNTER — Ambulatory Visit
Admission: RE | Admit: 2017-12-10 | Discharge: 2017-12-10 | Disposition: A | Discharge status: Home / Self Care | Payer: Medicare Other | Source: Ambulatory Visit | Attending: Internal Medicine | Admitting: Internal Medicine

## 2017-12-10 DIAGNOSIS — E049 Nontoxic goiter, unspecified: Secondary | ICD-10-CM

## 2019-01-20 ENCOUNTER — Other Ambulatory Visit: Payer: Self-pay | Admitting: Internal Medicine

## 2019-01-20 DIAGNOSIS — Z1231 Encounter for screening mammogram for malignant neoplasm of breast: Secondary | ICD-10-CM

## 2019-03-06 ENCOUNTER — Other Ambulatory Visit: Payer: Self-pay

## 2019-03-06 ENCOUNTER — Ambulatory Visit
Admission: RE | Admit: 2019-03-06 | Discharge: 2019-03-06 | Disposition: A | Discharge status: Home / Self Care | Payer: Medicare Other | Source: Ambulatory Visit | Attending: Internal Medicine | Admitting: Internal Medicine

## 2019-03-06 DIAGNOSIS — Z1231 Encounter for screening mammogram for malignant neoplasm of breast: Secondary | ICD-10-CM

## 2019-05-17 ENCOUNTER — Emergency Department (HOSPITAL_COMMUNITY): Payer: Medicare Other

## 2019-05-17 ENCOUNTER — Encounter (HOSPITAL_COMMUNITY): Payer: Self-pay

## 2019-05-17 ENCOUNTER — Other Ambulatory Visit: Payer: Self-pay

## 2019-05-17 ENCOUNTER — Inpatient Hospital Stay (HOSPITAL_COMMUNITY)
Admission: EM | Admit: 2019-05-17 | Discharge: 2019-05-21 | DRG: 683 | Disposition: A | Discharge status: Skilled Nursing Facility | Payer: Medicare Other | Attending: Internal Medicine | Admitting: Internal Medicine

## 2019-05-17 DIAGNOSIS — E119 Type 2 diabetes mellitus without complications: Secondary | ICD-10-CM | POA: Diagnosis not present

## 2019-05-17 DIAGNOSIS — G809 Cerebral palsy, unspecified: Secondary | ICD-10-CM | POA: Diagnosis not present

## 2019-05-17 DIAGNOSIS — R531 Weakness: Secondary | ICD-10-CM

## 2019-05-17 DIAGNOSIS — E1151 Type 2 diabetes mellitus with diabetic peripheral angiopathy without gangrene: Secondary | ICD-10-CM | POA: Diagnosis not present

## 2019-05-17 DIAGNOSIS — M6282 Rhabdomyolysis: Secondary | ICD-10-CM

## 2019-05-17 DIAGNOSIS — N179 Acute kidney failure, unspecified: Secondary | ICD-10-CM | POA: Diagnosis not present

## 2019-05-17 DIAGNOSIS — E559 Vitamin D deficiency, unspecified: Secondary | ICD-10-CM | POA: Diagnosis present

## 2019-05-17 DIAGNOSIS — R7401 Elevation of levels of liver transaminase levels: Secondary | ICD-10-CM | POA: Diagnosis not present

## 2019-05-17 DIAGNOSIS — Z90710 Acquired absence of both cervix and uterus: Secondary | ICD-10-CM

## 2019-05-17 DIAGNOSIS — E860 Dehydration: Secondary | ICD-10-CM | POA: Diagnosis present

## 2019-05-17 DIAGNOSIS — Z7982 Long term (current) use of aspirin: Secondary | ICD-10-CM

## 2019-05-17 DIAGNOSIS — E1165 Type 2 diabetes mellitus with hyperglycemia: Secondary | ICD-10-CM | POA: Diagnosis present

## 2019-05-17 DIAGNOSIS — R1314 Dysphagia, pharyngoesophageal phase: Secondary | ICD-10-CM | POA: Diagnosis present

## 2019-05-17 DIAGNOSIS — N289 Disorder of kidney and ureter, unspecified: Secondary | ICD-10-CM

## 2019-05-17 DIAGNOSIS — M5030 Other cervical disc degeneration, unspecified cervical region: Secondary | ICD-10-CM | POA: Diagnosis present

## 2019-05-17 DIAGNOSIS — Z880 Allergy status to penicillin: Secondary | ICD-10-CM

## 2019-05-17 DIAGNOSIS — Z8673 Personal history of transient ischemic attack (TIA), and cerebral infarction without residual deficits: Secondary | ICD-10-CM

## 2019-05-17 DIAGNOSIS — Z87442 Personal history of urinary calculi: Secondary | ICD-10-CM

## 2019-05-17 DIAGNOSIS — Z8249 Family history of ischemic heart disease and other diseases of the circulatory system: Secondary | ICD-10-CM

## 2019-05-17 DIAGNOSIS — Z823 Family history of stroke: Secondary | ICD-10-CM

## 2019-05-17 DIAGNOSIS — I10 Essential (primary) hypertension: Secondary | ICD-10-CM

## 2019-05-17 DIAGNOSIS — Z7984 Long term (current) use of oral hypoglycemic drugs: Secondary | ICD-10-CM

## 2019-05-17 DIAGNOSIS — D869 Sarcoidosis, unspecified: Secondary | ICD-10-CM | POA: Diagnosis present

## 2019-05-17 DIAGNOSIS — N390 Urinary tract infection, site not specified: Secondary | ICD-10-CM | POA: Diagnosis present

## 2019-05-17 DIAGNOSIS — Z993 Dependence on wheelchair: Secondary | ICD-10-CM

## 2019-05-17 DIAGNOSIS — Z20828 Contact with and (suspected) exposure to other viral communicable diseases: Secondary | ICD-10-CM | POA: Diagnosis present

## 2019-05-17 DIAGNOSIS — M2578 Osteophyte, vertebrae: Secondary | ICD-10-CM | POA: Diagnosis present

## 2019-05-17 DIAGNOSIS — K219 Gastro-esophageal reflux disease without esophagitis: Secondary | ICD-10-CM | POA: Diagnosis present

## 2019-05-17 LAB — CBC WITH DIFFERENTIAL/PLATELET
Abs Immature Granulocytes: 0.05 10*3/uL (ref 0.00–0.07)
Basophils Absolute: 0 10*3/uL (ref 0.0–0.1)
Basophils Relative: 0 %
Eosinophils Absolute: 0 10*3/uL (ref 0.0–0.5)
Eosinophils Relative: 0 %
HCT: 48.7 % — ABNORMAL HIGH (ref 36.0–46.0)
Hemoglobin: 15.5 g/dL — ABNORMAL HIGH (ref 12.0–15.0)
Immature Granulocytes: 0 %
Lymphocytes Relative: 13 %
Lymphs Abs: 1.6 10*3/uL (ref 0.7–4.0)
MCH: 25.3 pg — ABNORMAL LOW (ref 26.0–34.0)
MCHC: 31.8 g/dL (ref 30.0–36.0)
MCV: 79.6 fL — ABNORMAL LOW (ref 80.0–100.0)
Monocytes Absolute: 1 10*3/uL (ref 0.1–1.0)
Monocytes Relative: 8 %
Neutro Abs: 9.6 10*3/uL — ABNORMAL HIGH (ref 1.7–7.7)
Neutrophils Relative %: 79 %
Platelets: 242 10*3/uL (ref 150–400)
RBC: 6.12 MIL/uL — ABNORMAL HIGH (ref 3.87–5.11)
RDW: 15.6 % — ABNORMAL HIGH (ref 11.5–15.5)
WBC: 12.2 10*3/uL — ABNORMAL HIGH (ref 4.0–10.5)
nRBC: 0 % (ref 0.0–0.2)

## 2019-05-17 LAB — CK: Total CK: 1623 U/L — ABNORMAL HIGH (ref 38–234)

## 2019-05-17 LAB — URINALYSIS, ROUTINE W REFLEX MICROSCOPIC
Bilirubin Urine: NEGATIVE
Glucose, UA: >=500 mg/dL — AB
Ketones, ur: NEGATIVE mg/dL
Nitrite: NEGATIVE
Protein, ur: NEGATIVE mg/dL
Specific Gravity, Urine: 1.012 (ref 1.005–1.030)
WBC, UA: >50 WBC/hpf — ABNORMAL HIGH (ref 0–5)
pH: 7 (ref 5.0–8.0)

## 2019-05-17 LAB — URINALYSIS, COMPLETE (UACMP) WITH MICROSCOPIC
Bilirubin Urine: NEGATIVE
Glucose, UA: >=500 mg/dL — AB
Ketones, ur: NEGATIVE mg/dL
Nitrite: NEGATIVE
Protein, ur: NEGATIVE mg/dL
Specific Gravity, Urine: 1.01 (ref 1.005–1.030)
WBC, UA: >50 WBC/hpf — ABNORMAL HIGH (ref 0–5)
pH: 7 (ref 5.0–8.0)

## 2019-05-17 LAB — COMPREHENSIVE METABOLIC PANEL
ALT: 68 U/L — ABNORMAL HIGH (ref 0–44)
AST: 79 U/L — ABNORMAL HIGH (ref 15–41)
Albumin: 3.2 g/dL — ABNORMAL LOW (ref 3.5–5.0)
Alkaline Phosphatase: 101 U/L (ref 38–126)
Anion gap: 12 (ref 5–15)
BUN: 75 mg/dL — ABNORMAL HIGH (ref 8–23)
CO2: 24 mmol/L (ref 22–32)
Calcium: 9 mg/dL (ref 8.9–10.3)
Chloride: 107 mmol/L (ref 98–111)
Creatinine, Ser: 1.25 mg/dL — ABNORMAL HIGH (ref 0.44–1.00)
GFR calc Af Amer: 45 mL/min — ABNORMAL LOW (ref >60)
GFR calc non Af Amer: 39 mL/min — ABNORMAL LOW (ref >60)
Glucose, Bld: 248 mg/dL — ABNORMAL HIGH (ref 70–99)
Potassium: 4.3 mmol/L (ref 3.5–5.1)
Sodium: 143 mmol/L (ref 135–145)
Total Bilirubin: 1.6 mg/dL — ABNORMAL HIGH (ref 0.3–1.2)
Total Protein: 7 g/dL (ref 6.5–8.1)

## 2019-05-17 LAB — CBG MONITORING, ED: Glucose-Capillary: 230 mg/dL — ABNORMAL HIGH (ref 70–99)

## 2019-05-17 LAB — CREATININE, URINE, RANDOM: Creatinine, Urine: 31.29 mg/dL

## 2019-05-17 LAB — SODIUM, URINE, RANDOM: Sodium, Ur: 31 mmol/L

## 2019-05-17 LAB — GLUCOSE, CAPILLARY: Glucose-Capillary: 172 mg/dL — ABNORMAL HIGH (ref 70–99)

## 2019-05-17 MED ORDER — ONDANSETRON HCL 4 MG/2ML IJ SOLN: 4.0000 mg | Freq: Four times a day (QID) | INTRAMUSCULAR | Status: DC | PRN

## 2019-05-17 MED ORDER — SODIUM CHLORIDE 0.9 % IV BOLUS
1000.0000 mL | Freq: Once | INTRAVENOUS | Status: AC
  Administered 2019-05-17: 1000 mL via INTRAVENOUS

## 2019-05-17 MED ORDER — INSULIN ASPART 100 UNIT/ML ~~LOC~~ SOLN
0.0000 [IU] | Freq: Three times a day (TID) | SUBCUTANEOUS | Status: DC
  Administered 2019-05-18: 2 [IU] via SUBCUTANEOUS
  Administered 2019-05-18: 1 [IU] via SUBCUTANEOUS
  Administered 2019-05-18: 3 [IU] via SUBCUTANEOUS
  Administered 2019-05-19 (×2): 2 [IU] via SUBCUTANEOUS
  Administered 2019-05-19 – 2019-05-20 (×2): 3 [IU] via SUBCUTANEOUS
  Administered 2019-05-20 (×2): 1 [IU] via SUBCUTANEOUS
  Administered 2019-05-21: 2 [IU] via SUBCUTANEOUS

## 2019-05-17 MED ORDER — HEPARIN SODIUM (PORCINE) 5000 UNIT/ML IJ SOLN
5000.0000 [IU] | Freq: Three times a day (TID) | INTRAMUSCULAR | Status: DC
  Administered 2019-05-17 – 2019-05-21 (×11): 5000 [IU] via SUBCUTANEOUS
  Filled 2019-05-17 (×11): qty 1

## 2019-05-17 MED ORDER — BRIMONIDINE TARTRATE 0.15 % OP SOLN
1.0000 [drp] | Freq: Two times a day (BID) | OPHTHALMIC | Status: DC
  Administered 2019-05-17 – 2019-05-21 (×8): 1 [drp] via OPHTHALMIC
  Filled 2019-05-17: qty 5

## 2019-05-17 MED ORDER — LATANOPROST 0.005 % OP SOLN
1.0000 [drp] | Freq: Every day | OPHTHALMIC | Status: DC
  Administered 2019-05-17 – 2019-05-18 (×2): 1 [drp] via OPHTHALMIC
  Filled 2019-05-17: qty 2.5

## 2019-05-17 MED ORDER — SODIUM CHLORIDE 0.9 % IV SOLN: 1.0000 g | Freq: Once | INTRAVENOUS | Status: DC

## 2019-05-17 MED ORDER — INSULIN ASPART 100 UNIT/ML ~~LOC~~ SOLN
0.0000 [IU] | Freq: Every day | SUBCUTANEOUS | Status: DC
  Administered 2019-05-19: 2 [IU] via SUBCUTANEOUS
  Administered 2019-05-20: 3 [IU] via SUBCUTANEOUS

## 2019-05-17 MED ORDER — POLYETHYLENE GLYCOL 3350 17 G PO PACK: 17.0000 g | PACK | Freq: Every day | ORAL | Status: DC | PRN

## 2019-05-17 MED ORDER — ACETAMINOPHEN 325 MG PO TABS: 650.0000 mg | ORAL_TABLET | Freq: Four times a day (QID) | ORAL | Status: DC | PRN

## 2019-05-17 MED ORDER — AMLODIPINE BESYLATE 10 MG PO TABS
10.0000 mg | ORAL_TABLET | Freq: Every day | ORAL | Status: DC
  Administered 2019-05-18 – 2019-05-21 (×4): 10 mg via ORAL
  Filled 2019-05-17 (×4): qty 1

## 2019-05-17 MED ORDER — HYDROCODONE-ACETAMINOPHEN 5-325 MG PO TABS: 1.0000 | ORAL_TABLET | Freq: Four times a day (QID) | ORAL | Status: DC | PRN

## 2019-05-17 MED ORDER — GATIFLOXACIN 0.5 % OP SOLN
1.0000 [drp] | Freq: Four times a day (QID) | OPHTHALMIC | Status: DC
  Administered 2019-05-17 – 2019-05-21 (×13): 1 [drp] via OPHTHALMIC
  Filled 2019-05-17: qty 2.5

## 2019-05-17 MED ORDER — SODIUM CHLORIDE 0.9 % IV SOLN
INTRAVENOUS | Status: DC
  Administered 2019-05-17 – 2019-05-18 (×2): via INTRAVENOUS

## 2019-05-17 MED ORDER — ACETAMINOPHEN 650 MG RE SUPP: 650.0000 mg | Freq: Four times a day (QID) | RECTAL | Status: DC | PRN

## 2019-05-17 MED ORDER — ONDANSETRON HCL 4 MG PO TABS: 4.0000 mg | ORAL_TABLET | Freq: Four times a day (QID) | ORAL | Status: DC | PRN

## 2019-05-17 MED ORDER — DORZOLAMIDE HCL-TIMOLOL MAL 2-0.5 % OP SOLN
1.0000 [drp] | Freq: Two times a day (BID) | OPHTHALMIC | Status: DC
  Administered 2019-05-17 – 2019-05-21 (×8): 1 [drp] via OPHTHALMIC
  Filled 2019-05-17: qty 10

## 2019-05-17 MED ORDER — SODIUM CHLORIDE 0.9% FLUSH
3.0000 mL | Freq: Two times a day (BID) | INTRAVENOUS | Status: DC
  Administered 2019-05-17 – 2019-05-21 (×8): 3 mL via INTRAVENOUS

## 2019-05-17 NOTE — Progress Notes (Signed)
Pt arrived on unit A&Ox4. Educated regarding falls precautions and how to reach nurse. Instructed not to attempt to get out of bed without staff member

## 2019-05-17 NOTE — ED Notes (Addendum)
Pt taking po fluids and tolerating well.Per Dr. Theora Gianotti . Pt may eat and drink.

## 2019-05-17 NOTE — ED Triage Notes (Signed)
Pt asrrives EMS from home with c/o weakness since Tuesday. Pt had episode while doing laundry had knees "give way". Pt able to crawl afterward . C/o continued weakness.  Hx of Cerebal palsy. Family states pt needs home care and pt asking

## 2019-05-17 NOTE — ED Notes (Signed)
Pt taking po fluids and eating crackers.

## 2019-05-17 NOTE — H&P (Signed)
History and Physical    MANASVI DICKARD WUX:[id number] DOB: 07/08/1932 DOA: 05/17/2019  PCP: Gwenyth Bender, MD   Patient coming from: Home   Chief Complaint: Generalized weakness  HPI: Julia [name] is a 85 y.o. female with medical history significant for cerebral palsy, hypertension, and type 2 diabetes mellitus, now presenting to emergency department for evaluation of generalized weakness.  At her baseline, the patient is able to transfer between her wheelchair to bed and is able to ambulate some using crutches.  For the past several days, she has been to generally weak to transfer between her chair in bed and is unable to ambulate with her crutches.  Patient acknowledges that her weakness may have been worsening insidiously for quite some time but she was acutely worse on 05/12/2019 when she was unable to bear weight due to bilateral lower extremity weakness and slid to the floor where she had difficulty getting up and was able to crawl to her wheelchair without suffering any appreciable injury.  Since that time, she has felt too weak to transfer between her chair in bed, feels generally weak but more so on the right side.  Denies any headache, change in vision or hearing, or new numbness.  She denies any fevers or chills and denies any muscle aches.  She denies dysuria, hematuria, suprapubic discomfort, or flank pain.  ED Course: Upon arrival to the ED, patient is found to be afebrile, saturating mid 90s, and with stable blood pressure.  EKG features a sinus rhythm with nonspecific IVCD with LAD and LVH.  Noncontrast head CT is negative for acute intracranial abnormality and cervical spine CT is negative for acute fracture or subluxation.  MRI brain is also negative for acute findings but notable for progression and chronic signal changes that are suggestive of small vessel disease.  Chemistry panel is notable for mild elevation in transaminases, total bilirubin 1.6, glucose 248, BUN 75, and creatinine  1.25, up from 0.57 in 2017.  CBC is notable for leukocytosis to 12,200 and hemoglobin of 15.5.  Urinalysis with moderate hemoglobin.  COVID-19 testing ordered but not yet performed.  Patient was given a liter of normal saline in the emergency department.  Review of Systems:  All other systems reviewed and apart from HPI, are negative.  Past Medical History:  Diagnosis Date   Cerebral palsy (HCC)    Dizziness and giddiness    Essential hypertension, malignant    GERD (gastroesophageal reflux disease)    Hypokalemia    Kidney stones    Sarcoidosis    Type II or unspecified type diabetes mellitus without mention of complication, not stated as uncontrolled    Unspecified vitamin D deficiency     Past Surgical History:  Procedure Laterality Date   ABDOMINAL HYSTERECTOMY     cerebral palsy surgeries     CESAREAN SECTION     EYE SURGERY     GAS/FLUID EXCHANGE Left 11/29/2015   Procedure: GAS/FLUID EXCHANGE;  Surgeon: Sherrie George, MD;  Location: [facility] OR;  Service: Ophthalmology;  Laterality: Left;   LASER PHOTO ABLATION Left 11/29/2015   Procedure: LASER PHOTO ABLATION LEFT EYE;  Surgeon: Sherrie George, MD;  Location: [facility] OR;  Service: Ophthalmology;  Laterality: Left;   LUNG REMOVAL, PARTIAL     MULTIPLE TOOTH EXTRACTIONS     PARS PLANA VITRECTOMY Left 11/29/2015   WITH 25G REMOVAL/SUTURE INTRAOCULAR LENS LEFT EYE   PARS PLANA VITRECTOMY Left 11/29/2015   Procedure: PARS PLANA VITRECTOMY WITH  25G REMOVAL/SUTURE INTRAOCULAR LENS LEFT EYE;  Surgeon: Hayden Pedro, MD;  Location: [place];  Service: Ophthalmology;  Laterality: Left;   TONSILLECTOMY       reports that she has never smoked. She has never used smokeless tobacco. She reports that she does not drink alcohol or use drugs.  Allergies  Allergen Reactions   Penicillins Other (See Comments)    "Made my feet sore" in childhood Has patient had a PCN reaction causing immediate rash, facial/tongue/throat  swelling, SOB or lightheadedness with hypotension: no Has patient had a PCN reaction causing severe rash involving mucus membranes or skin necrosis: no Has patient had a PCN reaction that required hospitalization no Has patient had a PCN reaction occurring within the last 10 years: no If all of the above answers are "NO", then may proceed with Cephalosporin use.     Family History  Problem Relation Age of Onset   Hypertension Mother    Stroke Mother    Cancer Father      Prior to Admission medications   Medication Sig Start Date End Date Taking? Authorizing Provider  cholecalciferol (VITAMIN D) 1000 units tablet Take 1,000 Units by mouth daily.   Yes [provider]  dorzolamide-timolol (COSOPT) 22.3-6.8 MG/ML ophthalmic solution Place 1 drop into both eyes 2 (two) times daily.   Yes [provider]  latanoprost (XALATAN) 0.005 % ophthalmic solution Place 1 drop into both eyes at bedtime.   Yes [provider]  meclizine (ANTIVERT) 12.5 MG tablet Take 12.5 mg by mouth 4 (four) times daily as needed. Nerve 09/22/17  Yes [provider]  Multiple Vitamin (MULTIVITAMIN) tablet Take 1 tablet by mouth daily.     Yes [provider]  omeprazole (PRILOSEC) 20 MG capsule Take 20 mg by mouth 2 (two) times daily as needed (For heartburn or acid reflux.).  06/07/14  Yes [provider]  ALPHAGAN P 0.1 % SOLN Place 1 drop into both eyes 2 (two) times daily.  06/08/14   [provider]  amLODipine (NORVASC) 10 MG tablet Take 10 mg by mouth daily.      [provider]  aspirin EC 81 MG tablet Take 81 mg by mouth daily.    [provider]  brimonidine (ALPHAGAN) 0.2 % ophthalmic solution Place 1 drop into the left eye 2 (two) times daily. Patient not taking: Reported on 05/17/2019 11/30/15   Hayden Pedro, MD  gatifloxacin (ZYMAXID) 0.5 % SOLN Place 1 drop into the left eye 4 (four) times daily. 11/30/15   Hayden Pedro, MD  glipiZIDE (GLUCOTROL XL) 5 MG 24 hr tablet Take 5 mg by mouth daily. 11/07/15   [provider]  latanoprost (XALATAN) 0.005 % ophthalmic solution Place 1 drop into the left eye at bedtime. Patient not taking: Reported on 05/17/2019 11/30/15   Hayden Pedro, MD  losartan (COZAAR) 100 MG tablet Take 100 mg by mouth daily. 11/08/15   [provider]  metFORMIN (GLUCOPHAGE-XR) 500 MG 24 hr tablet Take 500 mg by mouth daily. 06/07/14   [provider]  prednisoLONE acetate (PRED FORTE) 1 % ophthalmic suspension Place 1 drop into the left eye 4 (four) times daily. Patient not taking: Reported on 05/17/2019 11/30/15   Hayden Pedro, MD  TRAVATAN Z 0.004 % SOLN ophthalmic solution Place 1 drop into both eyes at bedtime.  06/07/14   [provider]    Physical Exam: Vitals:   05/17/19 1815 05/17/19 1830 05/17/19  1845 05/17/19 1918  BP: (!) 109/91 (!) 117/100 (!) 144/126 (!) 132/97  Pulse: 89 77 92 83  Resp: 19 17 (!) 21   Temp:      TempSrc:      SpO2: 96% 96% 95% 96%  Weight:      Height:        Constitutional: NAD, calm  Eyes: PERTLA, lids and conjunctivae normal ENMT: Mucous membranes are moist. Posterior pharynx clear of any exudate or lesions.   Neck: normal, supple, no masses, no thyromegaly Respiratory: no wheezing, no crackles. Normal respiratory effort. No accessory muscle use.  Cardiovascular: S1 & S2 heard, regular rate and rhythm. No extremity edema.   Abdomen: No distension, no tenderness, soft. Bowel sounds normal.  Musculoskeletal: no clubbing / cyanosis. No joint deformity upper and lower extremities.    Skin: no significant rashes, lesions, ulcers. Poor turgor. Neurologic: No facial asymmetry, EOMI, PERRL. Sensation intact. Strength 3/5 involving RUE and RLE, 4/5 on left.   Psychiatric: Alert and oriented x 3. Very pleasant, cooperative.    Labs on Admission: I have personally reviewed following labs and imaging  studies  CBC: Recent Labs  Lab 05/17/19 1151  WBC 12.2*  NEUTROABS 9.6*  HGB 15.5*  HCT 48.7*  MCV 79.6*  PLT 242   Basic Metabolic Panel: Recent Labs  Lab 05/17/19 1151  NA 143  K 4.3  CL 107  CO2 24  GLUCOSE 248*  BUN 75*  CREATININE 1.25*  CALCIUM 9.0   GFR: Estimated Creatinine Clearance: 25.8 mL/min (A) (by C-G formula based on SCr of 1.25 mg/dL (H)). Liver Function Tests: Recent Labs  Lab 05/17/19 1151  AST 79*  ALT 68*  ALKPHOS 101  BILITOT 1.6*  PROT 7.0  ALBUMIN 3.2*   No results for input(s): LIPASE, AMYLASE in the last 168 hours. No results for input(s): AMMONIA in the last 168 hours. Coagulation Profile: No results for input(s): INR, PROTIME in the last 168 hours. Cardiac Enzymes: Recent Labs  Lab 05/17/19 1151  CKTOTAL 1,623*   BNP (last 3 results) No results for input(s): PROBNP in the last 8760 hours. HbA1C: No results for input(s): HGBA1C in the last 72 hours. CBG: Recent Labs  Lab 05/17/19 1111  GLUCAP 230*   Lipid Profile: No results for input(s): CHOL, HDL, LDLCALC, TRIG, CHOLHDL, LDLDIRECT in the last 72 hours. Thyroid Function Tests: No results for input(s): TSH, T4TOTAL, FREET4, T3FREE, THYROIDAB in the last 72 hours. Anemia Panel: No results for input(s): VITAMINB12, FOLATE, FERRITIN, TIBC, IRON, RETICCTPCT in the last 72 hours. Urine analysis:    Component Value Date/Time   COLORURINE YELLOW 05/17/2019 1919   APPEARANCEUR CLOUDY (A) 05/17/2019 1919   LABSPEC 1.012 05/17/2019 1919   PHURINE 7.0 05/17/2019 1919   GLUCOSEU >=500 (A) 05/17/2019 1919   HGBUR MODERATE (A) 05/17/2019 1919   BILIRUBINUR NEGATIVE 05/17/2019 1919   KETONESUR NEGATIVE 05/17/2019 1919   PROTEINUR NEGATIVE 05/17/2019 1919   UROBILINOGEN 0.2 06/22/2014 1409   NITRITE NEGATIVE 05/17/2019 1919   LEUKOCYTESUR LARGE (A) 05/17/2019 1919   Sepsis Labs: (procalcitonin:4,lacticidven:4) )No results found for this or any previous visit (from  the past 240 hour(s)).   Radiological Exams on Admission: Ct Head Wo Contrast  Result Date: 05/17/2019 CLINICAL DATA:  History of cerebral palsy, lower extremity weakness EXAM: CT HEAD WITHOUT CONTRAST CT CERVICAL SPINE WITHOUT CONTRAST TECHNIQUE: Multidetector CT imaging of the head and cervical spine was performed following the standard protocol without intravenous contrast. Multiplanar CT image reconstructions  of the cervical spine were also generated. COMPARISON:  06/22/2014 FINDINGS: CT HEAD FINDINGS Brain: No evidence of acute infarction, hemorrhage, hydrocephalus, extra-axial collection or mass lesion/mass effect. Periventricular and deep white matter hypodensity with unchanged foci of encephalomalacia of the left parietal lobe. Vascular: No hyperdense vessel or unexpected calcification. Skull: Normal. Negative for fracture or focal lesion. Sinuses/Orbits: No acute finding. Other: None. CT CERVICAL SPINE FINDINGS Alignment: Normal. Skull base and vertebrae: No acute fracture. No primary bone lesion or focal pathologic process. Soft tissues and spinal canal: No prevertebral fluid or swelling. No visible canal hematoma. Disc levels: Severe multilevel disc degenerative disease and osteophytosis of the cervical spine. Upper chest: Bandlike scarring of the included bilateral lung apices with wedge resection line of the left lung apex. Other: None. IMPRESSION: 1. No acute intracranial pathology. Small-vessel white matter disease and unchanged encephalomalacia of the left parietal lobe. 2. No fracture or static subluxation of the cervical spine. Severe multilevel disc degenerative disease and osteophytosis. Electronically Signed   By: Lauralyn Primes M.D.   On: 05/17/2019 13:30   Ct Cervical Spine Wo Contrast  Result Date: 05/17/2019 CLINICAL DATA:  History of cerebral palsy, lower extremity weakness EXAM: CT HEAD WITHOUT CONTRAST CT CERVICAL SPINE WITHOUT CONTRAST TECHNIQUE: Multidetector CT imaging of the  head and cervical spine was performed following the standard protocol without intravenous contrast. Multiplanar CT image reconstructions of the cervical spine were also generated. COMPARISON:  06/22/2014 FINDINGS: CT HEAD FINDINGS Brain: No evidence of acute infarction, hemorrhage, hydrocephalus, extra-axial collection or mass lesion/mass effect. Periventricular and deep white matter hypodensity with unchanged foci of encephalomalacia of the left parietal lobe. Vascular: No hyperdense vessel or unexpected calcification. Skull: Normal. Negative for fracture or focal lesion. Sinuses/Orbits: No acute finding. Other: None. CT CERVICAL SPINE FINDINGS Alignment: Normal. Skull base and vertebrae: No acute fracture. No primary bone lesion or focal pathologic process. Soft tissues and spinal canal: No prevertebral fluid or swelling. No visible canal hematoma. Disc levels: Severe multilevel disc degenerative disease and osteophytosis of the cervical spine. Upper chest: Bandlike scarring of the included bilateral lung apices with wedge resection line of the left lung apex. Other: None. IMPRESSION: 1. No acute intracranial pathology. Small-vessel white matter disease and unchanged encephalomalacia of the left parietal lobe. 2. No fracture or static subluxation of the cervical spine. Severe multilevel disc degenerative disease and osteophytosis. Electronically Signed   By: Lauralyn Primes M.D.   On: 05/17/2019 13:30   Mr Brain Wo Contrast  Result Date: 05/17/2019 CLINICAL DATA:  85 year old female with lower extremity weakness. EXAM: MRI HEAD WITHOUT CONTRAST TECHNIQUE: Multiplanar, multiecho pulse sequences of the brain and surrounding structures were obtained without intravenous contrast. COMPARISON:  Head and cervical spine CT earlier today. Brain MRI 06/22/2014. FINDINGS: Brain: No restricted diffusion or evidence of acute infarction. Chronic disproportionate volume loss in the parietal lobes greater on the left. Suggestion  of chronic cortical encephalomalacia in the left parietal lobe on series 12, image 17. Underlying lateral ventricle prominence (mostly colpocephaly) has not significantly changed since 2015. Superimposed confluent bilateral cerebral white matter T2 and FLAIR hyperintensity is chronic and involves the deep white matter capsules, mildly progressed since 2015. Superimposed bilateral basal ganglia and thalamic T2 heterogeneity has increased since 2015. Mild T2 heterogeneity in the pons has not significantly changed. No other cortical encephalomalacia identified. There are several chronic microhemorrhages in the brain, most notably the central brainstem on series 6, image 30. No midline shift, mass effect, evidence of mass lesion,  ventriculomegaly, extra-axial collection or acute intracranial hemorrhage. Cervicomedullary junction and pituitary are within normal limits. Vascular: Major intracranial vascular flow voids are stable since 2015. Skull and upper cervical spine: Cervical spine reported separately today. Visualized bone marrow signal is within normal limits. Sinuses/Orbits: Postoperative changes to both globes. Trace paranasal sinus mucosal thickening. Other: Mastoids are clear. Visible internal auditory structures appear normal. Scalp and face soft tissues appear negative. IMPRESSION: 1. No acute intracranial abnormality. 2. Chronic parietal lobe atrophy with suspected cortical encephalomalacia on the left. 3. Superimposed chronic but progressed since 2015 signal changes suggestive of chronic small vessel disease, including a small number of chronic micro-hemorrhages. Electronically Signed   By: Odessa Fleming M.D.   On: 05/17/2019 17:54   Mr Cervical Spine Wo Contrast  Result Date: 05/17/2019 CLINICAL DATA:  85 year old female with lower extremity weakness. EXAM: MRI CERVICAL SPINE WITHOUT CONTRAST TECHNIQUE: Multiplanar, multisequence MR imaging of the cervical spine was performed. No intravenous contrast was  administered. COMPARISON:  Brain MRI reported separately today. Cervical spine CT earlier today. FINDINGS: Alignment: Straightening of cervical lordosis as seen earlier today. Vertebrae: No marrow edema or evidence of acute osseous abnormality. Chronic degenerative endplate marrow signal changes. Cord: Spinal cord signal is within normal limits at all visualized levels. Posterior Fossa, vertebral arteries, paraspinal tissues: Cervicomedullary junction is within normal limits. Brain findings today reported separately. Preserved major vascular flow voids in the neck. Negative visible neck soft tissues. Disc levels: C2-C3: Disc and posterior element degeneration. No spinal stenosis. Moderate to severe bilateral C3 foraminal stenosis. C3-C4: Disc space loss with circumferential disc osteophyte complex and moderate posterior element hypertrophy greater on the left. Mild spinal stenosis, mild if any cord mass effect. Severe bilateral C4 foraminal stenosis. C4-C5: Right eccentric circumferential disc osteophyte complex. Mild facet hypertrophy. No spinal stenosis. Mild to moderate left and severe right C5 foraminal stenosis. C5-C6: Mostly far lateral and anterior circumferential disc osteophyte complex with mild facet hypertrophy. No spinal stenosis. Severe right greater than left C6 foraminal stenosis. C6-C7: Severe disc space loss with bulky circumferential disc osteophyte complex. Mild facet hypertrophy. Mild spinal stenosis, no cord mass effect. Moderate to severe left and severe right C7 foraminal stenosis. C7-T1: Circumferential disc bulge with endplate spurring. Mild posterior element hypertrophy. No spinal stenosis. Moderate to severe bilateral C8 foraminal stenosis. T1-T2 degeneration with foraminal but no spinal stenosis. IMPRESSION: 1.  No acute osseous abnormality in the cervical spine. 2. Diffuse cervical spine degeneration. - mild spinal stenosis at C3-C4 and C6-C7 with up to mild spinal cord mass effect, but  no cord signal abnormality. - diffuse moderate and severe bilateral cervical neural foraminal stenosis. Electronically Signed   By: Odessa Fleming M.D.   On: 05/17/2019 18:00    EKG: Independently reviewed. Sinus rhythm, non-specific IVCD with LAD, LVH.   Assessment/Plan   1. Rhabdomyolysis  - Presents with increased weakness, no longer able to use her crutches or transfer between chair and bed  - She is found to have CK of 1623 with renal insufficiency that is new from 2017  - She is not on a statin, denies fevers or myalgias, inflammatory myopathy less likely, only complaint with weakness   - Check UA with microscopy, continue IVF hydration, trend CK   2. Renal insufficiency  - BUN is 75 and SCr is 1.25 on admission, up from 12 and 0.57 in 2017  - She had N/V/D for two days recently, now resolved, and this may be acute prerenal azotemia though rhabdomyolysis  also a concern and she has been on losartan  - She was given 1 liter NS in ED  - Hold losartan, renally-dose medications, check FENa, continue IVF hydration, repeat chem panel in am    3. Generalized weakness; cerebral palsy   - Pt has cerebral palsy and had been ambulating with crutches some and able to transfer between wheelchair and bed until becoming increasingly weak over the past several days  - There is no acute finding on MRI brain and c-spine  - Increased weakness likely secondary to #1  - Continue IVF hydration, PT eval and tx,   4. Hypertension  - Hold losartan given increased creatinine  - Continue Norvasc as tolerated   5. Type II DM  - No recent A1c, serum glucose 248 on admission  - Managed at home with metformin and glipizide, held on admission  - Check CBG's and use a low-intensity SSI with Novlog for now    6. Elevated transaminases  - Abdominal exam benign, likely secondary to rhabdomyolysis - Continue IVF hydration, trend     PPE: Mask, face shield  DVT prophylaxis: sq heparin  Code Status: Full  Family  Communication: Discussed with patient  Consults called: None  Admission status: Observation    Briscoe Deutscherimothy S Mikkel Charrette, MD [facility] Pager [phone]  If 7PM-7AM, please contact night-coverage [email or web address] Password [email or web address]  05/17/2019, 8:14 PM

## 2019-05-17 NOTE — ED Provider Notes (Signed)
[facility] EMERGENCY DEPARTMENT Provider Note   CSN: [id number] Arrival date & time: 05/17/19  1102     History   Chief Complaint Chief Complaint  Patient presents with  . Weakness    HPI Julia [name] is a 85 y.o. female.     Patient is an 85 year old female with a history of cerebral palsy who has been highly functioning at home for most of her life and uses hand crutches and wheelchair, diabetes, sarcoidosis, stroke who presents today for weakness.  Patient states on Tuesday she was in her laundry room when her legs gave out and she slid to the floor.  She was able to crawl into her kitchen and get back in her wheelchair.  She denies hitting her head or any injuries from sliding to the floor but states she thinks it was about Wednesday she has had weakness in her upper body and has been unable to get into her wheelchair by herself or even get out of bed by herself.  She states this is very unusual in her arms are always very strong.  Family reported to EMS that she had not been eating as well and just did not seem herself and was not able to care for herself.  Patient denies any new medications, fever, cough or shortness of breath.  She has no pain anywhere.  She is complaining about feeling hungry and is requesting food and drink.  The history is provided by the patient.  Weakness Severity:  Moderate Onset quality:  Sudden Duration:  5 days Timing:  Constant Progression:  Unchanged Chronicity:  New   Past Medical History:  Diagnosis Date  . Cerebral palsy ([place])   . Dizziness and giddiness   . Essential hypertension, malignant   . GERD (gastroesophageal reflux disease)   . Hypokalemia   . Kidney stones   . Sarcoidosis   . Type II or unspecified type diabetes mellitus without mention of complication, not stated as uncontrolled   . Unspecified vitamin D deficiency     Patient Active Problem List   Diagnosis Date Noted  . Retained lens material  following cataract surgery, left eye 11/29/2015  . Aphakia, left eye 11/29/2015  . Retained lens material following cataract surgery of left eye 11/29/2015  . Facial numbness 06/22/2014  . Stroke, embolic ([place]) 56/43/3295  . TIA (transient ischemic attack) 06/22/2014  . HTN (hypertension) 06/22/2014  . DM type 2 without retinopathy ([place]) 06/22/2014  . Left jaw numbness 06/22/2014  . Cystic fibrosis ([place]) 06/22/2014  . Stroke [facility])     Past Surgical History:  Procedure Laterality Date  . ABDOMINAL HYSTERECTOMY    . cerebral palsy surgeries    . CESAREAN SECTION    . EYE SURGERY    . GAS/FLUID EXCHANGE Left 11/29/2015   Procedure: GAS/FLUID EXCHANGE;  Surgeon: Hayden Pedro, MD;  Location: [facility];  Service: Ophthalmology;  Laterality: Left;  . LASER PHOTO ABLATION Left 11/29/2015   Procedure: LASER PHOTO ABLATION LEFT EYE;  Surgeon: Hayden Pedro, MD;  Location: [place];  Service: Ophthalmology;  Laterality: Left;  . LUNG REMOVAL, PARTIAL    . MULTIPLE TOOTH EXTRACTIONS    . PARS PLANA VITRECTOMY Left 11/29/2015   WITH 25G REMOVAL/SUTURE INTRAOCULAR LENS LEFT EYE  . PARS PLANA VITRECTOMY Left 11/29/2015   Procedure: PARS PLANA VITRECTOMY WITH 25G REMOVAL/SUTURE INTRAOCULAR LENS LEFT EYE;  Surgeon: Hayden Pedro, MD;  Location: [place];  Service: Ophthalmology;  Laterality: Left;  .  TONSILLECTOMY       OB History   No obstetric history on file.      Home Medications    Prior to Admission medications   Medication Sig Start Date End Date Taking? Authorizing Provider  ALPHAGAN P 0.1 % SOLN Place 1 drop into both eyes 2 (two) times daily.  06/08/14   [provider]  amLODipine (NORVASC) 10 MG tablet Take 10 mg by mouth daily.      [provider]  aspirin EC 81 MG tablet Take 81 mg by mouth daily.    [provider]  brimonidine (ALPHAGAN) 0.2 % ophthalmic solution Place 1 drop into the left eye 2 (two) times daily. 11/30/15   Sherrie George, MD   cholecalciferol (VITAMIN D) 1000 units tablet Take 1,000 Units by mouth daily.    [provider]  erythromycin ophthalmic ointment Place into the left eye 3 (three) times daily. 11/30/15   Sherrie George, MD  gatifloxacin (ZYMAXID) 0.5 % SOLN Place 1 drop into the left eye 4 (four) times daily. 11/30/15   Sherrie George, MD  glipiZIDE (GLUCOTROL XL) 5 MG 24 hr tablet Take 5 mg by mouth daily. 11/07/15   [provider]  latanoprost (XALATAN) 0.005 % ophthalmic solution Place 1 drop into the left eye at bedtime. 11/30/15   Sherrie George, MD  losartan (COZAAR) 100 MG tablet Take 100 mg by mouth daily. 11/08/15   [provider]  metFORMIN (GLUCOPHAGE-XR) 500 MG 24 hr tablet Take 500 mg by mouth daily. 06/07/14   [provider]  Multiple Vitamin (MULTIVITAMIN) tablet Take 1 tablet by mouth daily.      [provider]  omeprazole (PRILOSEC) 20 MG capsule Take 20 mg by mouth 2 (two) times daily as needed (For heartburn or acid reflux.).  06/07/14   [provider]  prednisoLONE acetate (PRED FORTE) 1 % ophthalmic suspension Place 1 drop into the left eye 4 (four) times daily. 11/30/15   Sherrie George, MD  TRAVATAN Z 0.004 % SOLN ophthalmic solution Place 1 drop into both eyes at bedtime.  06/07/14   [provider]    Family History Family History  Problem Relation Age of Onset  . Hypertension Mother   . Stroke Mother   . Cancer Father     Social History Social History   Tobacco Use  . Smoking status: Never Smoker  . Smokeless tobacco: Never Used  Substance Use Topics  . Alcohol use: No  . Drug use: No     Allergies   Penicillins   Review of Systems Review of Systems  Neurological: Positive for weakness.  All other systems reviewed and are negative.    Physical Exam Updated Vital Signs Ht 5' (1.524 m)   Wt 56 kg   SpO2 97%   BMI 24.11 kg/m   Physical Exam Vitals signs and nursing note reviewed.   Constitutional:      General: She is not in acute distress.    Appearance: Normal appearance. She is well-developed and normal weight.  HENT:     Head: Normocephalic and atraumatic.     Mouth/Throat:     Mouth: Mucous membranes are moist.  Eyes:     Pupils: Pupils are equal, round, and reactive to light.  Neck:     Musculoskeletal: Normal range of motion and neck supple. No muscular tenderness.  Cardiovascular:     Rate and Rhythm: Normal rate and regular rhythm.  Heart sounds: Normal heart sounds. No murmur. No friction rub.  Pulmonary:     Effort: Pulmonary effort is normal.     Breath sounds: Normal breath sounds. No wheezing or rales.  Abdominal:     General: Bowel sounds are normal. There is no distension.     Palpations: Abdomen is soft.     Tenderness: There is no abdominal tenderness. There is no guarding or rebound.  Musculoskeletal: Normal range of motion.        General: Deformity present. No tenderness.     Comments: No edema.  Deformities in bilateral lower legs as a result of CP  Skin:    General: Skin is warm and dry.     Findings: No rash.  Neurological:     Mental Status: She is alert and oriented to person, place, and time.     Cranial Nerves: No cranial nerve deficit.     Comments: Poor coordination in bilateral upper extremities right greater than left.  4 out of 5 strength in right hand grip and 5 out of 5 strength in left hand grip.  3 out of 5 strength bilaterally in the lower extremities which is chronic.  Sensation is intact.  No notable facial droop, visual field cut.  Unable to complete finger-to-nose testing with the right hand despite significant effort.  With significant effort patient is able to grab the bed rails and eventually pull herself upright  Psychiatric:        Mood and Affect: Mood normal.        Behavior: Behavior normal.        Thought Content: Thought content normal.      ED Treatments / Results  Labs (all labs ordered are  listed, but only abnormal results are displayed) Labs Reviewed  CBC WITH DIFFERENTIAL/PLATELET - Abnormal; Notable for the following components:      Result Value   WBC 12.2 (*)    RBC 6.12 (*)    Hemoglobin 15.5 (*)    HCT 48.7 (*)    MCV 79.6 (*)    MCH 25.3 (*)    RDW 15.6 (*)    Neutro Abs 9.6 (*)    All other components within normal limits  COMPREHENSIVE METABOLIC PANEL - Abnormal; Notable for the following components:   Glucose, Bld 248 (*)    BUN 75 (*)    Creatinine, Ser 1.25 (*)    Albumin 3.2 (*)    AST 79 (*)    ALT 68 (*)    Total Bilirubin 1.6 (*)    GFR calc non Af Amer 39 (*)    GFR calc Af Amer 45 (*)    All other components within normal limits  CK - Abnormal; Notable for the following components:   Total CK 1,623 (*)    All other components within normal limits  CBG MONITORING, ED - Abnormal; Notable for the following components:   Glucose-Capillary 230 (*)    All other components within normal limits  URINALYSIS, ROUTINE W REFLEX MICROSCOPIC    EKG EKG Interpretation  Date/Time:  Sunday May 17 2019 11:18:07 EST Ventricular Rate:  76 PR Interval:    QRS Duration: 124 QT Interval:  424 QTC Calculation: 477 R Axis:   -89 Text Interpretation: Sinus rhythm Nonspecific IVCD with LAD Left ventricular hypertrophy Lateral leads are also involved Probable RV involvement, suggest recording right precordial leads No significant change since last tracing Confirmed by Gwyneth Sprout ([id number]) on 05/17/2019 11:43:56 AM  Radiology Ct Head Wo Contrast  Result Date: 05/17/2019 CLINICAL DATA:  History of cerebral palsy, lower extremity weakness EXAM: CT HEAD WITHOUT CONTRAST CT CERVICAL SPINE WITHOUT CONTRAST TECHNIQUE: Multidetector CT imaging of the head and cervical spine was performed following the standard protocol without intravenous contrast. Multiplanar CT image reconstructions of the cervical spine were also generated. COMPARISON:  06/22/2014 FINDINGS:  CT HEAD FINDINGS Brain: No evidence of acute infarction, hemorrhage, hydrocephalus, extra-axial collection or mass lesion/mass effect. Periventricular and deep white matter hypodensity with unchanged foci of encephalomalacia of the left parietal lobe. Vascular: No hyperdense vessel or unexpected calcification. Skull: Normal. Negative for fracture or focal lesion. Sinuses/Orbits: No acute finding. Other: None. CT CERVICAL SPINE FINDINGS Alignment: Normal. Skull base and vertebrae: No acute fracture. No primary bone lesion or focal pathologic process. Soft tissues and spinal canal: No prevertebral fluid or swelling. No visible canal hematoma. Disc levels: Severe multilevel disc degenerative disease and osteophytosis of the cervical spine. Upper chest: Bandlike scarring of the included bilateral lung apices with wedge resection line of the left lung apex. Other: None. IMPRESSION: 1. No acute intracranial pathology. Small-vessel white matter disease and unchanged encephalomalacia of the left parietal lobe. 2. No fracture or static subluxation of the cervical spine. Severe multilevel disc degenerative disease and osteophytosis. Electronically Signed   By: Lauralyn PrimesAlex  Bibbey M.D.   On: 05/17/2019 13:30   Ct Cervical Spine Wo Contrast  Result Date: 05/17/2019 CLINICAL DATA:  History of cerebral palsy, lower extremity weakness EXAM: CT HEAD WITHOUT CONTRAST CT CERVICAL SPINE WITHOUT CONTRAST TECHNIQUE: Multidetector CT imaging of the head and cervical spine was performed following the standard protocol without intravenous contrast. Multiplanar CT image reconstructions of the cervical spine were also generated. COMPARISON:  06/22/2014 FINDINGS: CT HEAD FINDINGS Brain: No evidence of acute infarction, hemorrhage, hydrocephalus, extra-axial collection or mass lesion/mass effect. Periventricular and deep white matter hypodensity with unchanged foci of encephalomalacia of the left parietal lobe. Vascular: No hyperdense vessel or  unexpected calcification. Skull: Normal. Negative for fracture or focal lesion. Sinuses/Orbits: No acute finding. Other: None. CT CERVICAL SPINE FINDINGS Alignment: Normal. Skull base and vertebrae: No acute fracture. No primary bone lesion or focal pathologic process. Soft tissues and spinal canal: No prevertebral fluid or swelling. No visible canal hematoma. Disc levels: Severe multilevel disc degenerative disease and osteophytosis of the cervical spine. Upper chest: Bandlike scarring of the included bilateral lung apices with wedge resection line of the left lung apex. Other: None. IMPRESSION: 1. No acute intracranial pathology. Small-vessel white matter disease and unchanged encephalomalacia of the left parietal lobe. 2. No fracture or static subluxation of the cervical spine. Severe multilevel disc degenerative disease and osteophytosis. Electronically Signed   By: Lauralyn PrimesAlex  Bibbey M.D.   On: 05/17/2019 13:30    Procedures Procedures (including critical care time)  Medications Ordered in ED Medications - No data to display   Initial Impression / Assessment and Plan / ED Course  I have reviewed the triage vital signs and the nursing notes.  Pertinent labs & imaging results that were available during my care of the patient were reviewed by me and considered in my medical decision making (see chart for details).        Elderly female with a history of cerebral palsy who is independent at home using wheelchair and crutches presenting today with a multiple day history of weakness.  Patient has had difficulties sitting upright and getting into a wheelchair which she states has never happened before.  She denies fever, cough, nausea, vomiting.  She has no pain.  However on exam patient does have a more notable weakness of the right upper extremity and some coordination difficulty.  Lower extremities are 3 out of 5 strength but seem to be chronic.  No recent medical notes are present but in 2015 when  patient was admitted she had normal strength in her upper extremities.  Concern for possible radiculopathy versus stroke.  Patient is otherwise awake and alert and intact with lower suspicion for delirium, elevated intracranial pressure, infection.  She has not recently been sick or given a history concerning for Guillain-Barr or transverse pyelitis.  Labs and imaging pending  2:13 PM Patient CBC with mild leukocytosis of 12,000, CMP with hyperglycemia, mild AKI with creatinine of 1.25 and mild elevated LFTs.  CK is elevated at 1600 and head CT shows unchanged encephalomalacia of the left parietal lobe, small vessel disease but no acute findings.  CT of the cervical spine shows no fracture or static subluxation but severe multilevel disc degeneration and osteophytes.  Given patient's significant upper extremity weakness will do MRI to further evaluate and patient given IV fluids.  Final Clinical Impressions(s) / ED Diagnoses   Final diagnoses:  None    ED Discharge Orders    None       Gwyneth Sprout, MD 05/17/19 1510

## 2019-05-17 NOTE — ED Notes (Addendum)
IV attempted on return from ct without success.

## 2019-05-17 NOTE — Progress Notes (Signed)
PT Cancellation Note  Patient Details Name: Julia [name] MRN: [id number] DOB: 1931/08/16   Cancelled Treatment:    Reason Eval/Treat Not Completed: Patient at procedure or test/unavailable (MRI).  Julia [name], PT, DPT Acute Rehabilitation Services Pager [phone] Office [phone]    Willy Eddy 05/17/2019, 4:17 PM

## 2019-05-17 NOTE — ED Notes (Signed)
Pt to MRI

## 2019-05-17 NOTE — Care Management (Addendum)
ED CM spoke with the patient at the bedside. Provided the patient with a Medicare [employer] [facility] list. Patient states she has a power wheelchair, crutches, bedside commode, Rolator and shower chair at home. She states she needs assistance with meals. Medical work-up is ongoing. CM/SW will assist with arranging home care at discharge.

## 2019-05-17 NOTE — ED Notes (Signed)
Pt given water and ginger ale per her request.

## 2019-05-17 NOTE — ED Notes (Signed)
ED TO INPATIENT HANDOFF REPORT  ED Nurse Name and Phone #: Magnus Ivan, RN [phone]  S Name/Age/Gender Julia [name] 85 y.o. female Room/Bed: 021C/021C  Code Status   Code Status: Prior  Home/SNF/Other Home Patient oriented to: self, place, time and situation Is this baseline? Yes   Triage Complete: Triage complete  Chief Complaint weakness  Triage Note Pt asrrives EMS from home with c/o weakness since Tuesday. Pt had episode while doing laundry had knees "give way". Pt able to crawl afterward . C/o continued weakness.  Hx of Cerebal palsy. Family states pt needs home care and pt asking   Allergies Allergies  Allergen Reactions  . Penicillins Other (See Comments)    "Made my feet sore" in childhood Has patient had a PCN reaction causing immediate rash, facial/tongue/throat swelling, SOB or lightheadedness with hypotension: no Has patient had a PCN reaction causing severe rash involving mucus membranes or skin necrosis: no Has patient had a PCN reaction that required hospitalization no Has patient had a PCN reaction occurring within the last 10 years: no If all of the above answers are "NO", then may proceed with Cephalosporin use.     Level of Care/Admitting Diagnosis ED Disposition    ED Disposition Condition Comment   Admit  Hospital Area: [facility] [[id number]]  Level of Care: Telemetry Medical [104]  I expect the patient will be discharged within 24 hours: Yes  LOW acuity---Tx typically complete <24 hrs---ACUTE conditions typically can be evaluated <24 hours---LABS likely to return to acceptable levels <24 hours---IS near functional baseline---EXPECTED to return to current living arrangement---NOT newly hypoxic: Does not meet criteria for 5C-Observation unit  Covid Evaluation: Asymptomatic Screening Protocol (No Symptoms)  Diagnosis: Rhabdomyolysis [728.88.ICD-9-CM]  Admitting Physician: Briscoe Deutscher [[id number]]  Attending Physician: Briscoe Deutscher  [[id number]]  PT Class (Do Not Modify): Observation [104]  PT Acc Code (Do Not Modify): Observation [10022]       B Medical/Surgery History Past Medical History:  Diagnosis Date  . Cerebral palsy (HCC)   . Dizziness and giddiness   . Essential hypertension, malignant   . GERD (gastroesophageal reflux disease)   . Hypokalemia   . Kidney stones   . Sarcoidosis   . Type II or unspecified type diabetes mellitus without mention of complication, not stated as uncontrolled   . Unspecified vitamin D deficiency    Past Surgical History:  Procedure Laterality Date  . ABDOMINAL HYSTERECTOMY    . cerebral palsy surgeries    . CESAREAN SECTION    . EYE SURGERY    . GAS/FLUID EXCHANGE Left 11/29/2015   Procedure: GAS/FLUID EXCHANGE;  Surgeon: Sherrie George, MD;  Location[facility] OR;  Service: Ophthalmology;  Laterality: Left;  . LASER PHOTO ABLATION Left 11/29/2015   Procedure: LASER PHOTO ABLATION LEFT EYE;  Surgeon: Sherrie George, MD;  Location: [facility] OR;  Service: Ophthalmology;  Laterality: Left;  . LUNG REMOVAL, PARTIAL    . MULTIPLE TOOTH EXTRACTIONS    . PARS PLANA VITRECTOMY Left 11/29/2015   WITH 25G REMOVAL/SUTURE INTRAOCULAR LENS LEFT EYE  . PARS PLANA VITRECTOMY Left 11/29/2015   Procedure: PARS PLANA VITRECTOMY WITH 25G REMOVAL/SUTURE INTRAOCULAR LENS LEFT EYE;  Surgeon: Sherrie George, MD;  Location: [facility] OR;  Service: Ophthalmology;  Laterality: Left;  . TONSILLECTOMY       A IV Location/Drains/Wounds Patient Lines/Drains/Airways Status   Active Line/Drains/Airways    Name:   Placement date:   Placement time:   Site:  Days:   Peripheral IV 05/17/19 Right Hand   05/17/19    1518    Hand   less than 1          Intake/Output Last 24 hours No intake or output data in the 24 hours ending 05/17/19 2025  Labs/Imaging Results for orders placed or performed during the hospital encounter of 05/17/19 (from the past 48 hour(s))  CBG monitoring, ED     Status: Abnormal   Collection  Time: 05/17/19 11:11 AM  Result Value Ref Range   Glucose-Capillary 230 (H) 70 - 99 mg/dL   Comment 1 Notify RN    Comment 2 Document in Chart   CBC with Differential     Status: Abnormal   Collection Time: 05/17/19 11:51 AM  Result Value Ref Range   WBC 12.2 (H) 4.0 - 10.5 K/uL   RBC 6.12 (H) 3.87 - 5.11 MIL/uL   Hemoglobin 15.5 (H) 12.0 - 15.0 g/dL   HCT 40.948.7 (H) 81.136.0 - 91.446.0 %   MCV 79.6 (L) 80.0 - 100.0 fL   MCH 25.3 (L) 26.0 - 34.0 pg   MCHC 31.8 30.0 - 36.0 g/dL   RDW 78.215.6 (H) 95.611.5 - 21.315.5 %   Platelets 242 150 - 400 K/uL   nRBC 0.0 0.0 - 0.2 %   Neutrophils Relative % 79 %   Neutro Abs 9.6 (H) 1.7 - 7.7 K/uL   Lymphocytes Relative 13 %   Lymphs Abs 1.6 0.7 - 4.0 K/uL   Monocytes Relative 8 %   Monocytes Absolute 1.0 0.1 - 1.0 K/uL   Eosinophils Relative 0 %   Eosinophils Absolute 0.0 0.0 - 0.5 K/uL   Basophils Relative 0 %   Basophils Absolute 0.0 0.0 - 0.1 K/uL   Immature Granulocytes 0 %   Abs Immature Granulocytes 0.05 0.00 - 0.07 K/uL    Comment: Performed at [facility], [address] [place], [place] [address]  Comprehensive metabolic panel     Status: Abnormal   Collection Time: 05/17/19 11:51 AM  Result Value Ref Range   Sodium 143 135 - 145 mmol/L   Potassium 4.3 3.5 - 5.1 mmol/L   Chloride 107 98 - 111 mmol/L   CO2 24 22 - 32 mmol/L   Glucose, Bld 248 (H) 70 - 99 mg/dL   BUN 75 (H) 8 - 23 mg/dL   Creatinine, Ser 8.461.25 (H) 0.44 - 1.00 mg/dL   Calcium 9.0 8.9 - 96.210.3 mg/dL   Total Protein 7.0 6.5 - 8.1 g/dL   Albumin 3.2 (L) 3.5 - 5.0 g/dL   AST 79 (H) 15 - 41 U/L   ALT 68 (H) 0 - 44 U/L   Alkaline Phosphatase 101 38 - 126 U/L   Total Bilirubin 1.6 (H) 0.3 - 1.2 mg/dL   GFR calc non Af Amer 39 (L) >60 mL/min   GFR calc Af Amer 45 (L) >60 mL/min   Anion gap 12 5 - 15    Comment: Performed at [facility], [address] [place], [place] [address]  CK     Status: Abnormal   Collection Time: 05/17/19 11:51 AM  Result Value Ref Range    Total CK 1,623 (H) 38 - 234 U/L    Comment: Performed at [facility], [address] [place], [place] [address]  Urinalysis, Routine w reflex microscopic     Status: Abnormal   Collection Time: 05/17/19  7:19 PM  Result Value Ref Range  Color, Urine YELLOW YELLOW   APPearance CLOUDY (A) CLEAR   Specific Gravity, Urine 1.012 1.005 - 1.030   pH 7.0 5.0 - 8.0   Glucose, UA >=500 (A) NEGATIVE mg/dL   Hgb urine dipstick MODERATE (A) NEGATIVE   Bilirubin Urine NEGATIVE NEGATIVE   Ketones, ur NEGATIVE NEGATIVE mg/dL   Protein, ur NEGATIVE NEGATIVE mg/dL   Nitrite NEGATIVE NEGATIVE   Leukocytes,Ua LARGE (A) NEGATIVE   RBC / HPF 6-10 0 - 5 RBC/hpf   WBC, UA >50 (H) 0 - 5 WBC/hpf   Bacteria, UA MANY (A) NONE SEEN   Squamous Epithelial / LPF 0-5 0 - 5   WBC Clumps PRESENT    Non Squamous Epithelial 0-5 (A) NONE SEEN    Comment: Performed at [facility] [address], [place], [place] [address]   Ct Head Wo Contrast  Result Date: 05/17/2019 CLINICAL DATA:  History of cerebral palsy, lower extremity weakness EXAM: CT HEAD WITHOUT CONTRAST CT CERVICAL SPINE WITHOUT CONTRAST TECHNIQUE: Multidetector CT imaging of the head and cervical spine was performed following the standard protocol without intravenous contrast. Multiplanar CT image reconstructions of the cervical spine were also generated. COMPARISON:  06/22/2014 FINDINGS: CT HEAD FINDINGS Brain: No evidence of acute infarction, hemorrhage, hydrocephalus, extra-axial collection or mass lesion/mass effect. Periventricular and deep white matter hypodensity with unchanged foci of encephalomalacia of the left parietal lobe. Vascular: No hyperdense vessel or unexpected calcification. Skull: Normal. Negative for fracture or focal lesion. Sinuses/Orbits: No acute finding. Other: None. CT CERVICAL SPINE FINDINGS Alignment: Normal. Skull base and vertebrae: No acute fracture. No primary bone lesion or focal pathologic process. Soft tissues  and spinal canal: No prevertebral fluid or swelling. No visible canal hematoma. Disc levels: Severe multilevel disc degenerative disease and osteophytosis of the cervical spine. Upper chest: Bandlike scarring of the included bilateral lung apices with wedge resection line of the left lung apex. Other: None. IMPRESSION: 1. No acute intracranial pathology. Small-vessel white matter disease and unchanged encephalomalacia of the left parietal lobe. 2. No fracture or static subluxation of the cervical spine. Severe multilevel disc degenerative disease and osteophytosis. Electronically Signed   By: Eddie Candle M.D.   On: 05/17/2019 13:30   Ct Cervical Spine Wo Contrast  Result Date: 05/17/2019 CLINICAL DATA:  History of cerebral palsy, lower extremity weakness EXAM: CT HEAD WITHOUT CONTRAST CT CERVICAL SPINE WITHOUT CONTRAST TECHNIQUE: Multidetector CT imaging of the head and cervical spine was performed following the standard protocol without intravenous contrast. Multiplanar CT image reconstructions of the cervical spine were also generated. COMPARISON:  06/22/2014 FINDINGS: CT HEAD FINDINGS Brain: No evidence of acute infarction, hemorrhage, hydrocephalus, extra-axial collection or mass lesion/mass effect. Periventricular and deep white matter hypodensity with unchanged foci of encephalomalacia of the left parietal lobe. Vascular: No hyperdense vessel or unexpected calcification. Skull: Normal. Negative for fracture or focal lesion. Sinuses/Orbits: No acute finding. Other: None. CT CERVICAL SPINE FINDINGS Alignment: Normal. Skull base and vertebrae: No acute fracture. No primary bone lesion or focal pathologic process. Soft tissues and spinal canal: No prevertebral fluid or swelling. No visible canal hematoma. Disc levels: Severe multilevel disc degenerative disease and osteophytosis of the cervical spine. Upper chest: Bandlike scarring of the included bilateral lung apices with wedge resection line of the left  lung apex. Other: None. IMPRESSION: 1. No acute intracranial pathology. Small-vessel white matter disease and unchanged encephalomalacia of the left parietal lobe. 2. No fracture or static subluxation of the cervical spine. Severe multilevel disc  degenerative disease and osteophytosis. Electronically Signed   By: Lauralyn Primes M.D.   On: 05/17/2019 13:30   Mr Brain Wo Contrast  Result Date: 05/17/2019 CLINICAL DATA:  85 year old female with lower extremity weakness. EXAM: MRI HEAD WITHOUT CONTRAST TECHNIQUE: Multiplanar, multiecho pulse sequences of the brain and surrounding structures were obtained without intravenous contrast. COMPARISON:  Head and cervical spine CT earlier today. Brain MRI 06/22/2014. FINDINGS: Brain: No restricted diffusion or evidence of acute infarction. Chronic disproportionate volume loss in the parietal lobes greater on the left. Suggestion of chronic cortical encephalomalacia in the left parietal lobe on series 12, image 17. Underlying lateral ventricle prominence (mostly colpocephaly) has not significantly changed since 2015. Superimposed confluent bilateral cerebral white matter T2 and FLAIR hyperintensity is chronic and involves the deep white matter capsules, mildly progressed since 2015. Superimposed bilateral basal ganglia and thalamic T2 heterogeneity has increased since 2015. Mild T2 heterogeneity in the pons has not significantly changed. No other cortical encephalomalacia identified. There are several chronic microhemorrhages in the brain, most notably the central brainstem on series 6, image 30. No midline shift, mass effect, evidence of mass lesion, ventriculomegaly, extra-axial collection or acute intracranial hemorrhage. Cervicomedullary junction and pituitary are within normal limits. Vascular: Major intracranial vascular flow voids are stable since 2015. Skull and upper cervical spine: Cervical spine reported separately today. Visualized bone marrow signal is within  normal limits. Sinuses/Orbits: Postoperative changes to both globes. Trace paranasal sinus mucosal thickening. Other: Mastoids are clear. Visible internal auditory structures appear normal. Scalp and face soft tissues appear negative. IMPRESSION: 1. No acute intracranial abnormality. 2. Chronic parietal lobe atrophy with suspected cortical encephalomalacia on the left. 3. Superimposed chronic but progressed since 2015 signal changes suggestive of chronic small vessel disease, including a small number of chronic micro-hemorrhages. Electronically Signed   By: Odessa Fleming M.D.   On: 05/17/2019 17:54   Mr Cervical Spine Wo Contrast  Result Date: 05/17/2019 CLINICAL DATA:  85 year old female with lower extremity weakness. EXAM: MRI CERVICAL SPINE WITHOUT CONTRAST TECHNIQUE: Multiplanar, multisequence MR imaging of the cervical spine was performed. No intravenous contrast was administered. COMPARISON:  Brain MRI reported separately today. Cervical spine CT earlier today. FINDINGS: Alignment: Straightening of cervical lordosis as seen earlier today. Vertebrae: No marrow edema or evidence of acute osseous abnormality. Chronic degenerative endplate marrow signal changes. Cord: Spinal cord signal is within normal limits at all visualized levels. Posterior Fossa, vertebral arteries, paraspinal tissues: Cervicomedullary junction is within normal limits. Brain findings today reported separately. Preserved major vascular flow voids in the neck. Negative visible neck soft tissues. Disc levels: C2-C3: Disc and posterior element degeneration. No spinal stenosis. Moderate to severe bilateral C3 foraminal stenosis. C3-C4: Disc space loss with circumferential disc osteophyte complex and moderate posterior element hypertrophy greater on the left. Mild spinal stenosis, mild if any cord mass effect. Severe bilateral C4 foraminal stenosis. C4-C5: Right eccentric circumferential disc osteophyte complex. Mild facet hypertrophy. No spinal  stenosis. Mild to moderate left and severe right C5 foraminal stenosis. C5-C6: Mostly far lateral and anterior circumferential disc osteophyte complex with mild facet hypertrophy. No spinal stenosis. Severe right greater than left C6 foraminal stenosis. C6-C7: Severe disc space loss with bulky circumferential disc osteophyte complex. Mild facet hypertrophy. Mild spinal stenosis, no cord mass effect. Moderate to severe left and severe right C7 foraminal stenosis. C7-T1: Circumferential disc bulge with endplate spurring. Mild posterior element hypertrophy. No spinal stenosis. Moderate to severe bilateral C8 foraminal stenosis. T1-T2 degeneration with foraminal but  no spinal stenosis. IMPRESSION: 1.  No acute osseous abnormality in the cervical spine. 2. Diffuse cervical spine degeneration. - mild spinal stenosis at C3-C4 and C6-C7 with up to mild spinal cord mass effect, but no cord signal abnormality. - diffuse moderate and severe bilateral cervical neural foraminal stenosis. Electronically Signed   By: Odessa Fleming M.D.   On: 05/17/2019 18:00    Pending Labs Unresulted Labs (From admission, onward)    Start     Ordered   05/18/19 0500  Hemoglobin A1c  Tomorrow morning,   R    Comments: To assess prior glycemic control    05/17/19 2013   05/17/19 2016  SARS CORONAVIRUS 2 (TAT 6-24 HRS) Nasopharyngeal Nasopharyngeal Swab  (Asymptomatic/Tier 2)  Once,   STAT    Question Answer Comment  Is this test for diagnosis or screening Screening   Symptomatic for COVID-19 as defined by CDC No   Hospitalized for COVID-19 No   Admitted to ICU for COVID-19 No   Previously tested for COVID-19 No   Resident in a congregate (group) care setting No   Employed in healthcare setting No   Pregnant No      05/17/19 2015   05/17/19 2009  Culture, Urine  Add-on,   AD     05/17/19 2009   05/17/19 2008  Sodium, urine, random  Add-on,   AD     05/17/19 2007   05/17/19 2008  Creatinine, urine, random  Add-on,   AD      05/17/19 2007   05/17/19 2008  Urinalysis, Complete w Microscopic  Add-on,   AD     05/17/19 2007   Signed and Held  Comprehensive metabolic panel  Tomorrow morning,   R     Signed and Held   Signed and Held  CBC  Tomorrow morning,   R     Signed and Held   Signed and Held  CK  Tomorrow morning,   R     Signed and Held          Vitals/Pain Today's Vitals   05/17/19 1815 05/17/19 1830 05/17/19 1845 05/17/19 1918  BP: (!) 109/91 (!) 117/100 (!) 144/126 (!) 132/97  Pulse: 89 77 92 83  Resp: 19 17 (!) 21   Temp:      TempSrc:      SpO2: 96% 96% 95% 96%  Weight:      Height:      PainSc: 0-No pain       Isolation Precautions No active isolations  Medications Medications  0.9 %  sodium chloride infusion (has no administration in time range)  insulin aspart (novoLOG) injection 0-9 Units (has no administration in time range)  insulin aspart (novoLOG) injection 0-5 Units (has no administration in time range)  sodium chloride 0.9 % bolus 1,000 mL (1,000 mLs Intravenous New Bag/Given 05/17/19 1545)    Mobility walks Moderate fall risk   Focused Assessments Neuro Assessment Handoff:  Swallow screen pass? Yes  Cardiac Rhythm: Normal sinus rhythm       Neuro Assessment: Exceptions to WDL Neuro Checks:      Last Documented NIHSS Modified Score:   Has TPA been given? No If patient is a Neuro Trauma and patient is going to OR before floor call report to 4N Charge nurse: [phone] or [phone]     R Recommendations: See Admitting Provider Note  Report given to:   Additional Notes:

## 2019-05-17 NOTE — ED Notes (Signed)
IV attempted without success. Labs drawn.  

## 2019-05-17 NOTE — Progress Notes (Signed)
CSW spoke with patient regarding meals on wheels and other food resources. CSW noted per patient report she had food she just is not able to cook her food independently right now. CSW spoke with RN afterwards and discussed environmental and safety concerns with patient's living situation. CSW noted possible need for SNF referral. CSW will continue to follow for discharge supports.

## 2019-05-18 DIAGNOSIS — R531 Weakness: Secondary | ICD-10-CM | POA: Diagnosis present

## 2019-05-18 DIAGNOSIS — I10 Essential (primary) hypertension: Secondary | ICD-10-CM | POA: Diagnosis present

## 2019-05-18 DIAGNOSIS — K219 Gastro-esophageal reflux disease without esophagitis: Secondary | ICD-10-CM | POA: Diagnosis present

## 2019-05-18 DIAGNOSIS — Z8249 Family history of ischemic heart disease and other diseases of the circulatory system: Secondary | ICD-10-CM | POA: Diagnosis not present

## 2019-05-18 DIAGNOSIS — Z20828 Contact with and (suspected) exposure to other viral communicable diseases: Secondary | ICD-10-CM | POA: Diagnosis present

## 2019-05-18 DIAGNOSIS — N390 Urinary tract infection, site not specified: Secondary | ICD-10-CM | POA: Diagnosis present

## 2019-05-18 DIAGNOSIS — M6282 Rhabdomyolysis: Secondary | ICD-10-CM | POA: Diagnosis present

## 2019-05-18 DIAGNOSIS — E559 Vitamin D deficiency, unspecified: Secondary | ICD-10-CM | POA: Diagnosis present

## 2019-05-18 DIAGNOSIS — Z7982 Long term (current) use of aspirin: Secondary | ICD-10-CM | POA: Diagnosis not present

## 2019-05-18 DIAGNOSIS — Z880 Allergy status to penicillin: Secondary | ICD-10-CM | POA: Diagnosis not present

## 2019-05-18 DIAGNOSIS — R1314 Dysphagia, pharyngoesophageal phase: Secondary | ICD-10-CM | POA: Diagnosis present

## 2019-05-18 DIAGNOSIS — E860 Dehydration: Secondary | ICD-10-CM | POA: Diagnosis present

## 2019-05-18 DIAGNOSIS — M5030 Other cervical disc degeneration, unspecified cervical region: Secondary | ICD-10-CM | POA: Diagnosis present

## 2019-05-18 DIAGNOSIS — Z7984 Long term (current) use of oral hypoglycemic drugs: Secondary | ICD-10-CM | POA: Diagnosis not present

## 2019-05-18 DIAGNOSIS — E1165 Type 2 diabetes mellitus with hyperglycemia: Secondary | ICD-10-CM | POA: Diagnosis present

## 2019-05-18 DIAGNOSIS — E1151 Type 2 diabetes mellitus with diabetic peripheral angiopathy without gangrene: Secondary | ICD-10-CM | POA: Diagnosis not present

## 2019-05-18 DIAGNOSIS — M2578 Osteophyte, vertebrae: Secondary | ICD-10-CM | POA: Diagnosis present

## 2019-05-18 DIAGNOSIS — Z8673 Personal history of transient ischemic attack (TIA), and cerebral infarction without residual deficits: Secondary | ICD-10-CM | POA: Diagnosis not present

## 2019-05-18 DIAGNOSIS — D869 Sarcoidosis, unspecified: Secondary | ICD-10-CM | POA: Diagnosis present

## 2019-05-18 DIAGNOSIS — Z90710 Acquired absence of both cervix and uterus: Secondary | ICD-10-CM | POA: Diagnosis not present

## 2019-05-18 DIAGNOSIS — Z87442 Personal history of urinary calculi: Secondary | ICD-10-CM | POA: Diagnosis not present

## 2019-05-18 DIAGNOSIS — G809 Cerebral palsy, unspecified: Secondary | ICD-10-CM | POA: Diagnosis present

## 2019-05-18 DIAGNOSIS — R7401 Elevation of levels of liver transaminase levels: Secondary | ICD-10-CM | POA: Diagnosis present

## 2019-05-18 DIAGNOSIS — N179 Acute kidney failure, unspecified: Secondary | ICD-10-CM | POA: Diagnosis present

## 2019-05-18 DIAGNOSIS — Z993 Dependence on wheelchair: Secondary | ICD-10-CM | POA: Diagnosis not present

## 2019-05-18 LAB — COMPREHENSIVE METABOLIC PANEL
ALT: 50 U/L — ABNORMAL HIGH (ref 0–44)
AST: 45 U/L — ABNORMAL HIGH (ref 15–41)
Albumin: 2.4 g/dL — ABNORMAL LOW (ref 3.5–5.0)
Alkaline Phosphatase: 75 U/L (ref 38–126)
Anion gap: 8 (ref 5–15)
BUN: 36 mg/dL — ABNORMAL HIGH (ref 8–23)
CO2: 23 mmol/L (ref 22–32)
Calcium: 7.7 mg/dL — ABNORMAL LOW (ref 8.9–10.3)
Chloride: 111 mmol/L (ref 98–111)
Creatinine, Ser: 0.74 mg/dL (ref 0.44–1.00)
GFR calc Af Amer: >60 mL/min (ref >60)
GFR calc non Af Amer: >60 mL/min (ref >60)
Glucose, Bld: 231 mg/dL — ABNORMAL HIGH (ref 70–99)
Potassium: 2.9 mmol/L — ABNORMAL LOW (ref 3.5–5.1)
Sodium: 142 mmol/L (ref 135–145)
Total Bilirubin: 0.9 mg/dL (ref 0.3–1.2)
Total Protein: 5.3 g/dL — ABNORMAL LOW (ref 6.5–8.1)

## 2019-05-18 LAB — SARS CORONAVIRUS 2 (TAT 6-24 HRS): SARS Coronavirus 2: NEGATIVE

## 2019-05-18 LAB — CBC
HCT: 39.1 % (ref 36.0–46.0)
Hemoglobin: 12.6 g/dL (ref 12.0–15.0)
MCH: 25.4 pg — ABNORMAL LOW (ref 26.0–34.0)
MCHC: 32.2 g/dL (ref 30.0–36.0)
MCV: 78.8 fL — ABNORMAL LOW (ref 80.0–100.0)
Platelets: 178 10*3/uL (ref 150–400)
RBC: 4.96 MIL/uL (ref 3.87–5.11)
RDW: 15.2 % (ref 11.5–15.5)
WBC: 9.3 10*3/uL (ref 4.0–10.5)
nRBC: 0 % (ref 0.0–0.2)

## 2019-05-18 LAB — GLUCOSE, CAPILLARY
Glucose-Capillary: 216 mg/dL — ABNORMAL HIGH (ref 70–99)
Glucose-Capillary: 149 mg/dL — ABNORMAL HIGH (ref 70–99)
Glucose-Capillary: 180 mg/dL — ABNORMAL HIGH (ref 70–99)
Glucose-Capillary: 178 mg/dL — ABNORMAL HIGH (ref 70–99)

## 2019-05-18 LAB — HEMOGLOBIN A1C
Hgb A1c MFr Bld: 7.2 % — ABNORMAL HIGH (ref 4.8–5.6)
Mean Plasma Glucose: 159.94 mg/dL

## 2019-05-18 LAB — CK: Total CK: 854 U/L — ABNORMAL HIGH (ref 38–234)

## 2019-05-18 LAB — MAGNESIUM: Magnesium: 2 mg/dL (ref 1.7–2.4)

## 2019-05-18 MED ORDER — POTASSIUM CHLORIDE CRYS ER 20 MEQ PO TBCR
40.0000 meq | EXTENDED_RELEASE_TABLET | Freq: Two times a day (BID) | ORAL | Status: AC
  Administered 2019-05-18 (×2): 40 meq via ORAL
  Filled 2019-05-18 (×2): qty 2

## 2019-05-18 MED ORDER — SODIUM CHLORIDE 0.9 % IV SOLN
INTRAVENOUS | Status: AC
  Administered 2019-05-18: 12:00:00 via INTRAVENOUS

## 2019-05-18 NOTE — TOC Initial Note (Signed)
Transition of Care [facility]) - Initial/Assessment Note    Patient Details  Name: Julia [name] MRN: [id number] Date of Birth: 09/17/1931  [facility]) CM/SW Contact:    Pollie Friar, RN Phone Number: 05/18/2019, 1:37 PM  Clinical Narrative:                 Recommendations are for SNF. CM met with the patient and she is willing to d/c to SNF rehab in order to get her strength back and do her own tasks at home. Pt was living home alone and doesn't have family that can provide the assistance she needs.  Pt agreeable to being faxed out in the [facility] area.  TOC following for d/c disposition.  Expected Discharge Plan: Skilled Nursing Facility Barriers to Discharge: Continued Medical Work up   Patient Goals and CMS Choice Patient states their goals for this hospitalization and ongoing recovery are:: To get back to doing things for herself like she was before CMS Medicare.gov Compare Post Acute Care list provided to:: Patient Choice offered to / list presented to : Patient  Expected Discharge Plan and Services Expected Discharge Plan: [place] In-house Referral: Clinical Social Work Discharge Planning Services: CM Consult Post Acute Care Choice: [place] Living arrangements for the past 2 months: [place]                                      Prior Living Arrangements/Services Living arrangements for the past 2 months: Single Family Home Lives with:: Self Patient language and need for interpreter reviewed:: Yes Do you feel safe going back to the place where you live?: Yes      Need for Family Participation in Patient Care: Yes (Comment)(pt needs 24 hour supervision/ care) Care giver support system in place?: No (comment) Current home services: DME Criminal Activity/Legal Involvement Pertinent to Current Situation/Hospitalization: No - Comment as needed  Activities of Daily Living Home Assistive  Devices/Equipment: Bedside commode/3-in-1, Built-in shower seat, CBG Meter, Crutches, Eyeglasses, Grab bars around toilet, Grab bars in shower, Hand-held shower hose, Shower chair with back, Wheelchair ADL Screening (condition at time of admission) Patient's cognitive ability adequate to safely complete daily activities?: Yes Is the patient deaf or have difficulty hearing?: No Does the patient have difficulty seeing, even when wearing glasses/contacts?: No Does the patient have difficulty concentrating, remembering, or making decisions?: No Patient able to express need for assistance with ADLs?: Yes Does the patient have difficulty dressing or bathing?: Yes Independently performs ADLs?: No Communication: Independent Dressing (OT): Independent Grooming: Independent Feeding: Independent(assistance with opening packages) Bathing: Independent Toileting: Independent In/Out Bed: Needs assistance Is this a change from baseline?: Pre-admission baseline Walks in Home: Needs assistance Is this a change from baseline?: Pre-admission baseline Does the patient have difficulty walking or climbing stairs?: Yes Weakness of Legs: Both Weakness of Arms/Hands: Both  Permission Sought/Granted                  Emotional Assessment Appearance:: Appears stated age Attitude/Demeanor/Rapport: Engaged Affect (typically observed): Accepting, Pleasant Orientation: : Oriented to Self, Oriented to Place, Oriented to  Time, Oriented to Situation   Psych Involvement: No (comment)  Admission diagnosis:  weakness Patient Active Problem List   Diagnosis Date Noted  . UTI (urinary tract infection) 05/18/2019  . Rhabdomyolysis 05/17/2019  . Cerebral palsy ([place])   . Renal insufficiency   .  Elevated transaminase level   . Retained lens material following cataract surgery, left eye 11/29/2015  . Aphakia, left eye 11/29/2015  . Retained lens material following cataract surgery of left eye 11/29/2015  .  Facial numbness 06/22/2014  . Stroke, embolic ([place]) 16/07/930  . TIA (transient ischemic attack) 06/22/2014  . HTN (hypertension) 06/22/2014  . DM type 2 without retinopathy ([place]) 06/22/2014  . Left jaw numbness 06/22/2014  . Cystic fibrosis ([place]) 06/22/2014  . Stroke [facility])    PCP:  Rogers Blocker, MD Pharmacy:   [facility] [place], [address] [place] [address] Phone: [phone] Fax: [phone]     Social Determinants of Health (SDOH) Interventions    Readmission Risk Interventions No flowsheet data found.

## 2019-05-18 NOTE — Evaluation (Signed)
Physical Therapy Evaluation Patient Details Name: Julia [name] MRN: [id number] DOB: 01-29-1932 Today's Date: 05/18/2019   History of Present Illness  Julia [name] is a 85 y.o. female with medical history significant for cerebral palsy, hypertension, and type 2 diabetes mellitus, now presenting to emergency department for evaluation of generalized weakness.    Clinical Impression  Pt has been living alone and indep despite having CP since 1981. Pt now presenting with worsening weakness requiring mod/maxA for bed mobility and std pvt transfer to recliner. Pt was able to transfer self to/from power chair, amb short distances with crutches. Pt was cooking, cleaning and doing grocery shopping. Pt unable to do thes IADLs and ADLs without assist at this time. Discussed at length going to [facility]NF to gain strength back vs "I know it'll be expensive but I was thinking of hiring someone morning, day, and night." Pt stated "I would go to rehab for a month." Educated that at SNF she would get therapy 5-6 days a weak vs at home only 2-3/wk. Pt agreeable to ST-sNF. Acute PT to cont to follow.    Follow Up Recommendations SNF;Supervision/Assistance - 24 hour    Equipment Recommendations  None recommended by PT(has all DME)    Recommendations for Other Services       Precautions / Restrictions Precautions Precautions: Fall Precaution Comments: h/o cp Restrictions Weight Bearing Restrictions: No      Mobility  Bed Mobility Overal bed mobility: Needs Assistance Bed Mobility: Rolling;Sidelying to Sit Rolling: Min assist Sidelying to sit: Max assist       General bed mobility comments: pt used R bed rail to initiated rolling, used UEs to assist LEs, pt required maxA for trunk elevation up to EOB due to weakness  Transfers Overall transfer level: Needs assistance Equipment used: (1 person dependent lift) Transfers: Sit to/from [employer] Sit to Stand: Max assist Stand pivot  transfers: Max assist       General transfer comment: pt initiated transfer however unable to physically get into standing and step without maxA, unable to achieve full upright standing.  unable to achieve bilat knee extension  Ambulation/Gait             General Gait Details: unable this date  Stairs            Wheelchair Mobility    Modified Rankin (Stroke Patients Only)       Balance Overall balance assessment: Needs assistance Sitting-balance support: Feet supported;Bilateral upper extremity supported Sitting balance-Leahy Scale: Poor Sitting balance - Comments: pt with L lateral lean stating "I can't ever sit up straight, I just find a balance point and work with it" Postural control: Left lateral lean Standing balance support: Bilateral upper extremity supported Standing balance-Leahy Scale: Zero Standing balance comment: dependent on physical assist                             Pertinent Vitals/Pain Pain Assessment: No/denies pain    Home Living Family/patient expects to be discharged to:: Private residence Living Arrangements: Alone Available Help at Discharge: Family;Friend(s);Available 24 hours/day(sons lives in [place] and [place]) Type of Home: House Home Access: Ramped entrance     Home Layout: One level Home Equipment: Environmental consultant - 2 wheels;Walker - 4 wheels;Cane - single point;Crutches;Bedside commode;Shower seat;Grab bars - toilet;Grab bars - tub/shower;Wheelchair - power      Prior Function Level of Independence: Independent with assistive device(s)  Comments: was driving until a month ago, pt uses power w/c predominately but can walk short distances with crutches, orders in groceries     Hand Dominance   Dominant Hand: Right    Extremity/Trunk Assessment   Upper Extremity Assessment Upper Extremity Assessment: Generalized weakness;RUE deficits/detail;LUE deficits/detail RUE Deficits / Details: pt with mild  flexion contractures, unable to achieve full active extension, initiated movement but limited ability to pull self into sidelying or push self up to EOB LUE Deficits / Details: pt wiht limited active extension due to tone from CP, initiates movement but decreased strength to use functionally during transfers    Lower Extremity Assessment Lower Extremity Assessment: Generalized weakness(grossly 3-/5, unable to stand up and maintain weight bearing)    Cervical / Trunk Assessment Cervical / Trunk Assessment: (scoliosis to the L)  Communication   Communication: No difficulties  Cognition Arousal/Alertness: Awake/alert Behavior During Therapy: WFL for tasks assessed/performed Overall Cognitive Status: Within Functional Limits for tasks assessed                                 General Comments: very determined to return home alone and live to 85yo      General Comments General comments (skin integrity, edema, etc.): VSS    Exercises     Assessment/Plan    PT Assessment Patient needs continued PT services  PT Problem List Decreased strength;Decreased range of motion;Decreased activity tolerance;Decreased mobility;Decreased balance;Decreased coordination;Decreased cognition;Decreased knowledge of use of DME       PT Treatment Interventions DME instruction;Gait training;Stair training;Functional mobility training;Therapeutic activities;Therapeutic exercise;Balance training;Neuromuscular re-education    PT Goals (Current goals can be found in the Care Plan section)  Acute Rehab PT Goals Patient Stated Goal: home alone PT Goal Formulation: With patient Time For Goal Achievement: 06/01/19 Potential to Achieve Goals: Good    Frequency Min 3X/week   Barriers to discharge Decreased caregiver support lives alone    Co-evaluation               AM-PAC PT "6 Clicks" Mobility  Outcome Measure Help needed turning from your back to your side while in a flat bed  without using bedrails?: A Little Help needed moving from lying on your back to sitting on the side of a flat bed without using bedrails?: A Little Help needed moving to and from a bed to a chair (including a wheelchair)?: Total Help needed standing up from a chair using your arms (e.g., wheelchair or bedside chair)?: Total Help needed to walk in hospital room?: Total Help needed climbing 3-5 steps with a railing? : Total 6 Click Score: 10    End of Session Equipment Utilized During Treatment: Gait belt Activity Tolerance: Patient tolerated treatment well Patient left: in chair;with call bell/phone within reach;with chair alarm set Nurse Communication: Mobility status PT Visit Diagnosis: Unsteadiness on feet (R26.81);Difficulty in walking, not elsewhere classified (R26.2)    Time: 7782-4235 PT Time Calculation (min) (ACUTE ONLY): 45 min   Charges:   PT Evaluation $PT Eval Moderate Complexity: 1 Mod PT Treatments $Therapeutic Activity: 23-37 mins        Kittie Plater, PT, DPT Acute Rehabilitation Services Pager #: [phone] Office #: [phone]   Berline Lopes 05/18/2019, 10:45 AM

## 2019-05-18 NOTE — [place] FL2 (Signed)
[place] MEDICAID FL2 LEVEL OF CARE SCREENING TOOL     IDENTIFICATION  Patient Name: Julia [name] Birthdate: 1932-05-18 Sex: female Admission Date (Current Location): 05/17/2019  [facility] and [place] Number:  Producer, television/film/video and Address:  [facility] [place], [address], [place], [place] [address]      Provider Number: [id number]  Attending Physician Name and Address:  Joseph Art, DO  Relative Name and Phone Number:       Current Level of Care: Hospital Recommended Level of Care: Skilled Nursing Facility Prior Approval Number:    Date Approved/Denied:   PASRR Number: [id number]  Discharge Plan: SNF    Current Diagnoses: Patient Active Problem List   Diagnosis Date Noted  . UTI (urinary tract infection) 05/18/2019  . Rhabdomyolysis 05/17/2019  . Cerebral palsy (HCC)   . Renal insufficiency   . Elevated transaminase level   . Retained lens material following cataract surgery, left eye 11/29/2015  . Aphakia, left eye 11/29/2015  . Retained lens material following cataract surgery of left eye 11/29/2015  . Facial numbness 06/22/2014  . Stroke, embolic (HCC) 06/22/2014  . TIA (transient ischemic attack) 06/22/2014  . HTN (hypertension) 06/22/2014  . DM type 2 without retinopathy (HCC) 06/22/2014  . Left jaw numbness 06/22/2014  . Cystic fibrosis (HCC) 06/22/2014  . Stroke (HCC)     Orientation RESPIRATION BLADDER Height & Weight     Self, Time, Situation, Place  Normal Incontinent Weight: 49.6 kg Height:  5' (152.4 cm)  BEHAVIORAL SYMPTOMS/MOOD NEUROLOGICAL BOWEL NUTRITION STATUS      Continent Diet(Heart healthy/ carb modified)  AMBULATORY STATUS COMMUNICATION OF NEEDS Skin   Extensive Assist Verbally Skin abrasions(to bilateral knees and rt elbow)                       Personal Care Assistance Level of Assistance  Bathing, Feeding, Dressing Bathing Assistance: Limited assistance Feeding assistance:  Independent Dressing Assistance: Limited assistance     Functional Limitations Info  Sight, Speech, Hearing Sight Info: Adequate Hearing Info: Adequate Speech Info: Adequate    SPECIAL CARE FACTORS FREQUENCY  PT (By licensed PT), OT (By licensed OT)     PT Frequency: 5x/wk OT Frequency: 5x/wk            Contractures Contractures Info: Not present    Additional Factors Info  Code Status, Allergies, Insulin Sliding Scale Code Status Info: Full Allergies Info: Penicillin   Insulin Sliding Scale Info: Novolog 0-9 units SQ three times a day/ Novolog 0-5 units SQ at bedtime       Current Medications (05/18/2019):  This is the current hospital active medication list Current Facility-Administered Medications  Medication Dose Route Frequency Provider Last Rate Last Dose  . 0.9 %  sodium chloride infusion   Intravenous Continuous Gwenyth Bender, NP 75 mL/hr at 05/18/19 1216    . acetaminophen (TYLENOL) tablet 650 mg  650 mg Oral Q6H PRN Opyd, Lavone Neri, MD       Or  . acetaminophen (TYLENOL) suppository 650 mg  650 mg Rectal Q6H PRN Opyd, Lavone Neri, MD      . amLODipine (NORVASC) tablet 10 mg  10 mg Oral Daily Opyd, Lavone Neri, MD   10 mg at 05/18/19 0937  . brimonidine (ALPHAGAN) 0.15 % ophthalmic solution 1 drop  1 drop Both Eyes BID Opyd, Lavone Neri, MD   1 drop at 05/18/19 0937  . dorzolamide-timolol (COSOPT) 22.3-6.8 MG/ML  ophthalmic solution 1 drop  1 drop Both Eyes BID Opyd, Ilene Qua, MD   1 drop at 05/18/19 0937  . gatifloxacin (ZYMAXID) 0.5 % ophthalmic drops 1 drop  1 drop Left Eye QID Opyd, Ilene Qua, MD   1 drop at 05/18/19 0938  . heparin injection 5,000 Units  5,000 Units Subcutaneous Q8H Vianne Bulls, MD   5,000 Units at 05/18/19 [phone]  . HYDROcodone-acetaminophen (NORCO/VICODIN) 5-325 MG per tablet 1 tablet  1 tablet Oral Q6H PRN Opyd, Ilene Qua, MD      . insulin aspart (novoLOG) injection 0-5 Units  0-5 Units Subcutaneous QHS Opyd, Timothy S, MD      . insulin  aspart (novoLOG) injection 0-9 Units  0-9 Units Subcutaneous TID WC Opyd, Ilene Qua, MD   1 Units at 05/18/19 1214  . latanoprost (XALATAN) 0.005 % ophthalmic solution 1 drop  1 drop Both Eyes QHS Opyd, Ilene Qua, MD   1 drop at 05/17/19 2309  . ondansetron (ZOFRAN) tablet 4 mg  4 mg Oral Q6H PRN Opyd, Ilene Qua, MD       Or  . ondansetron (ZOFRAN) injection 4 mg  4 mg Intravenous Q6H PRN Opyd, Ilene Qua, MD      . polyethylene glycol (MIRALAX / GLYCOLAX) packet 17 g  17 g Oral Daily PRN Opyd, Ilene Qua, MD      . sodium chloride flush (NS) 0.9 % injection 3 mL  3 mL Intravenous Q12H Opyd, Ilene Qua, MD   3 mL at 05/18/19 1000     Discharge Medications: Please see discharge summary for a list of discharge medications.  Relevant Imaging Results:  Relevant Lab Results:   Additional Information SS#: [id number]  Pollie Friar, RN

## 2019-05-18 NOTE — Progress Notes (Signed)
Pt potassium 2.9. Paged MD, awaiting orders.

## 2019-05-18 NOTE — TOC Progression Note (Signed)
Transition of Care [facility]) - Progression Note    Patient Details  Name: Julia [name] MRN: [id number] Date of Birth: January 07, 1932  Transition of [facility] CM/SW Contact  Pollie Friar, RN Phone Number: 05/18/2019, 3:36 PM  Clinical Narrative:    CM provided her choice for SNF. She selected [facility]. Irine Seal with [facility] notified. If patient ready in next day no new covid needed.  TOC following.   Expected Discharge Plan: [place] Barriers to Discharge: Continued Medical Work up  Expected Discharge Plan and Services Expected Discharge Plan: [place] In-house Referral: Clinical Social Work Discharge Planning Services: CM Consult Post Acute Care Choice: [place] arrangements for the past 2 months: Single Family Home                                       Social Determinants of Health (SDOH) Interventions    Readmission Risk Interventions No flowsheet data found.

## 2019-05-18 NOTE — Progress Notes (Signed)
[facility] PROGRESS NOTE  Julia [name] ZOX:[id number] DOB: 1931-08-15 DOA: 05/17/2019 PCP: Gwenyth Bender, MD  Assessment/Plan: 1. Rhabdomyolysis  - Presents with increased weakness, no longer able to use her crutches or transfer between chair and bed after a fall where she lay on ground for 2 hours before crawling to phone to call for help.  CK of 1623 with renal insufficiency. Both improving this am after IV fluids.  -continue IV fluids at slower rate -CK in am -monitor urine output   2. Renal insufficiency  BUN is 75 and SCr is 1.25 on admission, up from 12 and 0.57 in 2017. Urinalysis ? UTI. Improved this am. no fever, leukocytosis, dysuria.  - Holding nephrotoxins   -IV fluids as noted above -monitor urine output -follow urine culture -anti-biotics as indicated  3. Generalized weakness; cerebral palsy   Pt has cerebral palsy and had been ambulating with crutches some and able to transfer between wheelchair and bed until becoming increasingly weak over the past several days. There is no acute finding on MRI brain and c-spine. Increased weakness likely secondary to #1  - Continue IVF hydration - PT eval and tx,   4. Hypertension controlled - Hold losartan given increased creatinine  - Continue Norvasc as tolerated   5. Type II DM  serum glucose 248 on admission. HgA1c 7.2. Home meds include  metformin and glipizide, held on admission  - Check CBG's and use a low-intensity SSI with Novlog for now    6. Elevated transaminases trending down this am. Total bili 0.9 this am.  -OP follow up    Code Status: full Family Communication: patient Disposition Plan: home   Consultants:    Procedures:    Antibiotics:    HPI/Subjective: Sitting in chair reports feeling "fine" but unable to go home today as still "cant walk"  Objective: Vitals:   05/18/19 0748 05/18/19 1149  BP: (!) 148/60 138/60  Pulse: 67 66  Resp:  20  Temp: 98.4 F (36.9 C) (!) 97.3 F  (36.3 C)  SpO2: 98% 98%    Intake/Output Summary (Last 24 hours) at 05/18/2019 1239 Last data filed at 05/18/2019 0346 Gross per 24 hour  Intake 734.06 ml  Output -  Net 734.06 ml   Filed Weights   05/17/19 1118 05/17/19 2100  Weight: 56 kg 49.6 kg    Exam:   General:  Awake alert sitting in chair watching tv  Cardiovascular: rrr no mgr no LE edeam  Respiratory: no increased work of breathing. Faint end expiratory wheeze  Abdomen: soft +BS no guarding or rebounding  Musculoskeletal: joints without swelling/erythema   Data Reviewed: Basic Metabolic Panel: Recent Labs  Lab 05/17/19 1151 05/18/19 0351  NA 143 142  K 4.3 2.9*  CL 107 111  CO2 24 23  GLUCOSE 248* 231*  BUN 75* 36*  CREATININE 1.25* 0.74  CALCIUM 9.0 7.7*  MG  --  2.0   Liver Function Tests: Recent Labs  Lab 05/17/19 1151 05/18/19 0351  AST 79* 45*  ALT 68* 50*  ALKPHOS 101 75  BILITOT 1.6* 0.9  PROT 7.0 5.3*  ALBUMIN 3.2* 2.4*   No results for input(s): LIPASE, AMYLASE in the last 168 hours. No results for input(s): AMMONIA in the last 168 hours. CBC: Recent Labs  Lab 05/17/19 1151 05/18/19 0351  WBC 12.2* 9.3  NEUTROABS 9.6*  --   HGB 15.5* 12.6  HCT 48.7* 39.1  MCV 79.6* 78.8*  PLT 242 178  Cardiac Enzymes: Recent Labs  Lab 05/17/19 1151 05/18/19 0351  CKTOTAL 1,623* 854*   BNP (last 3 results) No results for input(s): BNP in the last 8760 hours.  ProBNP (last 3 results) No results for input(s): PROBNP in the last 8760 hours.  CBG: Recent Labs  Lab 05/17/19 1111 05/17/19 2111 05/18/19 0624 05/18/19 1151  GLUCAP 230* 172* 216* 149*    Recent Results (from the past 240 hour(s))  SARS CORONAVIRUS 2 (TAT 6-24 HRS) Nasopharyngeal Nasopharyngeal Swab     Status: None   Collection Time: 05/17/19  8:32 PM   Specimen: Nasopharyngeal Swab  Result Value Ref Range Status   SARS Coronavirus 2 NEGATIVE NEGATIVE Final    Comment: (NOTE) SARS-CoV-2 target nucleic acids  are NOT DETECTED. The SARS-CoV-2 RNA is generally detectable in upper and lower respiratory specimens during the acute phase of infection. Negative results do not preclude SARS-CoV-2 infection, do not rule out co-infections with other pathogens, and should not be used as the sole basis for treatment or other patient management decisions. Negative results must be combined with clinical observations, patient history, and epidemiological information. The expected result is Negative. Fact Sheet for Patients: HairSlick.nohttps://www.fda.gov/media/138098/download Fact Sheet for Healthcare Providers: quierodirigir.comhttps://www.fda.gov/media/138095/download This test is not yet approved or cleared by the [place] FDA and  has been authorized for detection and/or diagnosis of SARS-CoV-2 by FDA under an Emergency Use Authorization (EUA). This EUA will remain  in effect (meaning this test can be used) for the duration of the COVID-19 declaration under Section 56 4(b)(1) of the Act, 21 U.S.C. section 360bbb-3(b)(1), unless the authorization is terminated or revoked sooner. Performed at [facility], [address] [place], [place] [address]      Studies: Ct Head Wo Contrast  Result Date: 05/17/2019 CLINICAL DATA:  History of cerebral palsy, lower extremity weakness EXAM: CT HEAD WITHOUT CONTRAST CT CERVICAL SPINE WITHOUT CONTRAST TECHNIQUE: Multidetector CT imaging of the head and cervical spine was performed following the standard protocol without intravenous contrast. Multiplanar CT image reconstructions of the cervical spine were also generated. COMPARISON:  06/22/2014 FINDINGS: CT HEAD FINDINGS Brain: No evidence of acute infarction, hemorrhage, hydrocephalus, extra-axial collection or mass lesion/mass effect. Periventricular and deep white matter hypodensity with unchanged foci of encephalomalacia of the left parietal lobe. Vascular: No hyperdense vessel or unexpected calcification. Skull: Normal. Negative  for fracture or focal lesion. Sinuses/Orbits: No acute finding. Other: None. CT CERVICAL SPINE FINDINGS Alignment: Normal. Skull base and vertebrae: No acute fracture. No primary bone lesion or focal pathologic process. Soft tissues and spinal canal: No prevertebral fluid or swelling. No visible canal hematoma. Disc levels: Severe multilevel disc degenerative disease and osteophytosis of the cervical spine. Upper chest: Bandlike scarring of the included bilateral lung apices with wedge resection line of the left lung apex. Other: None. IMPRESSION: 1. No acute intracranial pathology. Small-vessel white matter disease and unchanged encephalomalacia of the left parietal lobe. 2. No fracture or static subluxation of the cervical spine. Severe multilevel disc degenerative disease and osteophytosis. Electronically Signed   By: Lauralyn PrimesAlex  Bibbey M.D.   On: 05/17/2019 13:30   Ct Cervical Spine Wo Contrast  Result Date: 05/17/2019 CLINICAL DATA:  History of cerebral palsy, lower extremity weakness EXAM: CT HEAD WITHOUT CONTRAST CT CERVICAL SPINE WITHOUT CONTRAST TECHNIQUE: Multidetector CT imaging of the head and cervical spine was performed following the standard protocol without intravenous contrast. Multiplanar CT image reconstructions of the cervical spine were also generated. COMPARISON:  06/22/2014 FINDINGS: CT  HEAD FINDINGS Brain: No evidence of acute infarction, hemorrhage, hydrocephalus, extra-axial collection or mass lesion/mass effect. Periventricular and deep white matter hypodensity with unchanged foci of encephalomalacia of the left parietal lobe. Vascular: No hyperdense vessel or unexpected calcification. Skull: Normal. Negative for fracture or focal lesion. Sinuses/Orbits: No acute finding. Other: None. CT CERVICAL SPINE FINDINGS Alignment: Normal. Skull base and vertebrae: No acute fracture. No primary bone lesion or focal pathologic process. Soft tissues and spinal canal: No prevertebral fluid or swelling.  No visible canal hematoma. Disc levels: Severe multilevel disc degenerative disease and osteophytosis of the cervical spine. Upper chest: Bandlike scarring of the included bilateral lung apices with wedge resection line of the left lung apex. Other: None. IMPRESSION: 1. No acute intracranial pathology. Small-vessel white matter disease and unchanged encephalomalacia of the left parietal lobe. 2. No fracture or static subluxation of the cervical spine. Severe multilevel disc degenerative disease and osteophytosis. Electronically Signed   By: Eddie Candle M.D.   On: 05/17/2019 13:30   Mr Brain Wo Contrast  Result Date: 05/17/2019 CLINICAL DATA:  85 year old female with lower extremity weakness. EXAM: MRI HEAD WITHOUT CONTRAST TECHNIQUE: Multiplanar, multiecho pulse sequences of the brain and surrounding structures were obtained without intravenous contrast. COMPARISON:  Head and cervical spine CT earlier today. Brain MRI 06/22/2014. FINDINGS: Brain: No restricted diffusion or evidence of acute infarction. Chronic disproportionate volume loss in the parietal lobes greater on the left. Suggestion of chronic cortical encephalomalacia in the left parietal lobe on series 12, image 17. Underlying lateral ventricle prominence (mostly colpocephaly) has not significantly changed since 2015. Superimposed confluent bilateral cerebral white matter T2 and FLAIR hyperintensity is chronic and involves the deep white matter capsules, mildly progressed since 2015. Superimposed bilateral basal ganglia and thalamic T2 heterogeneity has increased since 2015. Mild T2 heterogeneity in the pons has not significantly changed. No other cortical encephalomalacia identified. There are several chronic microhemorrhages in the brain, most notably the central brainstem on series 6, image 30. No midline shift, mass effect, evidence of mass lesion, ventriculomegaly, extra-axial collection or acute intracranial hemorrhage. Cervicomedullary  junction and pituitary are within normal limits. Vascular: Major intracranial vascular flow voids are stable since 2015. Skull and upper cervical spine: Cervical spine reported separately today. Visualized bone marrow signal is within normal limits. Sinuses/Orbits: Postoperative changes to both globes. Trace paranasal sinus mucosal thickening. Other: Mastoids are clear. Visible internal auditory structures appear normal. Scalp and face soft tissues appear negative. IMPRESSION: 1. No acute intracranial abnormality. 2. Chronic parietal lobe atrophy with suspected cortical encephalomalacia on the left. 3. Superimposed chronic but progressed since 2015 signal changes suggestive of chronic small vessel disease, including a small number of chronic micro-hemorrhages. Electronically Signed   By: Genevie Ann M.D.   On: 05/17/2019 17:54   Mr Cervical Spine Wo Contrast  Result Date: 05/17/2019 CLINICAL DATA:  85 year old female with lower extremity weakness. EXAM: MRI CERVICAL SPINE WITHOUT CONTRAST TECHNIQUE: Multiplanar, multisequence MR imaging of the cervical spine was performed. No intravenous contrast was administered. COMPARISON:  Brain MRI reported separately today. Cervical spine CT earlier today. FINDINGS: Alignment: Straightening of cervical lordosis as seen earlier today. Vertebrae: No marrow edema or evidence of acute osseous abnormality. Chronic degenerative endplate marrow signal changes. Cord: Spinal cord signal is within normal limits at all visualized levels. Posterior Fossa, vertebral arteries, paraspinal tissues: Cervicomedullary junction is within normal limits. Brain findings today reported separately. Preserved major vascular flow voids in the neck. Negative visible neck soft tissues. Disc levels:  C2-C3: Disc and posterior element degeneration. No spinal stenosis. Moderate to severe bilateral C3 foraminal stenosis. C3-C4: Disc space loss with circumferential disc osteophyte complex and moderate posterior  element hypertrophy greater on the left. Mild spinal stenosis, mild if any cord mass effect. Severe bilateral C4 foraminal stenosis. C4-C5: Right eccentric circumferential disc osteophyte complex. Mild facet hypertrophy. No spinal stenosis. Mild to moderate left and severe right C5 foraminal stenosis. C5-C6: Mostly far lateral and anterior circumferential disc osteophyte complex with mild facet hypertrophy. No spinal stenosis. Severe right greater than left C6 foraminal stenosis. C6-C7: Severe disc space loss with bulky circumferential disc osteophyte complex. Mild facet hypertrophy. Mild spinal stenosis, no cord mass effect. Moderate to severe left and severe right C7 foraminal stenosis. C7-T1: Circumferential disc bulge with endplate spurring. Mild posterior element hypertrophy. No spinal stenosis. Moderate to severe bilateral C8 foraminal stenosis. T1-T2 degeneration with foraminal but no spinal stenosis. IMPRESSION: 1.  No acute osseous abnormality in the cervical spine. 2. Diffuse cervical spine degeneration. - mild spinal stenosis at C3-C4 and C6-C7 with up to mild spinal cord mass effect, but no cord signal abnormality. - diffuse moderate and severe bilateral cervical neural foraminal stenosis. Electronically Signed   By: Odessa Fleming M.D.   On: 05/17/2019 18:00    Scheduled Meds: . amLODipine  10 mg Oral Daily  . brimonidine  1 drop Both Eyes BID  . dorzolamide-timolol  1 drop Both Eyes BID  . gatifloxacin  1 drop Left Eye QID  . heparin  5,000 Units Subcutaneous Q8H  . insulin aspart  0-5 Units Subcutaneous QHS  . insulin aspart  0-9 Units Subcutaneous TID WC  . latanoprost  1 drop Both Eyes QHS  . sodium chloride flush  3 mL Intravenous Q12H   Continuous Infusions: . sodium chloride 75 mL/hr at 05/18/19 1216    Principal Problem:   Rhabdomyolysis Active Problems:   UTI (urinary tract infection)   HTN (hypertension)   DM type 2 without retinopathy (HCC)   Renal insufficiency   Elevated  transaminase level   Cerebral palsy (HCC)    Time spent: 45 minutes    [facility] M NP  [facility]  If 7PM-7AM, please contact night-coverage at [email or web address], password [facility] 05/18/2019, 12:39 PM  LOS: 0 days

## 2019-05-19 LAB — BASIC METABOLIC PANEL
Anion gap: 8 (ref 5–15)
BUN: 19 mg/dL (ref 8–23)
CO2: 24 mmol/L (ref 22–32)
Calcium: 8 mg/dL — ABNORMAL LOW (ref 8.9–10.3)
Chloride: 110 mmol/L (ref 98–111)
Creatinine, Ser: 0.59 mg/dL (ref 0.44–1.00)
GFR calc Af Amer: >60 mL/min (ref >60)
GFR calc non Af Amer: >60 mL/min (ref >60)
Glucose, Bld: 196 mg/dL — ABNORMAL HIGH (ref 70–99)
Potassium: 3.9 mmol/L (ref 3.5–5.1)
Sodium: 142 mmol/L (ref 135–145)

## 2019-05-19 LAB — CK: Total CK: 308 U/L — ABNORMAL HIGH (ref 38–234)

## 2019-05-19 LAB — GLUCOSE, CAPILLARY
Glucose-Capillary: 233 mg/dL — ABNORMAL HIGH (ref 70–99)
Glucose-Capillary: 238 mg/dL — ABNORMAL HIGH (ref 70–99)
Glucose-Capillary: 174 mg/dL — ABNORMAL HIGH (ref 70–99)
Glucose-Capillary: 221 mg/dL — ABNORMAL HIGH (ref 70–99)

## 2019-05-19 LAB — URINE CULTURE

## 2019-05-19 MED ORDER — LATANOPROST 0.005 % OP SOLN
1.0000 [drp] | Freq: Every day | OPHTHALMIC | Status: DC
  Administered 2019-05-19 – 2019-05-20 (×2): 1 [drp] via OPHTHALMIC
  Filled 2019-05-19: qty 2.5

## 2019-05-19 NOTE — Progress Notes (Signed)
Hospitalist progress note  If 7PM-7AM,  night-coverage-look on AMION -prefer pages-not epic chat,please  Julia [name] [id number] DOB: [birth year] DOA: 05/17/2019  PCP: Rogers Blocker, MD   Narrative:  86 aaf  CP with deficit in RUE and LLE -uses cruthces at baseline,  HTn DM ty ii c Nephropathy, o[pthalmopathy/cataract BPPV Diagnosed prior with vertigo  Admit 11/8 with  ~ 1 week h/o gen weakness and debility-wasn't able to use her crutches Was weak, MRI neg CT neg and neg for bony pathology AKI noted bun/creat 75/1.2 over baseline creat 0.81founf to have rhabdo with CK 1200   Assessment & Plan:  Non traumatic Rhabdo  Possibly secondary to being down-stop  checks-stop fluids monitor trends no  need to repeat as labs are signif  improved form prior AKI  Losartan held from admit-Rechekc am  Renal panel Cerebral palsy with gen weak  Has been WC bound for about 1 yr  Only uses cruthces at tiomes  Therapy rec SNF--Will send in next  day or so or as available by CM/TOC HTn  Cont Amlodipine-unctontrolled at times  Augment as prn--nitrate would be next  addition DM ty ii  CBG 196-240's-cont SSI  Resume Glipizide 5 xl on d/c and  metformin 500 mg XR Transaminitis on admit  No Inciting ,meds-was probably   2/2 Rhabdo  Subjective: Awake alert coherent joking in nad Consultants:   n Procedures:   n Antimicrobials:   n  Objective: Vitals:   05/19/19 0016 05/19/19 0422 05/19/19 0748 05/19/19 1120  BP: 127/65 (!) 142/65 (!) 170/68 113/64  Pulse: 69 68 73 66  Resp: 20 20 16 16   Temp: 98.1 F (36.7 C) 99.7 F (37.6 C) 98.2 F (36.8 C) 98.6 F (37 C)  TempSrc: Oral Oral Oral Oral  SpO2: 96% 98% 99% 98%  Weight:      Height:        Intake/Output Summary (Last 24 hours) at 05/19/2019 1541 Last data filed at 05/19/2019 0423 Gross per 24 hour  Intake 274.86 ml  Output 900 ml  Net -625.14 ml   Filed Weights   05/17/19 1118 05/17/19 2100  Weight: 56 kg 49.6 kg     Examination: eokmincat no focal deficit moving limbs x 4 but weaker in RUE and LLE s1 s 2 no m/r/g abd soft Power decreased in above limbs eomi  Data Reviewed: I have personally reviewed following labs and imaging studies  Scheduled Meds: . amLODipine  10 mg Oral Daily  . brimonidine  1 drop Both Eyes BID  . dorzolamide-timolol  1 drop Both Eyes BID  . gatifloxacin  1 drop Left Eye QID  . heparin  5,000 Units Subcutaneous Q8H  . insulin aspart  0-5 Units Subcutaneous QHS  . insulin aspart  0-9 Units Subcutaneous TID WC  . latanoprost  1 drop Both Eyes QHS  . sodium chloride flush  3 mL Intravenous Q12H   Continuous Infusions:  Radiology Studies: Reviewed images personally in health database    LOS: 1 day   Time spent: Wann, MD [facility]  05/19/2019, 3:41 PM

## 2019-05-19 NOTE — Evaluation (Signed)
Clinical/Bedside Swallow Evaluation Patient Details  Name: Julia [name] MRN: [id number] Date of Birth: December 24, 1931  Today's Date: 05/19/2019 Time: SLP Start Time (ACUTE ONLY): 1409 SLP Stop Time (ACUTE ONLY): 1431 SLP Time Calculation (min) (ACUTE ONLY): 22 min  Past Medical History:  Past Medical History:  Diagnosis Date  . Cerebral palsy (HCC)   . Dizziness and giddiness   . Essential hypertension, malignant   . GERD (gastroesophageal reflux disease)   . Hypokalemia   . Kidney stones   . Sarcoidosis   . Type II or unspecified type diabetes mellitus without mention of complication, not stated as uncontrolled   . Unspecified vitamin D deficiency    Past Surgical History:  Past Surgical History:  Procedure Laterality Date  . ABDOMINAL HYSTERECTOMY    . cerebral palsy surgeries    . CESAREAN SECTION    . EYE SURGERY    . GAS/FLUID EXCHANGE Left 11/29/2015   Procedure: GAS/FLUID EXCHANGE;  Surgeon: Sherrie George, MD;  Location: [facility] OR;  Service: Ophthalmology;  Laterality: Left;  . LASER PHOTO ABLATION Left 11/29/2015   Procedure: LASER PHOTO ABLATION LEFT EYE;  Surgeon: Sherrie George, MD;  Location: [facility] OR;  Service: Ophthalmology;  Laterality: Left;  . LUNG REMOVAL, PARTIAL    . MULTIPLE TOOTH EXTRACTIONS    . PARS PLANA VITRECTOMY Left 11/29/2015   WITH 25G REMOVAL/SUTURE INTRAOCULAR LENS LEFT EYE  . PARS PLANA VITRECTOMY Left 11/29/2015   Procedure: PARS PLANA VITRECTOMY WITH 25G REMOVAL/SUTURE INTRAOCULAR LENS LEFT EYE;  Surgeon: Sherrie George, MD;  Location: [facility] OR;  Service: Ophthalmology;  Laterality: Left;  . TONSILLECTOMY     HPI:  Pt is an 85 yo female presenting with generalized weakness. MRI Brain negative for acute changes. PMH: DM, sarcoidosis, GERD, HTN, CP. Esophagram 2015 showed a narrowing at the GE junction and tertiary contractions.   Assessment / Plan / Recommendation Clinical Impression  Pt with a h/o esophageal dysphagia describes ongoing but  intermittent issues at home with swallowing, characterized by coughing and need to regurgitate small pieces of food. Today she has prolonged coughing with PO intake that begins after trying to eat small pieces of graham cracker. Coughing seemed to subside with time and small sips of water. Although I think this may be primarily esophageal in nature, she could be at risk for suboptimal oropharyngeal swallow given her acute onset of generalized weakness. Will adjust diet to Dys 2 solids, continuing thin liquids, with meds whole in puree. Would crush pills if they are larger to better facilitate esophageal clearance. SLP will f/u for potential to advance back to baseline textures as her overall strength improves.  SLP Visit Diagnosis: Dysphagia, pharyngoesophageal phase (R13.14)    Aspiration Risk  Mild aspiration risk    Diet Recommendation Dysphagia 2 (Fine chop);Thin liquid   Liquid Administration via: Cup;Straw Medication Administration: Whole meds with puree(crush if larger) Supervision: Patient able to self feed;Intermittent supervision to cue for compensatory strategies Compensations: Slow rate;Small sips/bites;Follow solids with liquid Postural Changes: Seated upright at 90 degrees;Remain upright for at least 30 minutes after po intake    Other  Recommendations Oral Care Recommendations: Oral care BID   Follow up Recommendations (tba)      Frequency and Duration min 2x/week  2 weeks       Prognosis Prognosis for Safe Diet Advancement: Good      Swallow Study   General HPI: Pt is an 85 yo female presenting with generalized weakness. MRI Brain negative  for acute changes. PMH: DM, sarcoidosis, GERD, HTN, CP. Esophagram 2015 showed a narrowing at the GE junction and tertiary contractions. Type of Study: Bedside Swallow Evaluation Previous Swallow Assessment: none in chart Diet Prior to this Study: Regular;Thin liquids Temperature Spikes Noted: No Respiratory Status: Room  air History of Recent Intubation: No Behavior/Cognition: Alert;Cooperative Oral Cavity Assessment: Within Functional Limits Oral Care Completed by SLP: No Oral Cavity - Dentition: Adequate natural dentition Vision: Functional for self-feeding Self-Feeding Abilities: Able to feed self Patient Positioning: Upright in chair Baseline Vocal Quality: Normal Volitional Swallow: Able to elicit    Oral/Motor/Sensory Function Overall Oral Motor/Sensory Function: Within functional limits   Ice Chips Ice chips: Not tested   Thin Liquid Thin Liquid: Within functional limits Presentation: Cup;Self Fed;Straw    Nectar Thick Nectar Thick Liquid: Not tested   Honey Thick Honey Thick Liquid: Not tested   Puree Puree: Within functional limits Presentation: Self Fed;Spoon   Solid     Solid: Impaired Presentation: Self Fed Pharyngeal Phase Impairments: Cough - Immediate;Cough - Delayed      Venita Sheffield Rajean Desantiago 05/19/2019,3:26 PM  Pollyann Glen, M.A. [place] Acute Environmental education officer [phone] Office [phone]

## 2019-05-19 NOTE — ED Provider Notes (Signed)
  Physical Exam  BP 113/64 (BP Location: Right Arm)   Pulse 66   Temp 98.6 F (37 C) (Oral)   Resp 16   Ht 5' (1.524 m)   Wt 49.6 kg   SpO2 98%   BMI 21.36 kg/m   Physical Exam  ED Course/Procedures     Procedures  MDM  Receive care of pt at 330PM. Briefly, this is an 85yo female with history of CP who presents with concern for generalized weakness. Typically lives alone and wheelchair bound due to CP however has been unable to get around her home due to new UE weakness, pt reports worse on right.  MRI brain and CSpine pending.  MRI shows degenerative changes of spine and no acute CVA.  Labs show mildly elevated CK, elevated Cr in comparison to prior, UTI. Will admit for presumed AKI, dehydration, infection and generalized weakness.        Gareth Morgan, MD 05/19/19 1300

## 2019-05-19 NOTE — Progress Notes (Signed)
Physical Therapy Treatment Patient Details Name: Julia [name] MRN: [id number] DOB: 07-23-1931 Today's Date: 05/19/2019    History of Present Illness Julia [name] is a 85 y.o. female with medical history significant for cerebral palsy, hypertension, and type 2 diabetes mellitus, now presenting to emergency department for evaluation of generalized weakness.    PT Comments    Pt performed supine LE strengthening as her largest limiting factor is LE weakness.  She remains aggreeable to SNF placement.  Pt required decreased assistance to stand with use of sara stedy sit to stand lift.   She is requiring mod to max assistance.  Plan for continued strengthening and progression of functional mobility during acute stay.    Follow Up Recommendations  SNF;Supervision/Assistance - 24 hour     Equipment Recommendations  None recommended by PT(has all dme)    Recommendations for Other Services       Precautions / Restrictions Precautions Precautions: Fall Precaution Comments: h/o cp Restrictions Weight Bearing Restrictions: No    Mobility  Bed Mobility Overal bed mobility: Needs Assistance Bed Mobility: Rolling   Sidelying to sit: Max assist       General bed mobility comments: Pt required assistance to move B LEs to edge of bed and to elevate trunk into sitting.  Pt presents with lateral baseline bend in spine while sitting.  Transfers Overall transfer level: Needs assistance Equipment used: Ambulation equipment used(sara stedy) Transfers: Sit to/from [facility] Sit to Stand: Mod assist(with use of sara stedy) Stand pivot transfers: Total assist(in sara stedy)       General transfer comment: Pt with improved success to achieve standing with use of sara stedy.  She performed x3 standing trials .  She attempts to rest forearms on stedy plates and required cues to lean forward and remove arms from plates so they could be positioned behind her for pivot to  recliner.  Ambulation/Gait Ambulation/Gait assistance: (NT, standing remains difficult)               Stairs             Wheelchair Mobility    Modified Rankin (Stroke Patients Only)       Balance Overall balance assessment: Needs assistance Sitting-balance support: Feet supported;Bilateral upper extremity supported Sitting balance-Leahy Scale: Poor     Standing balance support: Bilateral upper extremity supported Standing balance-Leahy Scale: Zero                              Cognition Arousal/Alertness: Awake/alert Behavior During Therapy: WFL for tasks assessed/performed Overall Cognitive Status: Within Functional Limits for tasks assessed                                        Exercises General Exercises - Lower Extremity Ankle Circles/Pumps: AROM;Left;10 reps;Supine Quad Sets: AROM;Both;10 reps;Supine Heel Slides: AAROM;Both;10 reps;Supine Hip ABduction/ADduction: AAROM;Both;10 reps;Supine    General Comments        Pertinent Vitals/Pain Pain Assessment: No/denies pain    Home Living                      Prior Function            PT Goals (current goals can now be found in the care plan section) Acute Rehab PT Goals Patient Stated Goal: home alone Potential to  Achieve Goals: Good Progress towards PT goals: Progressing toward goals    Frequency    Min 3X/week      PT Plan Current plan remains appropriate    Co-evaluation              AM-PAC PT "6 Clicks" Mobility   Outcome Measure  Help needed turning from your back to your side while in a flat bed without using bedrails?: A Lot Help needed moving from lying on your back to sitting on the side of a flat bed without using bedrails?: A Lot Help needed moving to and from a bed to a chair (including a wheelchair)?: A Lot Help needed standing up from a chair using your arms (e.g., wheelchair or bedside chair)?: A Lot Help needed to walk  in hospital room?: Total Help needed climbing 3-5 steps with a railing? : Total 6 Click Score: 10    End of Session Equipment Utilized During Treatment: Gait belt Activity Tolerance: Patient tolerated treatment well Patient left: in chair;with call bell/phone within reach;with chair alarm set Nurse Communication: Mobility status PT Visit Diagnosis: Unsteadiness on feet (R26.81);Difficulty in walking, not elsewhere classified (R26.2)     Time: 9030-0923 PT Time Calculation (min) (ACUTE ONLY): 29 min  Charges:  $Therapeutic Exercise: 8-22 mins $Therapeutic Activity: 8-22 mins                     Bonney Leitz , PTA Acute Rehabilitation Services Pager [phone] Office [phone]     Julia [name] Artis Delay 05/19/2019, 11:32 AM

## 2019-05-20 LAB — GLUCOSE, CAPILLARY
Glucose-Capillary: 150 mg/dL — ABNORMAL HIGH (ref 70–99)
Glucose-Capillary: 140 mg/dL — ABNORMAL HIGH (ref 70–99)
Glucose-Capillary: 237 mg/dL — ABNORMAL HIGH (ref 70–99)
Glucose-Capillary: 267 mg/dL — ABNORMAL HIGH (ref 70–99)

## 2019-05-20 NOTE — Progress Notes (Signed)
  Speech Language Pathology Treatment: Dysphagia  Patient Details Name: Julia [name] MRN: [id number] DOB: 1932/03/17 Today's Date: 05/20/2019 Time: 1535-1550 SLP Time Calculation (min) (ACUTE ONLY): 15 min  Assessment / Plan / Recommendation Clinical Impression  Pt continues to exhibit signs of dysphagia and coughing that occurs with solids > liquids. RN reports coughing noted during lunch as well. Pt is agreeable to proceeding with MBS, which can be done on next date at the earliest. Will not change diet pending completion, but would hold POs (or specific foods) if a lot of coughing is occurring. Would follow aspiration and esophageal precautions as well.    HPI HPI: Pt is an 85 yo female presenting with generalized weakness. MRI Brain negative for acute changes. PMH: DM, sarcoidosis, GERD, HTN, CP. Esophagram 2015 showed a narrowing at the GE junction and tertiary contractions.      SLP Plan  MBS       Recommendations  Diet recommendations: Dysphagia 2 (fine chop);Thin liquid Liquids provided via: Cup Medication Administration: Crushed with puree Supervision: Patient able to self feed;Full supervision/cueing for compensatory strategies Compensations: Slow rate;Small sips/bites;Follow solids with liquid Postural Changes and/or Swallow Maneuvers: Seated upright 90 degrees;Upright 30-60 min after meal                Oral Care Recommendations: Oral care BID Follow up Recommendations: Skilled Nursing facility SLP Visit Diagnosis: Dysphagia, pharyngoesophageal phase (R13.14) Plan: MBS       GO                Julia [name] 05/20/2019, 3:54 PM  Pollyann Glen, M.A. [facility] Acute Environmental education officer [phone] Office [phone]

## 2019-05-20 NOTE — Progress Notes (Signed)
Julia [name]  PROGRESS NOTE    Julia [name]  HUT:[id number] DOB: [birth year] DOA: 05/17/2019 PCP: Julia Bender, MD   Brief Narrative:   Admit 11/8 with  ~ 1 week h/o gen weakness and debility-wasn't able to use her crutches. Was weak, MRI neg CT neg and neg for bony pathology AKI noted bun/creat 75/1.2 over baseline creat 0.41founf to have rhabdo with CK 1200  11/11: Denies complaints this AM. Wants to go home after rehab.   Assessment & Plan:   Principal Problem:   Rhabdomyolysis Active Problems:   HTN (hypertension)   DM type 2 without retinopathy (HCC)   Cerebral palsy (HCC)   Renal insufficiency   Elevated transaminase level   UTI (urinary tract infection)  Non traumatic Rhabdomyolysis     - Possibly secondary to being down     - trending down  AKI     - losartan held from admit     - resovled  Cerebral palsy General Weakness     - WC bound for about 1 yr     - PT recs SNF     - has spot for tomorrow; will d/c then  HTN     - continue amlodipine; BP ok today  DM2     - continue SSI     - resume home regimen at d/c  Transaminitis     - no medication issues     - likely rhabdo related     - trending down  DVT prophylaxis: Heparin Code Status: FULL   Disposition Plan: To [facility] 11/12   ROS:  Denies CP, ab pain, dyspnea, N, V. Remainder 10-pt ROS is negative for all not previously mentioned.  Subjective: "What happens after rehab?"  Objective: Vitals:   05/20/19 0014 05/20/19 0337 05/20/19 0834 05/20/19 1217  BP: (!) 127/117 128/74 (!) 128/54 (P) 130/69  Pulse: 66 65 75 (P) 75  Resp: 16 17 18  (P) 18  Temp: 97.8 F (36.6 C) 98.7 F (37.1 C) 97.7 F (36.5 C) (P) 98.2 F (36.8 C)  TempSrc: Oral Oral Oral (P) Oral  SpO2: 100% 98% 98% (P) 98%  Weight:      Height:        Intake/Output Summary (Last 24 hours) at 05/20/2019 1238 Last data filed at 05/20/2019 13/05/2019 Gross per 24 hour  Intake 63 ml  Output -  Net 63 ml   Filed Weights   05/17/19  1118 05/17/19 2100  Weight: 56 kg 49.6 kg    Examination:  General: 85 y.o. female resting in bed in NAD Cardiovascular: RRR, +S1, S2, no m/g/r, equal pulses throughout Respiratory: CTABL, no w/r/r, normal WOB GI: BS+, NDNT, no masses noted, no organomegaly noted MSK: No e/c/c Neuro: Alert to name, follows commands   Data Reviewed: I have personally reviewed following labs and imaging studies.  CBC: Recent Labs  Lab 05/17/19 1151 05/18/19 0351  WBC 12.2* 9.3  NEUTROABS 9.6*  --   HGB 15.5* 12.6  HCT 48.7* 39.1  MCV 79.6* 78.8*  PLT 242 178   Basic Metabolic Panel: Recent Labs  Lab 05/17/19 1151 05/18/19 0351 05/19/19 0310  NA 143 142 142  K 4.3 2.9* 3.9  CL 107 111 110  CO2 24 23 24   GLUCOSE 248* 231* 196*  BUN 75* 36* 19  CREATININE 1.25* 0.74 0.59  CALCIUM 9.0 7.7* 8.0*  MG  --  2.0  --    GFR: Estimated Creatinine Clearance: 36.9 mL/min (by C-G formula  based on SCr of 0.59 mg/dL). Liver Function Tests: Recent Labs  Lab 05/17/19 1151 05/18/19 0351  AST 79* 45*  ALT 68* 50*  ALKPHOS 101 75  BILITOT 1.6* 0.9  PROT 7.0 5.3*  ALBUMIN 3.2* 2.4*   No results for input(s): LIPASE, AMYLASE in the last 168 hours. No results for input(s): AMMONIA in the last 168 hours. Coagulation Profile: No results for input(s): INR, PROTIME in the last 168 hours. Cardiac Enzymes: Recent Labs  Lab 05/17/19 1151 05/18/19 0351 05/19/19 0310  CKTOTAL 1,623* 854* 308*   BNP (last 3 results) No results for input(s): PROBNP in the last 8760 hours. HbA1C: Recent Labs    05/18/19 0351  HGBA1C 7.2*   CBG: Recent Labs  Lab 05/19/19 1423 05/19/19 1740 05/19/19 2133 05/20/19 0617 05/20/19 1221  GLUCAP 238* 174* 221* 150* 140*   Lipid Profile: No results for input(s): CHOL, HDL, LDLCALC, TRIG, CHOLHDL, LDLDIRECT in the last 72 hours. Thyroid Function Tests: No results for input(s): TSH, T4TOTAL, FREET4, T3FREE, THYROIDAB in the last 72 hours. Anemia Panel: No  results for input(s): VITAMINB12, FOLATE, FERRITIN, TIBC, IRON, RETICCTPCT in the last 72 hours. Sepsis Labs: No results for input(s): PROCALCITON, LATICACIDVEN in the last 168 hours.  Recent Results (from the past 240 hour(s))  Culture, Urine     Status: Abnormal   Collection Time: 05/17/19  7:19 PM   Specimen: Urine, Random  Result Value Ref Range Status   Specimen Description URINE, RANDOM  Final   Special Requests   Final    ADDED 2124 Performed at [facility], [address] [place], [place] [address]    Culture MULTIPLE SPECIES PRESENT, SUGGEST RECOLLECTION (A)  Final   Report Status 05/19/2019 FINAL  Final  SARS CORONAVIRUS 2 (TAT 6-24 HRS) Nasopharyngeal Nasopharyngeal Swab     Status: None   Collection Time: 05/17/19  8:32 PM   Specimen: Nasopharyngeal Swab  Result Value Ref Range Status   SARS Coronavirus 2 NEGATIVE NEGATIVE Final    Comment: (NOTE) SARS-CoV-2 target nucleic acids are NOT DETECTED. The SARS-CoV-2 RNA is generally detectable in upper and lower respiratory specimens during the acute phase of infection. Negative results do not preclude SARS-CoV-2 infection, do not rule out co-infections with other pathogens, and should not be used as the sole basis for treatment or other patient management decisions. Negative results must be combined with clinical observations, patient history, and epidemiological information. The expected result is Negative. Fact Sheet for Patients: HairSlick.nohttps://www.fda.gov/media/138098/download Fact Sheet for Healthcare Providers: quierodirigir.comhttps://www.fda.gov/media/138095/download This test is not yet approved or cleared by the [place] FDA and  has been authorized for detection and/or diagnosis of SARS-CoV-2 by FDA under an Emergency Use Authorization (EUA). This EUA will remain  in effect (meaning this test can be used) for the duration of the COVID-19 declaration under Section 56 4(b)(1) of the Act, 21 U.S.C. section  360bbb-3(b)(1), unless the authorization is terminated or revoked sooner. Performed at [facility], [address] [place], [place] [address]       Radiology Studies: No results found.   Scheduled Meds: . amLODipine  10 mg Oral Daily  . brimonidine  1 drop Both Eyes BID  . dorzolamide-timolol  1 drop Both Eyes BID  . gatifloxacin  1 drop Left Eye QID  . heparin  5,000 Units Subcutaneous Q8H  . insulin aspart  0-5 Units Subcutaneous QHS  . insulin aspart  0-9 Units Subcutaneous TID WC  . latanoprost  1  drop Both Eyes QHS  . sodium chloride flush  3 mL Intravenous Q12H   Continuous Infusions:   LOS: 2 days    Time spent: 25 minutes spent in the coordination of care today.    Jonnie Finner, DO [facility] Pager [phone]  If 7PM-7AM, please contact night-coverage [email or web address] Password [facility] 05/20/2019, 12:38 PM

## 2019-05-21 ENCOUNTER — Inpatient Hospital Stay (HOSPITAL_COMMUNITY): Payer: Medicare Other

## 2019-05-21 LAB — GLUCOSE, CAPILLARY
Glucose-Capillary: 172 mg/dL — ABNORMAL HIGH (ref 70–99)
Glucose-Capillary: 161 mg/dL — ABNORMAL HIGH (ref 70–99)

## 2019-05-21 NOTE — Progress Notes (Signed)
Facility Nurse Diane LPN called for report. Report given

## 2019-05-21 NOTE — Progress Notes (Signed)
Modified Barium Swallow Progress Note  Patient Details  Name: Julia [name] MRN: [id number] Date of Birth: 12/19/1931  Today's Date: 05/21/2019  Modified Barium Swallow completed.  Full report located under Chart Review in the Imaging Section.  Brief recommendations include the following:  Clinical Impression  Pt presents with severe pharyngocervical esophageal dysphagia c/b sensorimotor deficits.  Diminished motility results in poor epiglottic deflection, inadequate airway closure, decreased UES relaxation *exacerbated possibly by cervical osteophyte* and gross pharyngeal retention (mixed with secretions) which pt does not sense.    Various postures attempted to improve airway protection and head turn right with supraglottic effortful swallow most helpful but still not preventative of aspiration nor allowing full clearance of residuals.  Cued "hock" not effective but dry swallows helpful to decrease residuals.  Residuals much worse with increased viscocity - SLP did not provide barium tablet due to concerns for aspiration.    Of note, pt with wet vocal quality before, during and after MBS which she did not clear with cued throat clear/cough. Pt states she has been "swallowing my whole life" and states gurgly voicing has been presents since she "had whooping cough".   Highly suspect this pt has chronic aspiration and has managed.  She does admit to requiring heimlich manuever approx six months ago when she aspirated meat.    Will address with MD re: care plan for pt.  Pt was educated during testing to inability to prevent aspiration, decreased sensation and weak cough/expectoration to clear pharynx as well as increased pulmonary ramificaitons - She advised she wants to eat regardless.   Swallow Evaluation Recommendations       SLP Diet Recommendations: (pt wants to eat regardless of risk and is very clear in her wishes)   Liquid Administration via: Straw   Medication Administration: Other  (Comment)(consider crushed with liquids)   Supervision: Patient able to self feed   Compensations: Slow rate;Small sips/bites;Follow solids with liquid;Effortful swallow(head turn to the right)   Postural Changes: Seated upright at 90 degrees;Remain semi-upright after after feeds/meals (Comment)   Oral Care Recommendations: Oral care QID       Luanna Salk, MS [facility] SLP Acute Rehab Services Pager [phone] Office [phone]  Macario Golds 05/21/2019,9:46 AM

## 2019-05-21 NOTE — Progress Notes (Addendum)
Pt d/c to [facility]. Pt is alert and oriented with no new concerns. Pt is on Rm air and will be transported out of hospital by [facility].  RN called facility twice to give report to receiving nurse, RN left phone number and name with Jan the phone receptionist for facility nurse to call back to get d/c report.

## 2019-05-21 NOTE — Progress Notes (Signed)
  Speech Language Pathology Treatment: Dysphagia  Patient Details Name: Julia [name] MRN: [id number] DOB: [birth year] Today's Date: 05/21/2019 Time: 2620-3559 SLP Time Calculation (min) (ACUTE ONLY): 11 min  Assessment / Plan / Recommendation Clinical Impression  educated pt to dysphagia/aspiration mitigation strategies with moderate cues to clinical reasoning providing information in writing, informed pt of increased asp pna risk with her decreased mobility *uses wheelchair*, SLP showed pt video study of her swallow vs normal MBS using teach back, please assure pt has follow up SlP at next venue of care to maximize swallow recovery- suspect acute on chronic dysphagia for which she has managed.    HPI HPI: Pt is an 85 yo female presenting with generalized weakness. MRI Brain negative for acute changes but showed chronic atrophy on the left parietal region. PMH: DM, sarcoidosis, GERD, HTN, CP. Esophagram 2015 showed a narrowing at the GE junction and tertiary contractions. Pt with diffuse cervical spine degeneration per CT 05/19/2019  MBS completed and follow up indicated as pt to dc today to SNF.      SLP Plan  Continue with current plan of care       Recommendations  Diet recommendations: Dysphagia 3 (mechanical soft);Thin liquid Liquids provided via: Cup;Straw Medication Administration: (crushed with thin) Supervision: Patient able to self feed;Full supervision/cueing for compensatory strategies Compensations: Slow rate;Small sips/bites;Follow solids with liquid;Effortful swallow(supraglottic swallow) Postural Changes and/or Swallow Maneuvers: Seated upright 90 degrees;Upright 30-60 min after meal                Oral Care Recommendations: Oral care BID Follow up Recommendations: Skilled Nursing facility SLP Visit Diagnosis: Dysphagia, pharyngoesophageal phase (R13.14) Plan: Continue with current plan of care       GO                Macario Golds 05/21/2019,  10:55 AM   Luanna Salk, MS [facility] SLP Acute Rehab Services Pager [phone] Office [phone]

## 2019-05-21 NOTE — Progress Notes (Signed)
Physical Therapy Treatment Patient Details Name: Julia [name] MRN: [id number] DOB: 1932-03-04 Today's Date: 05/21/2019    History of Present Illness Julia [name] is a 85 y.o. female with medical history significant for cerebral palsy, hypertension, and type 2 diabetes mellitus, now presenting to emergency department for evaluation of generalized weakness.    PT Comments    Pt performed supine LE strengthening and ROM with AAROM-PROM.  Pt continues to require assistance to mobilize from bed to chair.  She require mod assistance to stand and total assistance to pivot.  Pt remains very deconditioned and continues to benefit from SNF placement to improve function and decrease caregiver burden before returning home.   Follow Up Recommendations  SNF;Supervision/Assistance - 24 hour     Equipment Recommendations  None recommended by PT(has all dme)    Recommendations for Other Services       Precautions / Restrictions Precautions Precautions: Fall Precaution Comments: h/o cp Restrictions Weight Bearing Restrictions: No    Mobility  Bed Mobility Overal bed mobility: Needs Assistance Bed Mobility: Supine to Sit     Supine to sit: Mod assist     General bed mobility comments: Pt required assistance to move to edge of bed and to elevate trunk into sitting edge of bed. PTA used chux pad to scoot hips forward and patient reached for PTA as railing with assistance to elevate trunk into sitting.  Transfers Overall transfer level: Needs assistance Equipment used: 2 person hand held assist(face to face) Transfers: Sit to/from [employer] Sit to Stand: Mod assist Stand pivot transfers: Total assist       General transfer comment: Pt able to rise from bed 3/4 of the way with moderate assistance.  She is only able to stand for 3-5 sec before returning to sitting.  Required total assistance to move from bed to recliner.  Ambulation/Gait Ambulation/Gait assistance:  (NT standing remains severly limited.)               Stairs             Wheelchair Mobility    Modified Rankin (Stroke Patients Only)       Balance     Sitting balance-Leahy Scale: Poor       Standing balance-Leahy Scale: Zero                              Cognition Arousal/Alertness: Awake/alert Behavior During Therapy: WFL for tasks assessed/performed Overall Cognitive Status: Within Functional Limits for tasks assessed                                 General Comments: very determined to return home alone and live to 85yo      Exercises General Exercises - Lower Extremity Ankle Circles/Pumps: AROM;10 reps;Supine;PROM;Both;Limitations Ankle Circles/Pumps Limitations: PROM on R Quad Sets: AROM;Both;10 reps;Supine Heel Slides: AAROM;Both;10 reps;Supine Hip ABduction/ADduction: AAROM;Both;10 reps;Supine    General Comments        Pertinent Vitals/Pain Pain Assessment: No/denies pain Faces Pain Scale: No hurt    Home Living                      Prior Function            PT Goals (current goals can now be found in the care plan section) Acute Rehab PT Goals Potential to Achieve Goals:  Good Progress towards PT goals: Progressing toward goals    Frequency    Min 3X/week      PT Plan Current plan remains appropriate    Co-evaluation              AM-PAC PT "6 Clicks" Mobility   Outcome Measure  Help needed turning from your back to your side while in a flat bed without using bedrails?: A Lot Help needed moving from lying on your back to sitting on the side of a flat bed without using bedrails?: A Lot Help needed moving to and from a bed to a chair (including a wheelchair)?: A Lot Help needed standing up from a chair using your arms (e.g., wheelchair or bedside chair)?: A Lot Help needed to walk in hospital room?: Total Help needed climbing 3-5 steps with a railing? : Total 6 Click Score: 10     End of Session Equipment Utilized During Treatment: Gait belt Activity Tolerance: Patient tolerated treatment well Patient left: in chair;with call bell/phone within reach;with chair alarm set Nurse Communication: Mobility status PT Visit Diagnosis: Unsteadiness on feet (R26.81);Difficulty in walking, not elsewhere classified (R26.2)     Time: 0102-7253 PT Time Calculation (min) (ACUTE ONLY): 19 min  Charges:  $Therapeutic Activity: 8-22 mins                     Erasmo Leventhal , PTA Acute Rehabilitation Services Pager [phone] Office [phone]     Orine Goga Eli Hose 05/21/2019, 1:32 PM

## 2019-05-21 NOTE — Care Management Important Message (Signed)
Important Message  Patient Details  Name: Julia [name] MRN: [id number] Date of Birth: 12/16/1931   Medicare Important Message Given:  Yes     Orbie Pyo 05/21/2019, 3:29 PM

## 2019-05-21 NOTE — Discharge Summary (Signed)
. Physician Discharge Summary  Julia [name] ZOX:[id number] DOB: 04-01-1932 DOA: 05/17/2019  PCP: Gwenyth Bender, MD  Admit date: 05/17/2019 Discharge date: 05/21/2019  Admitted From: Home Disposition:  Discharged to SNF Rehab  Recommendations for Outpatient Follow-up:  1. Follow up with PCP in 1-2 weeks 2. Please obtain BMP/CBC in one week  Discharge Condition: Stable  CODE STATUS: FULL   Brief/Interim Summary: Admit 11/8 with ~ 1 week h/o gen weakness and debility-wasn't able to use her crutches. Was weak, MRI neg CT neg and neg for bony pathology AKI noted bun/creat 75/1.2 over baseline creat 0.75. Found to have rhabdo with CK 1200.  Discharge Diagnoses:  Principal Problem:   Rhabdomyolysis Active Problems:   HTN (hypertension)   DM type 2 without retinopathy (HCC)   Cerebral palsy (HCC)   Renal insufficiency   Elevated transaminase level   UTI (urinary tract infection)  Non traumatic Rhabdomyolysis     - Possibly secondary to being down     - trending down  AKI     - losartan held from admit     - resovled  Cerebral palsy General Weakness     - WC bound for about 1 yr     - PT recs SNF  HTN     - continue amlodipine; BP ok today  DM2     - continue SSI     - resume home regimen at d/c (glipizide, metformin)  Transaminitis     - no medication issues     - likely rhabdo related     - trending down  Dysphagia Chronic aspiration     - per SLP: Pt presents with severe pharyngocervical esophageal dysphagia c/b sensorimotor deficits.  Diminished motility results in poor epiglottic deflection, inadequate airway closure, decreased UES relaxation *exacerbated possibly by cervical osteophyte* and gross pharyngeal retention (mixed with secretions) which pt does not sense.    Various postures attempted to improve airway protection and head turn right with supraglottic effortful swallow most helpful but still not preventative of aspiration nor allowing full  clearance of residuals.  Cued "hock" not effective but dry swallows helpful to decrease residuals     - SLP rec:          - SLP Diet Recommendations: (pt wants to eat regardless of risk and is very clear in her wishes)         - Liquid Administration via: Straw         - Medication Administration: Other (Comment)(consider crushed with liquids)         - Supervision: Patient able to self feed         - Compensations: Slow rate;Small sips/bites;Follow solids with liquid;Effortful swallow(head turn to the right)         - Postural Changes: Seated upright at 90 degrees;Remain semi-upright after after feeds/meals (Comment)         - Oral Care Recommendations: Oral care QID  Discharge Instructions   Allergies as of 05/21/2019      Reactions   Penicillins Other (See Comments)   "Made my feet sore" in childhood Has patient had a PCN reaction causing immediate rash, facial/tongue/throat swelling, SOB or lightheadedness with hypotension: no Has patient had a PCN reaction causing severe rash involving mucus membranes or skin necrosis: no Has patient had a PCN reaction that required hospitalization no Has patient had a PCN reaction occurring within the last 10 years: no If all of the above answers are "NO", then  may proceed with Cephalosporin use.      Medication List    STOP taking these medications   latanoprost 0.005 % ophthalmic solution Commonly known as: XALATAN   losartan 100 MG tablet Commonly known as: COZAAR   prednisoLONE acetate 1 % ophthalmic suspension Commonly known as: PRED FORTE     TAKE these medications   Alphagan P 0.1 % Soln Generic drug: brimonidine Place 1 drop into both eyes 2 (two) times daily.   amLODipine 10 MG tablet Commonly known as: NORVASC Take 10 mg by mouth daily.   aspirin EC 81 MG tablet Take 81 mg by mouth daily.   cholecalciferol 1000 units tablet Commonly known as: VITAMIN D Take 1,000 Units by mouth daily.   dorzolamide-timolol 22.3-6.8  MG/ML ophthalmic solution Commonly known as: COSOPT Place 1 drop into both eyes 2 (two) times daily.   gatifloxacin 0.5 % Soln Commonly known as: ZYMAXID Place 1 drop into the left eye 4 (four) times daily.   glipiZIDE 5 MG 24 hr tablet Commonly known as: GLUCOTROL XL Take 5 mg by mouth daily.   meclizine 12.5 MG tablet Commonly known as: ANTIVERT Take 12.5 mg by mouth 4 (four) times daily as needed. Nerve   metFORMIN 500 MG 24 hr tablet Commonly known as: GLUCOPHAGE-XR Take 500 mg by mouth daily.   multivitamin tablet Take 1 tablet by mouth daily.   omeprazole 20 MG capsule Commonly known as: PRILOSEC Take 20 mg by mouth 2 (two) times daily as needed (For heartburn or acid reflux.).   Travatan Z 0.004 % Soln ophthalmic solution Generic drug: Travoprost (BAK Free) Place 1 drop into both eyes at bedtime.      Contact information for after-discharge care    Destination    [facility] Preferred SNF .   Service: Skilled Nursing Contact information: [address] [place] [phone]             Allergies  Allergen Reactions  . Penicillins Other (See Comments)    "Made my feet sore" in childhood Has patient had a PCN reaction causing immediate rash, facial/tongue/throat swelling, SOB or lightheadedness with hypotension: no Has patient had a PCN reaction causing severe rash involving mucus membranes or skin necrosis: no Has patient had a PCN reaction that required hospitalization no Has patient had a PCN reaction occurring within the last 10 years: no If all of the above answers are "NO", then may proceed with Cephalosporin use.      Procedures/Studies: Ct Head Wo Contrast  Result Date: 05/17/2019 CLINICAL DATA:  History of cerebral palsy, lower extremity weakness EXAM: CT HEAD WITHOUT CONTRAST CT CERVICAL SPINE WITHOUT CONTRAST TECHNIQUE: Multidetector CT imaging of the head and cervical spine was performed following the  standard protocol without intravenous contrast. Multiplanar CT image reconstructions of the cervical spine were also generated. COMPARISON:  06/22/2014 FINDINGS: CT HEAD FINDINGS Brain: No evidence of acute infarction, hemorrhage, hydrocephalus, extra-axial collection or mass lesion/mass effect. Periventricular and deep white matter hypodensity with unchanged foci of encephalomalacia of the left parietal lobe. Vascular: No hyperdense vessel or unexpected calcification. Skull: Normal. Negative for fracture or focal lesion. Sinuses/Orbits: No acute finding. Other: None. CT CERVICAL SPINE FINDINGS Alignment: Normal. Skull base and vertebrae: No acute fracture. No primary bone lesion or focal pathologic process. Soft tissues and spinal canal: No prevertebral fluid or swelling. No visible canal hematoma. Disc levels: Severe multilevel disc degenerative disease and osteophytosis of the cervical spine. Upper chest: Bandlike scarring of  the included bilateral lung apices with wedge resection line of the left lung apex. Other: None. IMPRESSION: 1. No acute intracranial pathology. Small-vessel white matter disease and unchanged encephalomalacia of the left parietal lobe. 2. No fracture or static subluxation of the cervical spine. Severe multilevel disc degenerative disease and osteophytosis. Electronically Signed   By: Lauralyn Primes M.D.   On: 05/17/2019 13:30   Ct Cervical Spine Wo Contrast  Result Date: 05/17/2019 CLINICAL DATA:  History of cerebral palsy, lower extremity weakness EXAM: CT HEAD WITHOUT CONTRAST CT CERVICAL SPINE WITHOUT CONTRAST TECHNIQUE: Multidetector CT imaging of the head and cervical spine was performed following the standard protocol without intravenous contrast. Multiplanar CT image reconstructions of the cervical spine were also generated. COMPARISON:  06/22/2014 FINDINGS: CT HEAD FINDINGS Brain: No evidence of acute infarction, hemorrhage, hydrocephalus, extra-axial collection or mass  lesion/mass effect. Periventricular and deep white matter hypodensity with unchanged foci of encephalomalacia of the left parietal lobe. Vascular: No hyperdense vessel or unexpected calcification. Skull: Normal. Negative for fracture or focal lesion. Sinuses/Orbits: No acute finding. Other: None. CT CERVICAL SPINE FINDINGS Alignment: Normal. Skull base and vertebrae: No acute fracture. No primary bone lesion or focal pathologic process. Soft tissues and spinal canal: No prevertebral fluid or swelling. No visible canal hematoma. Disc levels: Severe multilevel disc degenerative disease and osteophytosis of the cervical spine. Upper chest: Bandlike scarring of the included bilateral lung apices with wedge resection line of the left lung apex. Other: None. IMPRESSION: 1. No acute intracranial pathology. Small-vessel white matter disease and unchanged encephalomalacia of the left parietal lobe. 2. No fracture or static subluxation of the cervical spine. Severe multilevel disc degenerative disease and osteophytosis. Electronically Signed   By: Lauralyn Primes M.D.   On: 05/17/2019 13:30   Mr Brain Wo Contrast  Result Date: 05/17/2019 CLINICAL DATA:  85 year old female with lower extremity weakness. EXAM: MRI HEAD WITHOUT CONTRAST TECHNIQUE: Multiplanar, multiecho pulse sequences of the brain and surrounding structures were obtained without intravenous contrast. COMPARISON:  Head and cervical spine CT earlier today. Brain MRI 06/22/2014. FINDINGS: Brain: No restricted diffusion or evidence of acute infarction. Chronic disproportionate volume loss in the parietal lobes greater on the left. Suggestion of chronic cortical encephalomalacia in the left parietal lobe on series 12, image 17. Underlying lateral ventricle prominence (mostly colpocephaly) has not significantly changed since 2015. Superimposed confluent bilateral cerebral white matter T2 and FLAIR hyperintensity is chronic and involves the deep white matter  capsules, mildly progressed since 2015. Superimposed bilateral basal ganglia and thalamic T2 heterogeneity has increased since 2015. Mild T2 heterogeneity in the pons has not significantly changed. No other cortical encephalomalacia identified. There are several chronic microhemorrhages in the brain, most notably the central brainstem on series 6, image 30. No midline shift, mass effect, evidence of mass lesion, ventriculomegaly, extra-axial collection or acute intracranial hemorrhage. Cervicomedullary junction and pituitary are within normal limits. Vascular: Major intracranial vascular flow voids are stable since 2015. Skull and upper cervical spine: Cervical spine reported separately today. Visualized bone marrow signal is within normal limits. Sinuses/Orbits: Postoperative changes to both globes. Trace paranasal sinus mucosal thickening. Other: Mastoids are clear. Visible internal auditory structures appear normal. Scalp and face soft tissues appear negative. IMPRESSION: 1. No acute intracranial abnormality. 2. Chronic parietal lobe atrophy with suspected cortical encephalomalacia on the left. 3. Superimposed chronic but progressed since 2015 signal changes suggestive of chronic small vessel disease, including a small number of chronic micro-hemorrhages. Electronically Signed   By: Rexene Edison  Margo Aye M.D.   On: 05/17/2019 17:54   Mr Cervical Spine Wo Contrast  Result Date: 05/17/2019 CLINICAL DATA:  85 year old female with lower extremity weakness. EXAM: MRI CERVICAL SPINE WITHOUT CONTRAST TECHNIQUE: Multiplanar, multisequence MR imaging of the cervical spine was performed. No intravenous contrast was administered. COMPARISON:  Brain MRI reported separately today. Cervical spine CT earlier today. FINDINGS: Alignment: Straightening of cervical lordosis as seen earlier today. Vertebrae: No marrow edema or evidence of acute osseous abnormality. Chronic degenerative endplate marrow signal changes. Cord: Spinal cord signal  is within normal limits at all visualized levels. Posterior Fossa, vertebral arteries, paraspinal tissues: Cervicomedullary junction is within normal limits. Brain findings today reported separately. Preserved major vascular flow voids in the neck. Negative visible neck soft tissues. Disc levels: C2-C3: Disc and posterior element degeneration. No spinal stenosis. Moderate to severe bilateral C3 foraminal stenosis. C3-C4: Disc space loss with circumferential disc osteophyte complex and moderate posterior element hypertrophy greater on the left. Mild spinal stenosis, mild if any cord mass effect. Severe bilateral C4 foraminal stenosis. C4-C5: Right eccentric circumferential disc osteophyte complex. Mild facet hypertrophy. No spinal stenosis. Mild to moderate left and severe right C5 foraminal stenosis. C5-C6: Mostly far lateral and anterior circumferential disc osteophyte complex with mild facet hypertrophy. No spinal stenosis. Severe right greater than left C6 foraminal stenosis. C6-C7: Severe disc space loss with bulky circumferential disc osteophyte complex. Mild facet hypertrophy. Mild spinal stenosis, no cord mass effect. Moderate to severe left and severe right C7 foraminal stenosis. C7-T1: Circumferential disc bulge with endplate spurring. Mild posterior element hypertrophy. No spinal stenosis. Moderate to severe bilateral C8 foraminal stenosis. T1-T2 degeneration with foraminal but no spinal stenosis. IMPRESSION: 1.  No acute osseous abnormality in the cervical spine. 2. Diffuse cervical spine degeneration. - mild spinal stenosis at C3-C4 and C6-C7 with up to mild spinal cord mass effect, but no cord signal abnormality. - diffuse moderate and severe bilateral cervical neural foraminal stenosis. Electronically Signed   By: Odessa Fleming M.D.   On: 05/17/2019 18:00      Subjective: "I see a whole lot of rain."  Discharge Exam: Vitals:   05/20/19 2341 05/21/19 0406  BP: (!) 123/55 (!) 128/55  Pulse: 71 67   Resp: 15 16  Temp: 97.8 F (36.6 C) 98.1 F (36.7 C)  SpO2: 98% 98%   Vitals:   05/20/19 1601 05/20/19 2013 05/20/19 2341 05/21/19 0406  BP: 126/68 (!) 137/93 (!) 123/55 (!) 128/55  Pulse: 77 73 71 67  Resp: Temp: 98.8 F (37.1 C) 98.8 F (37.1 C) 97.8 F (36.6 C) 98.1 F (36.7 C)  TempSrc: Oral Oral Oral Oral  SpO2: 99% 94% 98% 98%  Weight:      Height:        General: 85 y.o. female resting in bed in NAD Cardiovascular: RRR, +S1, S2, no m/g/r, equal pulses throughout Respiratory: CTABL, no w/r/r, normal WOB GI: BS+, NDNT, no masses noted, no organomegaly noted MSK: No e/c/c Neuro: Alert to name, follows commands Psyc: Appropriate interaction and affect, calm/cooperative     The results of significant diagnostics from this hospitalization (including imaging, microbiology, ancillary and laboratory) are listed below for reference.     Microbiology: Recent Results (from the past 240 hour(s))  Culture, Urine     Status: Abnormal   Collection Time: 05/17/19  7:19 PM   Specimen: Urine, Random  Result Value Ref Range Status   Specimen Description URINE, RANDOM  Final  Special Requests   Final    ADDED 2124 Performed at [facility], [place] [address], [place], [place] [address]    Culture MULTIPLE SPECIES PRESENT, SUGGEST RECOLLECTION (A)  Final   Report Status 05/19/2019 FINAL  Final  SARS CORONAVIRUS 2 (TAT 6-24 HRS) Nasopharyngeal Nasopharyngeal Swab     Status: None   Collection Time: 05/17/19  8:32 PM   Specimen: Nasopharyngeal Swab  Result Value Ref Range Status   SARS Coronavirus 2 NEGATIVE NEGATIVE Final    Comment: (NOTE) SARS-CoV-2 target nucleic acids are NOT DETECTED. The SARS-CoV-2 RNA is generally detectable in upper and lower respiratory specimens during the acute phase of infection. Negative results do not preclude SARS-CoV-2 infection, do not rule out co-infections with other pathogens, and should not be used as the sole basis  for treatment or other patient management decisions. Negative results must be combined with clinical observations, patient history, and epidemiological information. The expected result is Negative. Fact Sheet for Patients: SugarRoll.be Fact Sheet for Healthcare Providers: https://www.woods-mathews.com/ This test is not yet approved or cleared by the [place] FDA and  has been authorized for detection and/or diagnosis of SARS-CoV-2 by FDA under an Emergency Use Authorization (EUA). This EUA will remain  in effect (meaning this test can be used) for the duration of the COVID-19 declaration under Section 56 4(b)(1) of the Act, 21 U.S.C. section 360bbb-3(b)(1), unless the authorization is terminated or revoked sooner. Performed at [facility], [place] [address], [place], [place] [address]      Labs: BNP (last 3 results) No results for input(s): BNP in the last 8760 hours. Basic Metabolic Panel: Recent Labs  Lab 05/17/19 1151 05/18/19 0351 05/19/19 0310  NA 143 142 142  K 4.3 2.9* 3.9  CL 107 111 110  CO2 24 23 24   GLUCOSE 248* 231* 196*  BUN 75* 36* 19  CREATININE 1.25* 0.74 0.59  CALCIUM 9.0 7.7* 8.0*  MG  --  2.0  --    Liver Function Tests: Recent Labs  Lab 05/17/19 1151 05/18/19 0351  AST 79* 45*  ALT 68* 50*  ALKPHOS 101 75  BILITOT 1.6* 0.9  PROT 7.0 5.3*  ALBUMIN 3.2* 2.4*   No results for input(s): LIPASE, AMYLASE in the last 168 hours. No results for input(s): AMMONIA in the last 168 hours. CBC: Recent Labs  Lab 05/17/19 1151 05/18/19 0351  WBC 12.2* 9.3  NEUTROABS 9.6*  --   HGB 15.5* 12.6  HCT 48.7* 39.1  MCV 79.6* 78.8*  PLT 242 178   Cardiac Enzymes: Recent Labs  Lab 05/17/19 1151 05/18/19 0351 05/19/19 0310  CKTOTAL 1,623* 854* 308*   BNP: Invalid input(s): POCBNP CBG: Recent Labs  Lab 05/20/19 0617 05/20/19 1221 05/20/19 1632 05/20/19 2136 05/21/19 0619  GLUCAP 150* 140*  237* 267* 172*   D-Dimer No results for input(s): DDIMER in the last 72 hours. Hgb A1c No results for input(s): HGBA1C in the last 72 hours. Lipid Profile No results for input(s): CHOL, HDL, LDLCALC, TRIG, CHOLHDL, LDLDIRECT in the last 72 hours. Thyroid function studies No results for input(s): TSH, T4TOTAL, T3FREE, THYROIDAB in the last 72 hours.  Invalid input(s): FREET3 Anemia work up No results for input(s): VITAMINB12, FOLATE, FERRITIN, TIBC, IRON, RETICCTPCT in the last 72 hours. Urinalysis    Component Value Date/Time   COLORURINE YELLOW 05/17/2019 2045   APPEARANCEUR CLOUDY (A) 05/17/2019 2045   LABSPEC 1.010 05/17/2019 2045   PHURINE 7.0 05/17/2019 2045  GLUCOSEU >=500 (A) 05/17/2019 2045   HGBUR MODERATE (A) 05/17/2019 2045   BILIRUBINUR NEGATIVE 05/17/2019 2045   KETONESUR NEGATIVE 05/17/2019 2045   PROTEINUR NEGATIVE 05/17/2019 2045   UROBILINOGEN 0.2 06/22/2014 1409   NITRITE NEGATIVE 05/17/2019 2045   LEUKOCYTESUR LARGE (A) 05/17/2019 2045   Sepsis Labs Invalid input(s): PROCALCITONIN,  WBC,  LACTICIDVEN Microbiology Recent Results (from the past 240 hour(s))  Culture, Urine     Status: Abnormal   Collection Time: 05/17/19  7:19 PM   Specimen: Urine, Random  Result Value Ref Range Status   Specimen Description URINE, RANDOM  Final   Special Requests   Final    ADDED 2124 Performed at [facility], [address] [place], [place] [address]    Culture MULTIPLE SPECIES PRESENT, SUGGEST RECOLLECTION (A)  Final   Report Status 05/19/2019 FINAL  Final  SARS CORONAVIRUS 2 (TAT 6-24 HRS) Nasopharyngeal Nasopharyngeal Swab     Status: None   Collection Time: 05/17/19  8:32 PM   Specimen: Nasopharyngeal Swab  Result Value Ref Range Status   SARS Coronavirus 2 NEGATIVE NEGATIVE Final    Comment: (NOTE) SARS-CoV-2 target nucleic acids are NOT DETECTED. The SARS-CoV-2 RNA is generally detectable in upper and lower respiratory specimens during the acute  phase of infection. Negative results do not preclude SARS-CoV-2 infection, do not rule out co-infections with other pathogens, and should not be used as the sole basis for treatment or other patient management decisions. Negative results must be combined with clinical observations, patient history, and epidemiological information. The expected result is Negative. Fact Sheet for Patients: HairSlick.no Fact Sheet for Healthcare Providers: quierodirigir.com This test is not yet approved or cleared by the [place] FDA and  has been authorized for detection and/or diagnosis of SARS-CoV-2 by FDA under an Emergency Use Authorization (EUA). This EUA will remain  in effect (meaning this test can be used) for the duration of the COVID-19 declaration under Section 56 4(b)(1) of the Act, 21 U.S.C. section 360bbb-3(b)(1), unless the authorization is terminated or revoked sooner. Performed at [facility], [address], [place], [place] [address]      Time coordinating discharge: 35 minutes  SIGNED:   Teddy Spike, DO  [facility] 05/21/2019, 7:02 AM Pager   If 7PM-7AM, please contact night-coverage [email or web address] Password [email or web address]

## 2019-05-21 NOTE — TOC Transition Note (Signed)
Transition of Care [facility]) - CM/SW Discharge Note   Patient Details  Name: Julia [name] MRN: [id number] Date of Birth: [birth year]  [facility] CM/SW Contact:  Pollie Friar, RN Phone Number: 05/21/2019, 10:14 AM   Clinical Narrative:    Pt is discharging to [facility] today. CM updated the patient and called her son Christy Sartorius with the information. CM also attempted to reach her sister without success but Christy Sartorius stated he would let her know.  Pt to transport via [facility]. Bedside RN updated and d/c packet at the desk.   Room: 603P Number for report: [phone]   Final next level of care: Skilled Nursing Facility Barriers to Discharge: No Barriers Identified   Patient Goals and CMS Choice Patient states their goals for this hospitalization and ongoing recovery are:: To get back to doing things for herself like she was before CMS Medicare.gov Compare Post Acute Care list provided to:: Patient Choice offered to / list presented to : Patient  Discharge Placement              Patient chooses bed at: [facility] Patient to be transferred to facility by: [place] Name of family member notified: Victor--son Patient and family notified of of transfer: 05/21/19  Discharge Plan and Services In-house Referral: Clinical Social Work Discharge Planning Services: [employer] Consult Post Acute Care Choice: [place]                               Social Determinants of Health (SDOH) Interventions     Readmission Risk Interventions No flowsheet data found.

## 2020-01-27 ENCOUNTER — Other Ambulatory Visit: Payer: Self-pay | Admitting: Internal Medicine

## 2020-01-27 DIAGNOSIS — Z1231 Encounter for screening mammogram for malignant neoplasm of breast: Secondary | ICD-10-CM

## 2020-03-07 ENCOUNTER — Ambulatory Visit: Payer: No Typology Code available for payment source

## 2020-03-29 ENCOUNTER — Ambulatory Visit: Payer: No Typology Code available for payment source

## 2020-11-02 ENCOUNTER — Ambulatory Visit
Admission: RE | Admit: 2020-11-02 | Discharge: 2020-11-02 | Disposition: A | Discharge status: Home / Self Care | Payer: Medicare Other | Source: Ambulatory Visit | Attending: Internal Medicine | Admitting: Internal Medicine

## 2020-11-02 ENCOUNTER — Other Ambulatory Visit: Payer: Self-pay | Admitting: Internal Medicine

## 2020-11-02 DIAGNOSIS — D869 Sarcoidosis, unspecified: Secondary | ICD-10-CM

## 2020-11-02 DIAGNOSIS — I10 Essential (primary) hypertension: Secondary | ICD-10-CM

## 2020-11-02 DIAGNOSIS — R059 Cough, unspecified: Secondary | ICD-10-CM

## 2020-12-20 ENCOUNTER — Other Ambulatory Visit: Payer: Self-pay | Admitting: Internal Medicine

## 2020-12-20 DIAGNOSIS — Z1231 Encounter for screening mammogram for malignant neoplasm of breast: Secondary | ICD-10-CM

## 2020-12-21 ENCOUNTER — Ambulatory Visit: Payer: No Typology Code available for payment source

## 2021-01-27 ENCOUNTER — Ambulatory Visit (HOSPITAL_COMMUNITY)
Admission: RE | Admit: 2021-01-27 | Discharge: 2021-01-27 | Disposition: A | Discharge status: Home / Self Care | Payer: Medicare Other | Source: Ambulatory Visit | Attending: Internal Medicine | Admitting: Internal Medicine

## 2021-01-27 ENCOUNTER — Other Ambulatory Visit: Payer: Self-pay

## 2021-01-27 ENCOUNTER — Other Ambulatory Visit (HOSPITAL_COMMUNITY): Payer: Self-pay | Admitting: Internal Medicine

## 2021-01-27 DIAGNOSIS — M79605 Pain in left leg: Secondary | ICD-10-CM | POA: Diagnosis present

## 2021-01-30 ENCOUNTER — Other Ambulatory Visit: Payer: Self-pay

## 2021-01-30 ENCOUNTER — Emergency Department (HOSPITAL_COMMUNITY): Payer: Medicare Other

## 2021-01-30 ENCOUNTER — Emergency Department (HOSPITAL_COMMUNITY)
Admission: EM | Admit: 2021-01-30 | Discharge: 2021-01-31 | Disposition: A | Discharge status: Home / Self Care | Payer: Medicare Other | Attending: Emergency Medicine | Admitting: Emergency Medicine

## 2021-01-30 ENCOUNTER — Encounter (HOSPITAL_COMMUNITY): Payer: Self-pay

## 2021-01-30 DIAGNOSIS — Z7984 Long term (current) use of oral hypoglycemic drugs: Secondary | ICD-10-CM | POA: Diagnosis not present

## 2021-01-30 DIAGNOSIS — Z7982 Long term (current) use of aspirin: Secondary | ICD-10-CM | POA: Diagnosis not present

## 2021-01-30 DIAGNOSIS — M545 Low back pain, unspecified: Secondary | ICD-10-CM | POA: Diagnosis not present

## 2021-01-30 DIAGNOSIS — R600 Localized edema: Secondary | ICD-10-CM | POA: Insufficient documentation

## 2021-01-30 DIAGNOSIS — Z79899 Other long term (current) drug therapy: Secondary | ICD-10-CM | POA: Insufficient documentation

## 2021-01-30 DIAGNOSIS — E119 Type 2 diabetes mellitus without complications: Secondary | ICD-10-CM | POA: Diagnosis not present

## 2021-01-30 DIAGNOSIS — I10 Essential (primary) hypertension: Secondary | ICD-10-CM | POA: Diagnosis not present

## 2021-01-30 DIAGNOSIS — M79605 Pain in left leg: Secondary | ICD-10-CM | POA: Diagnosis present

## 2021-01-30 DIAGNOSIS — M5416 Radiculopathy, lumbar region: Secondary | ICD-10-CM

## 2021-01-30 LAB — CBC WITH DIFFERENTIAL/PLATELET
Abs Immature Granulocytes: 0.02 10*3/uL (ref 0.00–0.07)
Basophils Absolute: 0.1 10*3/uL (ref 0.0–0.1)
Basophils Relative: 1 %
Eosinophils Absolute: 0.1 10*3/uL (ref 0.0–0.5)
Eosinophils Relative: 1 %
HCT: 46.3 % — ABNORMAL HIGH (ref 36.0–46.0)
Hemoglobin: 14.2 g/dL (ref 12.0–15.0)
Immature Granulocytes: 0 %
Lymphocytes Relative: 20 %
Lymphs Abs: 1.7 10*3/uL (ref 0.7–4.0)
MCH: 22.3 pg — ABNORMAL LOW (ref 26.0–34.0)
MCHC: 30.7 g/dL (ref 30.0–36.0)
MCV: 72.8 fL — ABNORMAL LOW (ref 80.0–100.0)
Monocytes Absolute: 0.6 10*3/uL (ref 0.1–1.0)
Monocytes Relative: 7 %
Neutro Abs: 6 10*3/uL (ref 1.7–7.7)
Neutrophils Relative %: 71 %
Platelets: 295 10*3/uL (ref 150–400)
RBC: 6.36 MIL/uL — ABNORMAL HIGH (ref 3.87–5.11)
RDW: 19.3 % — ABNORMAL HIGH (ref 11.5–15.5)
WBC: 8.4 10*3/uL (ref 4.0–10.5)
nRBC: 0 % (ref 0.0–0.2)

## 2021-01-30 LAB — COMPREHENSIVE METABOLIC PANEL
ALT: 14 U/L (ref 0–44)
AST: 17 U/L (ref 15–41)
Albumin: 3.8 g/dL (ref 3.5–5.0)
Alkaline Phosphatase: 118 U/L (ref 38–126)
Anion gap: 10 (ref 5–15)
BUN: 11 mg/dL (ref 8–23)
CO2: 25 mmol/L (ref 22–32)
Calcium: 9.6 mg/dL (ref 8.9–10.3)
Chloride: 105 mmol/L (ref 98–111)
Creatinine, Ser: 0.58 mg/dL (ref 0.44–1.00)
GFR, Estimated: >60 mL/min (ref >60)
Glucose, Bld: 226 mg/dL — ABNORMAL HIGH (ref 70–99)
Potassium: 3.9 mmol/L (ref 3.5–5.1)
Sodium: 140 mmol/L (ref 135–145)
Total Bilirubin: 0.4 mg/dL (ref 0.3–1.2)
Total Protein: 7.8 g/dL (ref 6.5–8.1)

## 2021-01-30 LAB — BRAIN NATRIURETIC PEPTIDE: B Natriuretic Peptide: 34 pg/mL (ref 0.0–100.0)

## 2021-01-30 LAB — MAGNESIUM: Magnesium: 1.8 mg/dL (ref 1.7–2.4)

## 2021-01-30 MED ORDER — LIDOCAINE 5 % EX PTCH: 1.0000 | MEDICATED_PATCH | CUTANEOUS | 0 refills | Status: AC

## 2021-01-30 NOTE — Discharge Instructions (Addendum)
You likely have a pinched nerve in your back.  You can continue Tylenol for pain  You can also use lidocaine patch to your back for pain  Contact Dr. Jordan Likes for follow-up  Return to ER if you have worse back pain, leg pain, numbness, weakness

## 2021-01-30 NOTE — ED Provider Notes (Signed)
Emergency Medicine Provider Triage Evaluation Note  Julia [name] , a 87 y.o. female  was evaluated in triage.  Pt complains of L leg pain. Has been intermittent for 1 wk. Had recent negative DVT study.   Review of Systems  Positive: L leg pain Negative: numbness  Physical Exam  BP (!) 142/70   Pulse 87   Temp 99.1 F (37.3 C) (Oral)   Resp 14   SpO2 98%  Gen:   Awake, no distress   Resp:  Normal effort  MSK:   Moves extremities without difficulty. Edema bilaterally. No ttp   Medical Decision Making  Medically screening exam initiated at 9:54 AM.  Appropriate orders placed.  Jonelle Sidle was informed that the remainder of the evaluation will be completed by another provider, this initial triage assessment does not replace that evaluation, and the importance of remaining in the ED until their evaluation is complete.  Labs. Recent negative [place]   Alveria Apley, PA-C 01/30/21 1002    Mancel Bale, MD 01/31/21 1800

## 2021-01-30 NOTE — ED Provider Notes (Signed)
[facility] EMERGENCY DEPARTMENT Provider Note   CSN: [id number] Arrival date & time: 01/30/21  0940     History No chief complaint on file.   Julia [name] is a 87 y.o. female hx of cerebral palsy, HTN, diabetes here presenting with left leg pain.  Patient has been having little intermittent left leg pain for the last week or so.  Patient by her doctor and has a negative DVT study.  However she has persistent left leg pain and her doctor sent her here for CT of the spine. Patient states that the pain is occasionally sharp and shoots down the left leg. Patient does not walk at baseline from cerebral palsy.  Patient denies worsening weakness.  Patient denies any trouble speaking or arm numbness or weakness.  The history is provided by the patient.      Past Medical History:  Diagnosis Date   Cerebral palsy (HCC)    Dizziness and giddiness    Essential hypertension, malignant    GERD (gastroesophageal reflux disease)    Hypokalemia    Kidney stones    Sarcoidosis    Type II or unspecified type diabetes mellitus without mention of complication, not stated as uncontrolled    Unspecified vitamin D deficiency     Patient Active Problem List   Diagnosis Date Noted   UTI (urinary tract infection) 05/18/2019   Rhabdomyolysis 05/17/2019   Cerebral palsy (HCC)    Renal insufficiency    Elevated transaminase level    Retained lens material following cataract surgery, left eye 11/29/2015   Aphakia, left eye 11/29/2015   Retained lens material following cataract surgery of left eye 11/29/2015   Facial numbness 06/22/2014   Stroke, embolic (HCC) 06/22/2014   TIA (transient ischemic attack) 06/22/2014   HTN (hypertension) 06/22/2014   DM type 2 without retinopathy (HCC) 06/22/2014   Left jaw numbness 06/22/2014   Cystic fibrosis (HCC) 06/22/2014   Stroke [facility])     Past Surgical History:  Procedure Laterality Date   ABDOMINAL HYSTERECTOMY     cerebral palsy  surgeries     CESAREAN SECTION     EYE SURGERY     GAS/FLUID EXCHANGE Left 11/29/2015   Procedure: GAS/FLUID EXCHANGE;  Surgeon: Sherrie George, MD;  Location: [facility] OR;  Service: Ophthalmology;  Laterality: Left;   LASER PHOTO ABLATION Left 11/29/2015   Procedure: LASER PHOTO ABLATION LEFT EYE;  Surgeon: Sherrie George, MD;  Location: [facility] OR;  Service: Ophthalmology;  Laterality: Left;   LUNG REMOVAL, PARTIAL     MULTIPLE TOOTH EXTRACTIONS     PARS PLANA VITRECTOMY Left 11/29/2015   WITH 25G REMOVAL/SUTURE INTRAOCULAR LENS LEFT EYE   PARS PLANA VITRECTOMY Left 11/29/2015   Procedure: PARS PLANA VITRECTOMY WITH 25G REMOVAL/SUTURE INTRAOCULAR LENS LEFT EYE;  Surgeon: Sherrie George, MD;  Location: [facility] OR;  Service: Ophthalmology;  Laterality: Left;   TONSILLECTOMY       OB History   No obstetric history on file.     Family History  Problem Relation Age of Onset   Hypertension Mother    Stroke Mother    Cancer Father     Social History   Tobacco Use   Smoking status: Never   Smokeless tobacco: Never  Vaping Use   Vaping Use: Never used  Substance Use Topics   Alcohol use: No   Drug use: No    Home Medications Prior to Admission medications   Medication Sig Start Date End Date  Taking? Authorizing Provider  ALPHAGAN P 0.1 % SOLN Place 1 drop into both eyes 2 (two) times daily.  06/08/14   [provider]  amLODipine (NORVASC) 10 MG tablet Take 10 mg by mouth daily.      [provider]  aspirin EC 81 MG tablet Take 81 mg by mouth daily.    [provider]  cholecalciferol (VITAMIN D) 1000 units tablet Take 1,000 Units by mouth daily.    [provider]  dorzolamide-timolol (COSOPT) 22.3-6.8 MG/ML ophthalmic solution Place 1 drop into both eyes 2 (two) times daily.    [provider]  gatifloxacin (ZYMAXID) 0.5 % SOLN Place 1 drop into the left eye 4 (four) times daily. 11/30/15   Sherrie George, MD  glipiZIDE (GLUCOTROL XL) 5 MG 24  hr tablet Take 5 mg by mouth daily. 11/07/15   [provider]  meclizine (ANTIVERT) 12.5 MG tablet Take 12.5 mg by mouth 4 (four) times daily as needed. Nerve 09/22/17   [provider]  metFORMIN (GLUCOPHAGE-XR) 500 MG 24 hr tablet Take 500 mg by mouth daily. 06/07/14   [provider]  Multiple Vitamin (MULTIVITAMIN) tablet Take 1 tablet by mouth daily.      [provider]  omeprazole (PRILOSEC) 20 MG capsule Take 20 mg by mouth 2 (two) times daily as needed (For heartburn or acid reflux.).  06/07/14   [provider]  TRAVATAN Z 0.004 % SOLN ophthalmic solution Place 1 drop into both eyes at bedtime.  06/07/14   [provider]    Allergies    Penicillins  Review of Systems   Review of Systems  Musculoskeletal:  Positive for back pain.  All other systems reviewed and are negative.  Physical Exam Updated Vital Signs BP (!) 153/72   Pulse 75   Temp 98.4 F (36.9 C) (Oral)   Resp 18   SpO2 98%   Physical Exam Vitals and nursing note reviewed.  Constitutional:      Comments: Chronically ill   HENT:     Head: Normocephalic.     Nose: Nose normal.     Mouth/Throat:     Mouth: Mucous membranes are moist.  Eyes:     Extraocular Movements: Extraocular movements intact.     Pupils: Pupils are equal, round, and reactive to light.  Cardiovascular:     Rate and Rhythm: Normal rate and regular rhythm.     Pulses: Normal pulses.  Pulmonary:     Effort: Pulmonary effort is normal.     Breath sounds: Normal breath sounds.  Abdominal:     General: Abdomen is flat.     Palpations: Abdomen is soft.  Musculoskeletal:     Cervical back: Normal range of motion and neck supple.     Comments: L paralumbar tenderness, + straight leg raise L side.   Skin:    General: Skin is warm.     Capillary Refill: Capillary refill takes less than 2 seconds.  Neurological:     Comments: Patient has normal reflexes bilaterally.  Patient has positive  straight leg raise on the left.  Patient has contracted legs consistent with chronic cerebral palsy  Psychiatric:        Mood and Affect: Mood normal.        Behavior: Behavior normal.    ED Results / Procedures / Treatments   Labs (all labs ordered are listed, but only abnormal results are displayed) Labs Reviewed  CBC WITH DIFFERENTIAL/PLATELET - Abnormal;  Notable for the following components:      Result Value   RBC 6.36 (*)    HCT 46.3 (*)    MCV 72.8 (*)    MCH 22.3 (*)    RDW 19.3 (*)    All other components within normal limits  COMPREHENSIVE METABOLIC PANEL - Abnormal; Notable for the following components:   Glucose, Bld 226 (*)    All other components within normal limits  MAGNESIUM  BRAIN NATRIURETIC PEPTIDE    EKG EKG Interpretation  Date/Time:  Monday January 30 2021 10:13:46 EDT Ventricular Rate:  80 PR Interval:  168 QRS Duration: 128 QT Interval:  418 QTC Calculation: 482 R Axis:   -82 Text Interpretation: Normal sinus rhythm Right bundle branch block Left anterior fascicular block bifasicular block Abnormal ECG No significant change since last tracing Confirmed by Richardean Canal [phone]) on 01/30/2021 7:53:46 PM  Radiology CT Lumbar Spine Wo Contrast  Result Date: 01/30/2021 CLINICAL DATA:  Low back pain with progressive neurological deficit. Lumbar radiculopathy, no red flags. EXAM: CT LUMBAR SPINE WITHOUT CONTRAST TECHNIQUE: Multidetector CT imaging of the lumbar spine was performed without intravenous contrast administration. Multiplanar CT image reconstructions were also generated. COMPARISON:  MRI 01/29/2011 FINDINGS: Segmentation: 5 lumbar type vertebral bodies. Alignment: Thoracolumbar curvature convex to the right and lower lumbar curvature convex to the left. Straightening of the normal cervical lordosis. Vertebrae: No fracture or focal bone lesion. Paraspinal and other soft tissues: Multiple calcifications in the lower pole the right kidney, measuring up to  13 mm in size. No sign of renal obstruction. Several other small nonobstructing calculi in both kidneys. Disc levels: T11-12: Desiccation of the disc with vacuum phenomenon. Minimal disc bulge. No stenosis. T12-L1: Desiccation of the disc with vacuum phenomenon. Minimal disc bulge. No stenosis. L1-2: Desiccation of the disc with vacuum phenomenon. Minimal disc bulge. No stenosis. L2-3: Desiccation of the disc with vacuum phenomenon. Minimal disc bulge. No stenosis. L3-4: Desiccation of the disc with vacuum phenomenon. Moderate bulging of the disc more prominent towards the right. Mild facet and ligamentous hypertrophy more prominent on the right. Narrowing of the right lateral recess and intervertebral foramen on the right. L4-5: Disc degeneration with vacuum phenomenon. Moderate bulging of the disc more towards the right. Facet and ligamentous hypertrophy more on the right. Narrowing of the right lateral recess and intervertebral foramen on the right. L5-S1: Mild bulging of the disc. No canal or foraminal stenosis per mild facet osteoarthritis. IMPRESSION: Slight general progression of degenerative disc disease throughout the lower thoracic and lumbar spine since the MRI of 2012. No evidence of fracture or focal bone lesion. Disc degeneration as worsened with further loss of disc height and vacuum phenomenon. No compressive stenosis is seen at L2-3 or above. At L3-4 and L4-5, there is lateral recess and foraminal stenosis on the right that could possibly cause right-sided neural compressive symptoms. Electronically Signed   By: Paulina Fusi M.D.   On: 01/30/2021 20:15    Procedures Procedures   Medications Ordered in ED Medications - No data to display  ED Course  I have reviewed the triage vital signs and the nursing notes.  Pertinent labs & imaging results that were available during my care of the patient were reviewed by me and considered in my medical decision making (see chart for details).     MDM Rules/Calculators/A&P  Julia [name] is a 87 y.o. female here presenting with left leg pain and back pain.  And negative DVT study.  Patient is sent here for CT of the lumbar spine.  Patient has cerebral palsy and does not walk at baseline.  Patient does have positive straight leg raise on the left side.  Plan to get CT lumbar spine and labs.  We will hold off on MRI right now  9:33 PM CT showed spinal stenosis likely causing radiculopathy symptoms.  Patient is 8187 and does not ambulate at baseline.  Patient states that she is not in pain right now.  She states that she does not want any pain medicine.  We will try some lidocaine patch and will refer to neurosurgery for follow-up   Final Clinical Impression(s) / ED Diagnoses Final diagnoses:  None    Rx / DC Orders ED Discharge Orders     None        Charlynne PanderYao, Retina Bernardy Hsienta, MD 01/30/21 2136

## 2021-01-30 NOTE — ED Notes (Signed)
PTAR CALLED 10TH ON LIST

## 2021-01-30 NOTE — ED Triage Notes (Signed)
Patient is having burning to left leg for several days and sent by MD for further evaluation. Also reports that it makes her left leg numb as well. Denies trauma. No neuro deficits. Describes the discomfort as an ache

## 2021-02-10 ENCOUNTER — Inpatient Hospital Stay: Admission: RE | Admit: 2021-02-10 | Payer: No Typology Code available for payment source | Source: Ambulatory Visit

## 2021-06-20 ENCOUNTER — Emergency Department (HOSPITAL_COMMUNITY): Payer: Medicare Other

## 2021-06-20 ENCOUNTER — Inpatient Hospital Stay (HOSPITAL_COMMUNITY)
Admission: EM | Admit: 2021-06-20 | Discharge: 2021-07-09 | DRG: 064 | Disposition: E | Payer: Medicare Other | Attending: Internal Medicine | Admitting: Internal Medicine

## 2021-06-20 ENCOUNTER — Inpatient Hospital Stay (HOSPITAL_COMMUNITY): Payer: Medicare Other

## 2021-06-20 ENCOUNTER — Other Ambulatory Visit: Payer: Self-pay

## 2021-06-20 DIAGNOSIS — Z20822 Contact with and (suspected) exposure to covid-19: Secondary | ICD-10-CM | POA: Diagnosis present

## 2021-06-20 DIAGNOSIS — I169 Hypertensive crisis, unspecified: Secondary | ICD-10-CM | POA: Diagnosis present

## 2021-06-20 DIAGNOSIS — R402431 Glasgow coma scale score 3-8, in the field [EMT or ambulance]: Secondary | ICD-10-CM | POA: Diagnosis not present

## 2021-06-20 DIAGNOSIS — D6489 Other specified anemias: Secondary | ICD-10-CM | POA: Diagnosis not present

## 2021-06-20 DIAGNOSIS — I63411 Cerebral infarction due to embolism of right middle cerebral artery: Principal | ICD-10-CM | POA: Diagnosis present

## 2021-06-20 DIAGNOSIS — G9341 Metabolic encephalopathy: Secondary | ICD-10-CM | POA: Diagnosis present

## 2021-06-20 DIAGNOSIS — S0990XA Unspecified injury of head, initial encounter: Secondary | ICD-10-CM

## 2021-06-20 DIAGNOSIS — R4189 Other symptoms and signs involving cognitive functions and awareness: Secondary | ICD-10-CM | POA: Diagnosis not present

## 2021-06-20 DIAGNOSIS — I6340 Cerebral infarction due to embolism of unspecified cerebral artery: Secondary | ICD-10-CM | POA: Insufficient documentation

## 2021-06-20 DIAGNOSIS — I6310 Cerebral infarction due to embolism of unspecified precerebral artery: Secondary | ICD-10-CM | POA: Diagnosis not present

## 2021-06-20 DIAGNOSIS — Z515 Encounter for palliative care: Secondary | ICD-10-CM | POA: Diagnosis not present

## 2021-06-20 DIAGNOSIS — R4182 Altered mental status, unspecified: Secondary | ICD-10-CM

## 2021-06-20 DIAGNOSIS — E875 Hyperkalemia: Secondary | ICD-10-CM | POA: Diagnosis not present

## 2021-06-20 DIAGNOSIS — E8770 Fluid overload, unspecified: Secondary | ICD-10-CM | POA: Diagnosis not present

## 2021-06-20 DIAGNOSIS — E876 Hypokalemia: Secondary | ICD-10-CM | POA: Diagnosis present

## 2021-06-20 DIAGNOSIS — Z87440 Personal history of urinary (tract) infections: Secondary | ICD-10-CM | POA: Diagnosis not present

## 2021-06-20 DIAGNOSIS — G809 Cerebral palsy, unspecified: Secondary | ICD-10-CM | POA: Diagnosis present

## 2021-06-20 DIAGNOSIS — R403 Persistent vegetative state: Secondary | ICD-10-CM | POA: Diagnosis present

## 2021-06-20 DIAGNOSIS — J9602 Acute respiratory failure with hypercapnia: Secondary | ICD-10-CM | POA: Diagnosis not present

## 2021-06-20 DIAGNOSIS — D72829 Elevated white blood cell count, unspecified: Secondary | ICD-10-CM | POA: Diagnosis not present

## 2021-06-20 DIAGNOSIS — Z79899 Other long term (current) drug therapy: Secondary | ICD-10-CM

## 2021-06-20 DIAGNOSIS — N390 Urinary tract infection, site not specified: Secondary | ICD-10-CM | POA: Diagnosis present

## 2021-06-20 DIAGNOSIS — R29731 NIHSS score 31: Secondary | ICD-10-CM | POA: Diagnosis present

## 2021-06-20 DIAGNOSIS — I63311 Cerebral infarction due to thrombosis of right middle cerebral artery: Secondary | ICD-10-CM | POA: Diagnosis not present

## 2021-06-20 DIAGNOSIS — E870 Hyperosmolality and hypernatremia: Secondary | ICD-10-CM | POA: Diagnosis present

## 2021-06-20 DIAGNOSIS — J9601 Acute respiratory failure with hypoxia: Secondary | ICD-10-CM | POA: Diagnosis present

## 2021-06-20 DIAGNOSIS — Z66 Do not resuscitate: Secondary | ICD-10-CM | POA: Diagnosis not present

## 2021-06-20 DIAGNOSIS — Z8249 Family history of ischemic heart disease and other diseases of the circulatory system: Secondary | ICD-10-CM

## 2021-06-20 DIAGNOSIS — E872 Acidosis, unspecified: Secondary | ICD-10-CM | POA: Diagnosis present

## 2021-06-20 DIAGNOSIS — Z7982 Long term (current) use of aspirin: Secondary | ICD-10-CM

## 2021-06-20 DIAGNOSIS — E1165 Type 2 diabetes mellitus with hyperglycemia: Secondary | ICD-10-CM | POA: Diagnosis present

## 2021-06-20 DIAGNOSIS — E440 Moderate protein-calorie malnutrition: Secondary | ICD-10-CM | POA: Diagnosis present

## 2021-06-20 DIAGNOSIS — G928 Other toxic encephalopathy: Secondary | ICD-10-CM | POA: Diagnosis not present

## 2021-06-20 DIAGNOSIS — I10 Essential (primary) hypertension: Secondary | ICD-10-CM | POA: Diagnosis not present

## 2021-06-20 DIAGNOSIS — R4020 Unspecified coma: Secondary | ICD-10-CM | POA: Diagnosis present

## 2021-06-20 DIAGNOSIS — E11641 Type 2 diabetes mellitus with hypoglycemia with coma: Secondary | ICD-10-CM | POA: Diagnosis not present

## 2021-06-20 DIAGNOSIS — J690 Pneumonitis due to inhalation of food and vomit: Secondary | ICD-10-CM | POA: Diagnosis present

## 2021-06-20 DIAGNOSIS — J9690 Respiratory failure, unspecified, unspecified whether with hypoxia or hypercapnia: Secondary | ICD-10-CM | POA: Diagnosis not present

## 2021-06-20 DIAGNOSIS — J988 Other specified respiratory disorders: Secondary | ICD-10-CM | POA: Diagnosis not present

## 2021-06-20 DIAGNOSIS — D696 Thrombocytopenia, unspecified: Secondary | ICD-10-CM | POA: Diagnosis not present

## 2021-06-20 DIAGNOSIS — I6389 Other cerebral infarction: Secondary | ICD-10-CM | POA: Diagnosis not present

## 2021-06-20 DIAGNOSIS — Z4659 Encounter for fitting and adjustment of other gastrointestinal appliance and device: Secondary | ICD-10-CM

## 2021-06-20 DIAGNOSIS — E119 Type 2 diabetes mellitus without complications: Secondary | ICD-10-CM | POA: Diagnosis not present

## 2021-06-20 DIAGNOSIS — Z6822 Body mass index (BMI) 22.0-22.9, adult: Secondary | ICD-10-CM

## 2021-06-20 DIAGNOSIS — Z9289 Personal history of other medical treatment: Secondary | ICD-10-CM

## 2021-06-20 DIAGNOSIS — I161 Hypertensive emergency: Secondary | ICD-10-CM | POA: Diagnosis present

## 2021-06-20 DIAGNOSIS — R569 Unspecified convulsions: Secondary | ICD-10-CM | POA: Diagnosis not present

## 2021-06-20 DIAGNOSIS — H409 Unspecified glaucoma: Secondary | ICD-10-CM | POA: Diagnosis present

## 2021-06-20 DIAGNOSIS — I959 Hypotension, unspecified: Secondary | ICD-10-CM | POA: Diagnosis present

## 2021-06-20 DIAGNOSIS — D869 Sarcoidosis, unspecified: Secondary | ICD-10-CM | POA: Diagnosis present

## 2021-06-20 DIAGNOSIS — K219 Gastro-esophageal reflux disease without esophagitis: Secondary | ICD-10-CM | POA: Diagnosis present

## 2021-06-20 DIAGNOSIS — T1490XA Injury, unspecified, initial encounter: Secondary | ICD-10-CM

## 2021-06-20 DIAGNOSIS — I639 Cerebral infarction, unspecified: Secondary | ICD-10-CM | POA: Diagnosis not present

## 2021-06-20 DIAGNOSIS — Z823 Family history of stroke: Secondary | ICD-10-CM

## 2021-06-20 DIAGNOSIS — Z8673 Personal history of transient ischemic attack (TIA), and cerebral infarction without residual deficits: Secondary | ICD-10-CM

## 2021-06-20 LAB — CBC
HCT: 47.2 % — ABNORMAL HIGH (ref 36.0–46.0)
Hemoglobin: 14.4 g/dL (ref 12.0–15.0)
MCH: 22.9 pg — ABNORMAL LOW (ref 26.0–34.0)
MCHC: 30.5 g/dL (ref 30.0–36.0)
MCV: 75 fL — ABNORMAL LOW (ref 80.0–100.0)
Platelets: 290 10*3/uL (ref 150–400)
RBC: 6.29 MIL/uL — ABNORMAL HIGH (ref 3.87–5.11)
RDW: 17.3 % — ABNORMAL HIGH (ref 11.5–15.5)
WBC: 17.8 10*3/uL — ABNORMAL HIGH (ref 4.0–10.5)
nRBC: 0.1 % (ref 0.0–0.2)

## 2021-06-20 LAB — BASIC METABOLIC PANEL
Anion gap: 9 (ref 5–15)
Anion gap: 6 (ref 5–15)
BUN: 16 mg/dL (ref 8–23)
BUN: 18 mg/dL (ref 8–23)
CO2: 20 mmol/L — ABNORMAL LOW (ref 22–32)
CO2: 22 mmol/L (ref 22–32)
Calcium: 9.4 mg/dL (ref 8.9–10.3)
Calcium: 9.3 mg/dL (ref 8.9–10.3)
Chloride: 115 mmol/L — ABNORMAL HIGH (ref 98–111)
Chloride: 118 mmol/L — ABNORMAL HIGH (ref 98–111)
Creatinine, Ser: 0.74 mg/dL (ref 0.44–1.00)
Creatinine, Ser: 0.9 mg/dL (ref 0.44–1.00)
GFR, Estimated: >60 mL/min (ref >60)
GFR, Estimated: >60 mL/min (ref >60)
Glucose, Bld: 322 mg/dL — ABNORMAL HIGH (ref 70–99)
Glucose, Bld: 166 mg/dL — ABNORMAL HIGH (ref 70–99)
Potassium: 6.9 mmol/L (ref 3.5–5.1)
Potassium: 5 mmol/L (ref 3.5–5.1)
Sodium: 144 mmol/L (ref 135–145)
Sodium: 146 mmol/L — ABNORMAL HIGH (ref 135–145)

## 2021-06-20 LAB — I-STAT ARTERIAL BLOOD GAS, ED
Acid-base deficit: 1 mmol/L (ref 0.0–2.0)
Bicarbonate: 22.1 mmol/L (ref 20.0–28.0)
Calcium, Ion: 1.16 mmol/L (ref 1.15–1.40)
HCT: 44 % (ref 36.0–46.0)
Hemoglobin: 15 g/dL (ref 12.0–15.0)
O2 Saturation: 100 %
Patient temperature: 98.7
Potassium: 2.6 mmol/L — CL (ref 3.5–5.1)
Sodium: 145 mmol/L (ref 135–145)
TCO2: 23 mmol/L (ref 22–32)
pCO2 arterial: 31.8 mmHg — ABNORMAL LOW (ref 32.0–48.0)
pH, Arterial: 7.451 — ABNORMAL HIGH (ref 7.350–7.450)
pO2, Arterial: 539 mmHg — ABNORMAL HIGH (ref 83.0–108.0)

## 2021-06-20 LAB — CBG MONITORING, ED: Glucose-Capillary: 386 mg/dL — ABNORMAL HIGH (ref 70–99)

## 2021-06-20 LAB — COMPREHENSIVE METABOLIC PANEL
ALT: 13 U/L (ref 0–44)
AST: 25 U/L (ref 15–41)
Albumin: 3.1 g/dL — ABNORMAL LOW (ref 3.5–5.0)
Alkaline Phosphatase: 95 U/L (ref 38–126)
Anion gap: 18 — ABNORMAL HIGH (ref 5–15)
BUN: 12 mg/dL (ref 8–23)
CO2: 15 mmol/L — ABNORMAL LOW (ref 22–32)
Calcium: 8 mg/dL — ABNORMAL LOW (ref 8.9–10.3)
Chloride: 112 mmol/L — ABNORMAL HIGH (ref 98–111)
Creatinine, Ser: 0.74 mg/dL (ref 0.44–1.00)
GFR, Estimated: >60 mL/min (ref >60)
Glucose, Bld: 330 mg/dL — ABNORMAL HIGH (ref 70–99)
Potassium: 2.6 mmol/L — CL (ref 3.5–5.1)
Sodium: 145 mmol/L (ref 135–145)
Total Bilirubin: 1.1 mg/dL (ref 0.3–1.2)
Total Protein: 6.6 g/dL (ref 6.5–8.1)

## 2021-06-20 LAB — I-STAT CHEM 8, ED
BUN: 16 mg/dL (ref 8–23)
Calcium, Ion: 0.99 mmol/L — ABNORMAL LOW (ref 1.15–1.40)
Chloride: 109 mmol/L (ref 98–111)
Creatinine, Ser: 0.5 mg/dL (ref 0.44–1.00)
Glucose, Bld: 377 mg/dL — ABNORMAL HIGH (ref 70–99)
HCT: 49 % — ABNORMAL HIGH (ref 36.0–46.0)
Hemoglobin: 16.7 g/dL — ABNORMAL HIGH (ref 12.0–15.0)
Potassium: 3 mmol/L — ABNORMAL LOW (ref 3.5–5.1)
Sodium: 147 mmol/L — ABNORMAL HIGH (ref 135–145)
TCO2: 20 mmol/L — ABNORMAL LOW (ref 22–32)

## 2021-06-20 LAB — URINALYSIS, MICROSCOPIC (REFLEX)

## 2021-06-20 LAB — GLUCOSE, CAPILLARY
Glucose-Capillary: 251 mg/dL — ABNORMAL HIGH (ref 70–99)
Glucose-Capillary: 243 mg/dL — ABNORMAL HIGH (ref 70–99)
Glucose-Capillary: 218 mg/dL — ABNORMAL HIGH (ref 70–99)
Glucose-Capillary: 230 mg/dL — ABNORMAL HIGH (ref 70–99)

## 2021-06-20 LAB — RESP PANEL BY RT-PCR (FLU A&B, COVID) ARPGX2
Influenza A by PCR: NEGATIVE
Influenza B by PCR: NEGATIVE
SARS Coronavirus 2 by RT PCR: NEGATIVE

## 2021-06-20 LAB — URINALYSIS, ROUTINE W REFLEX MICROSCOPIC
Bilirubin Urine: NEGATIVE
Glucose, UA: >=500 mg/dL — AB
Ketones, ur: >80 mg/dL — AB
Nitrite: NEGATIVE
Protein, ur: 30 mg/dL — AB
Specific Gravity, Urine: 1.015 (ref 1.005–1.030)
pH: 6 (ref 5.0–8.0)

## 2021-06-20 LAB — ETHANOL: Alcohol, Ethyl (B): <10 mg/dL (ref <10)

## 2021-06-20 LAB — LACTIC ACID, PLASMA
Lactic Acid, Venous: 3.3 mmol/L (ref 0.5–1.9)
Lactic Acid, Venous: 2.4 mmol/L (ref 0.5–1.9)
Lactic Acid, Venous: 2.2 mmol/L (ref 0.5–1.9)

## 2021-06-20 LAB — HEMOGLOBIN A1C
Hgb A1c MFr Bld: 8 % — ABNORMAL HIGH (ref 4.8–5.6)
Mean Plasma Glucose: 182.9 mg/dL

## 2021-06-20 LAB — SAMPLE TO BLOOD BANK

## 2021-06-20 LAB — MRSA NEXT GEN BY PCR, NASAL: MRSA by PCR Next Gen: DETECTED — AB

## 2021-06-20 LAB — PROTIME-INR
INR: 1.1 (ref 0.8–1.2)
Prothrombin Time: 13.7 seconds (ref 11.4–15.2)

## 2021-06-20 MED ORDER — POTASSIUM CHLORIDE 20 MEQ PO PACK
40.0000 meq | PACK | ORAL | Status: AC
  Administered 2021-06-20 (×2): 40 meq
  Filled 2021-06-20 (×2): qty 2

## 2021-06-20 MED ORDER — ORAL CARE MOUTH RINSE
15.0000 mL | OROMUCOSAL | Status: DC
  Administered 2021-06-20 – 2021-06-29 (×87): 15 mL via OROMUCOSAL

## 2021-06-20 MED ORDER — DEXTROSE IN LACTATED RINGERS 5 % IV SOLN: INTRAVENOUS | Status: DC

## 2021-06-20 MED ORDER — DEXMEDETOMIDINE HCL IN NACL 400 MCG/100ML IV SOLN
0.4000 ug/kg/h | INTRAVENOUS | Status: DC
Start: 2021-06-20 — End: 2021-06-20

## 2021-06-20 MED ORDER — FENTANYL BOLUS VIA INFUSION
25.0000 ug | INTRAVENOUS | Status: DC | PRN
  Filled 2021-06-20: qty 100

## 2021-06-20 MED ORDER — POLYETHYLENE GLYCOL 3350 17 G PO PACK
17.0000 g | PACK | Freq: Every day | ORAL | Status: DC | PRN
  Administered 2021-06-23: 17 g
  Filled 2021-06-20: qty 1

## 2021-06-20 MED ORDER — INSULIN REGULAR(HUMAN) IN NACL 100-0.9 UT/100ML-% IV SOLN
INTRAVENOUS | Status: DC
  Administered 2021-06-20: 8.5 [IU]/h via INTRAVENOUS
  Filled 2021-06-20: qty 100

## 2021-06-20 MED ORDER — ONDANSETRON HCL 4 MG/2ML IJ SOLN: 4.0000 mg | Freq: Four times a day (QID) | INTRAMUSCULAR | Status: DC | PRN

## 2021-06-20 MED ORDER — POTASSIUM CHLORIDE 20 MEQ PO PACK: 40.0000 meq | PACK | Freq: Once | ORAL | Status: DC

## 2021-06-20 MED ORDER — PROPOFOL 1000 MG/100ML IV EMUL
INTRAVENOUS | Status: AC
  Filled 2021-06-20: qty 100

## 2021-06-20 MED ORDER — PANTOPRAZOLE SODIUM 40 MG IV SOLR
40.0000 mg | Freq: Every day | INTRAVENOUS | Status: DC
  Administered 2021-06-20: 40 mg via INTRAVENOUS
  Filled 2021-06-20: qty 40

## 2021-06-20 MED ORDER — LACTATED RINGERS IV SOLN: INTRAVENOUS | Status: DC

## 2021-06-20 MED ORDER — CLEVIDIPINE BUTYRATE 0.5 MG/ML IV EMUL
0.0000 mg/h | INTRAVENOUS | Status: DC
  Administered 2021-06-20: 1 mg/h via INTRAVENOUS
  Filled 2021-06-20: qty 50

## 2021-06-20 MED ORDER — DEXTROSE 50 % IV SOLN: 0.0000 mL | INTRAVENOUS | Status: DC | PRN

## 2021-06-20 MED ORDER — PROPOFOL 1000 MG/100ML IV EMUL
0.0000 ug/kg/min | INTRAVENOUS | Status: DC
Start: 2021-06-20 — End: 2021-06-25
  Administered 2021-06-20: 25 ug/kg/min via INTRAVENOUS
  Administered 2021-06-20 – 2021-06-21 (×2): 20 ug/kg/min via INTRAVENOUS
  Administered 2021-06-21: 19:00:00 30 ug/kg/min via INTRAVENOUS
  Administered 2021-06-22: 35 ug/kg/min via INTRAVENOUS
  Administered 2021-06-22 – 2021-06-23 (×3): 30 ug/kg/min via INTRAVENOUS
  Administered 2021-06-23: 10 ug/kg/min via INTRAVENOUS
  Filled 2021-06-20 (×8): qty 100

## 2021-06-20 MED ORDER — SODIUM CHLORIDE 0.9 % IV SOLN
2.0000 g | Freq: Two times a day (BID) | INTRAVENOUS | Status: DC
  Administered 2021-06-20 – 2021-06-21 (×3): 2 g via INTRAVENOUS
  Filled 2021-06-20 (×3): qty 2

## 2021-06-20 MED ORDER — CHLORHEXIDINE GLUCONATE 0.12% ORAL RINSE (MEDLINE KIT)
15.0000 mL | Freq: Two times a day (BID) | OROMUCOSAL | Status: DC
  Administered 2021-06-20 – 2021-06-29 (×18): 15 mL via OROMUCOSAL

## 2021-06-20 MED ORDER — FENTANYL 2500MCG IN NS 250ML (10MCG/ML) PREMIX INFUSION
25.0000 ug/h | INTRAVENOUS | Status: DC
  Administered 2021-06-20: 75 ug/h via INTRAVENOUS

## 2021-06-20 MED ORDER — DOCUSATE SODIUM 50 MG/5ML PO LIQD
100.0000 mg | Freq: Two times a day (BID) | ORAL | Status: DC | PRN
  Administered 2021-06-23: 100 mg
  Filled 2021-06-20: qty 10

## 2021-06-20 MED ORDER — INSULIN ASPART 100 UNIT/ML IJ SOLN: 0.0000 [IU] | INTRAMUSCULAR | Status: DC

## 2021-06-20 MED ORDER — ETOMIDATE 2 MG/ML IV SOLN
INTRAVENOUS | Status: AC | PRN
  Administered 2021-06-20: 20 mg via INTRAVENOUS

## 2021-06-20 MED ORDER — ROCURONIUM BROMIDE 50 MG/5ML IV SOLN
INTRAVENOUS | Status: AC | PRN
  Administered 2021-06-20: 100 mg via INTRAVENOUS

## 2021-06-20 MED ORDER — POTASSIUM CHLORIDE 10 MEQ/50ML IV SOLN
10.0000 meq | INTRAVENOUS | Status: AC
  Administered 2021-06-20 (×4): 10 meq via INTRAVENOUS
  Filled 2021-06-20 (×4): qty 50

## 2021-06-20 MED ORDER — HEPARIN SODIUM (PORCINE) 5000 UNIT/ML IJ SOLN
5000.0000 [IU] | Freq: Three times a day (TID) | INTRAMUSCULAR | Status: DC
  Administered 2021-06-20 – 2021-06-29 (×28): 5000 [IU] via SUBCUTANEOUS
  Filled 2021-06-20 (×28): qty 1

## 2021-06-20 MED ORDER — LABETALOL HCL 5 MG/ML IV SOLN
20.0000 mg | INTRAVENOUS | Status: AC | PRN
  Administered 2021-06-20 – 2021-06-24 (×3): 20 mg via INTRAVENOUS
  Filled 2021-06-20 (×2): qty 4

## 2021-06-20 MED ORDER — FENTANYL CITRATE PF 50 MCG/ML IJ SOSY
25.0000 ug | PREFILLED_SYRINGE | Freq: Once | INTRAMUSCULAR | Status: AC
  Administered 2021-06-20: 25 ug via INTRAVENOUS

## 2021-06-20 MED ORDER — CALCIUM GLUCONATE-NACL 2-0.675 GM/100ML-% IV SOLN
2.0000 g | Freq: Once | INTRAVENOUS | Status: AC
  Administered 2021-06-20: 2000 mg via INTRAVENOUS
  Filled 2021-06-20: qty 100

## 2021-06-20 MED ORDER — PROPOFOL 1000 MG/100ML IV EMUL
INTRAVENOUS | Status: AC | PRN
  Administered 2021-06-20: 25 ug/kg/min via INTRAVENOUS

## 2021-06-20 NOTE — Progress Notes (Signed)
Pt transported from 3M06 to MRI and back with no complications

## 2021-06-20 NOTE — Progress Notes (Signed)
Orthopedic Tech Progress Note Patient Details:  LEKISHA MCGHEE August 10, 1931 [id number]  Level 1 trauma   Patient ID: Jonelle Sidle, female   DOB: [birth year], 87 y.o.   MRN: [id number]  Donald Pore 07-03-21, 10:55 AM

## 2021-06-20 NOTE — Progress Notes (Signed)
Patient in MRI at this time. Will continue to monitor.

## 2021-06-20 NOTE — ED Triage Notes (Signed)
Pt brought via EMS from home where she was found unresponsive, prone, on the floor by family. LSN 0900 yesterday (12/12). Pt with urinary incontinence, CBG 295, RR 34, HR 130, hypertensive, pupils 1 and NR.

## 2021-06-20 NOTE — Procedures (Signed)
Patient Name: Julia [name]  MRN: [id number]  Epilepsy Attending: Charlsie Quest  Referring Physician/Provider: Dr Levon Hedger Date: Jul 14, 2021 Duration: 24.16 mins  Patient history: 87yo F with ams. EEG to evaluate for seizure  Level of alertness: comatose  AEDs during EEG study: Propofol  Technical aspects: This EEG study was done with scalp electrodes positioned according to the 10-20 International system of electrode placement. Electrical activity was acquired at a sampling rate of 500Hz  and reviewed with a high frequency filter of 70Hz  and a low frequency filter of 1Hz . EEG data were recorded continuously and digitally stored.   Description: EEG showed continuous low amplitude 2-3hz  delta slowing in right hemisphere. There was also predominantly 5-8Hz  theta-alpha activity in left hemisphere admixed with intermittent 2-3hz  delta slowing. Hyperventilation and photic stimulation were not performed.     ABNORMALITY - Continuous slow, generalized and lateralized right hemisphere  IMPRESSION: This study is suggestive of cortical dysfunction arising from right hemisphere, nonspecific etiology but likely secondary to underlying structural abnormality, post-ictal state. Additionally there is  moderate diffuse encephalopathy, nonspecific etiology but likely could be secondary to sedation. No seizures or epileptiform discharges were seen throughout the recording.  Dr was notified.  Julia [name] 

## 2021-06-20 NOTE — Progress Notes (Signed)
EEG completed, results pending. 

## 2021-06-20 NOTE — ED Notes (Signed)
Trauma Response Nurse Note-  Reason for Call / Reason for Trauma activation:   - L1 Found down with facial trauma  Initial Focused Assessment (If applicable, or please see trauma documentation):  - GCS 5 - R pupil sluggish pinpoint, L pupil irregular and non-reactive - Hypertensive in the 220's systolic - Tachycardic in the 160'V. - C-Collar in place - 20G PIV L AC - npa placed en route / bagged pt en route.   Interventions:  - Trauma labs drawn - CXR / Pelvic XR - CT pan scan - fentanyl gtt initiated - propofol gtt initiated - 20G PIV started in R AC - RSI meds given (etomidate and roc) - Intubated in trauma A  Plan of Care as of this note:  - All scans negative - CCM to admit to their service and admit to ICU. - GOC with family  Event Summary:   - Pt was LSN by her family at 0900 yesterday (12/12).  Pt found family and grandson was on scene but unable to provide medical hx. Pt unresponsive and questionably posturing in the L upper extremetiy.  Pt localizes to pain on R upper extremity.  Pt was bagged and an NPA placed by EMS.  Intubated on arrival.  RSI drugs given prior to intubation.  Fentanyl and propofol gtt started.  Pt still continued to have hypertension despite efforts to sedate and move BP cuff to a different location to assess accuracy.  EDP notified.  20mg  Labetalol given.  SBP now in the 120's.  Spoke with Dr. at bedside.  L1 activation time: 1012. Pt arrival: 1017. Trauma MD arrival: Dr. Katrinka Blazing @ [phone]. TRN arrival: [facility] @ 1015.  [place] TRN [phone]

## 2021-06-20 NOTE — H&P (Signed)
NAME:  Julia [name], MRN:  [id number], DOB:  02/26/1932, LOS: 0 ADMISSION DATE:  07/03/2021, CONSULTATION DATE:  07/08/2021 REFERRING MD:  Jeraldine Loots, CHIEF COMPLAINT:  Coma   History of Present Illness:  87 year old with hx below (other MRN is [id number]) presenting after being found down at home.  History per chart review as patent is intubated and sedated.  Had some blood on face so called as Level 1 trauma.  Trauma scans are benign.  Intubated on arrival. PCCM consulted for admission.  I tried calling family but no answer.  Pertinent  Medical History  UTI (urinary tract infection) Rhabdomyolysis Cerebral palsy (HCC) Renal insufficiency Elevated transaminase level Retained lens material following cataract surgery, left eye Aphakia, left eye Retained lens material following cataract surgery of left eye Facial numbness Stroke, embolic (HCC) TIA (transient ischemic attack) HTN (hypertension) DM type 2 without retinopathy (HCC) Left jaw numbness  Significant Hospital Events: Including procedures, antibiotic start and stop dates in addition to other pertinent events   12/13 admitted  Interim History / Subjective:  Admitting  Objective   Blood pressure (!) 146/91, pulse 81, temperature 98.7 F (37.1 C), resp. rate 18, height 5\' 2"  (1.575 m), weight 60 kg, SpO2 100 %.    Vent Mode: PRVC FiO2 (%):  [100 %] 100 % Set Rate:  [18 bmp] 18 bmp Vt Set:  [400 mL] 400 mL PEEP:  [5 cmH20] 5 cmH20 Plateau Pressure:  [15 cmH20] 15 cmH20   Intake/Output Summary (Last 24 hours) at 06/22/2021 1148 Last data filed at 06/10/2021 1038 Gross per 24 hour  Intake 1.05 ml  Output 0 ml  Net 1.05 ml   Filed Weights   07/05/2021 1031  Weight: 60 kg    Examination: General: unresponsive elderly woman on vent HENT: pupils small, L pupil irregular, chronic, some dried blood around R nares Lungs: clear, no wheezing, passive on vent Cardiovascular: RRR, ext warm Abdomen: soft, +BS Extremities:  chronic lymphedema Neuro: GCS3 for me, no cough or gag but did get roc GU: foley with yellow urine  Resolved Hospital Problem list   N/a  Assessment & Plan:  Acute metabolic encephalopathy/coma in patient with history of stroke, complex UTIs Hypertensive crisis r/o PRES Respiratory failure due to above on vent DM2 with severe hyperglycemia Mild lactic acidemia Hypernatremia Hypocalcemia  - Vent support, VAP prevention bundle - Insulin gtt targeting CBG 140-180 - Cleviprex gtt titrated to SBP < 180 - Stat MRI, EEG, neuro consult - With hx of recurrent UTIs will do cefepime and f/u UA/urine culture - Tried to call family, no answer  Best Practice (right click and "Reselect all SmartList Selections" daily)   Diet/type: NPO DVT prophylaxis: prophylactic heparin  GI prophylaxis: PPI Lines: N/A Foley:  Yes, and it is still needed Code Status:  full code Last date of multidisciplinary goals of care discussion [trying to reach family]  Labs   CBC: Recent Labs  Lab 06/22/2021 1017 06/21/2021 1034  WBC 17.8*  --   HGB 14.4 16.7*  HCT 47.2* 49.0*  MCV 75.0*  --   PLT 290  --     Basic Metabolic Panel: Recent Labs  Lab 06/17/2021 1034  NA 147*  K 3.0*  CL 109  GLUCOSE 377*  BUN 16  CREATININE 0.50   GFR: Estimated Creatinine Clearance: 39.2 mL/min (by C-G formula based on SCr of 0.5 mg/dL). Recent Labs  Lab 06/15/2021 1017  WBC 17.8*  LATICACIDVEN 3.3*    Liver Function  Tests: No results for input(s): AST, ALT, ALKPHOS, BILITOT, PROT, ALBUMIN in the last 168 hours. No results for input(s): LIPASE, AMYLASE in the last 168 hours. No results for input(s): AMMONIA in the last 168 hours.  ABG    Component Value Date/Time   TCO2 20 (L) 06/10/2021 1034     Coagulation Profile: Recent Labs  Lab 06/19/2021 1017  INR 1.1    Cardiac Enzymes: No results for input(s): CKTOTAL, CKMB, CKMBINDEX, TROPONINI in the last 168 hours.  HbA1C: No results found for:  HGBA1C  CBG: No results for input(s): GLUCAP in the last 168 hours.  Review of Systems:   Cannot assess, comatose  Past Medical History:  See above  Social History:    No drinking, smoking, or drug hx on other chart  Family History:  Mother with HTN, stroke  Allergies Not on File   Home Medications     Critical care time: 35 minutes not including any separately billable procedures

## 2021-06-20 NOTE — Consult Note (Signed)
[facility] Consult Note  VERSIA MIGNOGNA 1932-04-08  [id number].    Requesting MD: Gerhard Munch Chief Complaint/Reason for Consult: found down  HPI:  Julia [name] is an 87yo female with unknown past medical hx who was found down at home this morning. Brought into [facility] via EMS as a level 1 trauma activation. Last seen in her normal state at 0900 yesterday. Found unresponsive and face down with blood by her face and dried blood in nares. Grandson present on scene but unable to provide medical history. Per EMS GCS 5, tachycardic and hypertensive. Intubated by EDP for airway protection.    Review of Systems  Unable to perform ROS: Mental status change   No family history on file.  No past medical history on file.  Social History:  has no history on file for tobacco use, alcohol use, and drug use.  Allergies: Not on File  (Not in a hospital admission)   Prior to Admission medications   Not on File    Blood pressure (!) 229/119, pulse (!) 114, temperature 98.7 F (37.1 C), resp. rate 18, weight 60 kg, SpO2 100 %. Physical Exam: General: elderly female who is laying in bed in acute distress HEENT: head is normocephalic. Dried blood noted in right nare.  Sclera are noninjected.  Right pupil round and reactive, left pupil abnormal and nonreactive.  Ears and nose without any masses or lesions.  Mouth is dry. Dentition fair Heart: tachycardic.  No obvious murmurs, gallops, or rubs noted.  Palpable pedal pulses bilaterally  Lungs: CTAB, no wheezes, rhonchi, or rales noted.  Abd: soft, NT/ND, +BS, no masses, hernias, or organomegaly MS: calves soft and nontender. No acute gross deformities noted to BUE/BLE. 1-2+ pedal edema bilaterally Skin: cool and dry.  no masses, lesions, or rashes Psych: unable to assess Neuro: GCS 7 (E1 V1 M5), no movement of lower extremities noted, posturing noted to LUE and localizing to RUE GU: decreased rectal tone in setting of RSI, stool  noted on DRE but no blood, incontinent of urine  Results for orders placed or performed during the hospital encounter of 06-27-21 (from the past 48 hour(s))  I-Stat Chem 8, ED     Status: Abnormal   Collection Time: 06-27-2021 10:34 AM  Result Value Ref Range   Sodium 147 (H) 135 - 145 mmol/L   Potassium 3.0 (L) 3.5 - 5.1 mmol/L   Chloride 109 98 - 111 mmol/L   BUN 16 8 - 23 mg/dL   Creatinine, Ser 1.32 0.44 - 1.00 mg/dL   Glucose, Bld 440 (H) 70 - 99 mg/dL    Comment: Glucose reference range applies only to samples taken after fasting for at least 8 hours.   Calcium, Ion 0.99 (L) 1.15 - 1.40 mmol/L   TCO2 20 (L) 22 - 32 mmol/L   Hemoglobin 16.7 (H) 12.0 - 15.0 g/dL   HCT 10.2 (H) 72.5 - 36.6 %   No results found.    Assessment/Plan Found down AMS  VDRF CP Hx CVA HTN DM Glaucoma with hx multiple left eye surgeries Hx spine surgery   Plan - CT scans negative for acute injury. Recommend medical admission for management and further work up. We will sign off, call with questions or concerns.  Violeta Gelinas, MD, MPH, FACS Please use [email or web address] to contact on call provider   CRITICAL CARE Performed by: Violeta Gelinas MD   Total critical care time: 47 minutes  Critical care time was exclusive of separately  billable procedures and treating other patients.  Critical care was necessary to treat or prevent imminent or life-threatening deterioration.  Critical care was time spent personally by me on the following activities: development of treatment plan with patient and/or surrogate as well as nursing, discussions with consultants, evaluation of patient's response to treatment, examination of patient, obtaining history from patient or surrogate, ordering and performing treatments and interventions, ordering and review of laboratory studies, ordering and review of radiographic studies, pulse oximetry and re-evaluation of patient's condition.

## 2021-06-20 NOTE — Progress Notes (Signed)
Patient transported to 3M06 from ED without complications. RN at bedside.

## 2021-06-20 NOTE — Progress Notes (Signed)
Pharmacy Antibiotic Note  Julia [name] is a 87 y.o. female admitted on 07/01/2021 with UTI.  Pharmacy has been consulted for Cefepime dosing.  Plan: Cefepime 2g IV q12h -Monitor renal function, clinical status, and antibiotic plan  Height: 5\' 2"  (157.5 cm) Weight: 60 kg (132 lb 4.4 oz) IBW/kg (Calculated) : 50.1  Temp (24hrs), Avg:98.7 F (37.1 C), Min:98.7 F (37.1 C), Max:98.7 F (37.1 C)  Recent Labs  Lab 06/28/2021 1017 06/13/2021 1034  WBC 17.8*  --   CREATININE  --  0.50  LATICACIDVEN 3.3*  --     Estimated Creatinine Clearance: 39.2 mL/min (by C-G formula based on SCr of 0.5 mg/dL).    Not on File  Antimicrobials this admission: Cefepime 12/13 >>   Thank you for allowing pharmacy to be a part of this patients care.  1/14, PharmD, [facility] Emergency Medicine Clinical Pharmacist ED RPh Phone: [phone] Main RX: [phone]

## 2021-06-20 NOTE — ED Provider Notes (Addendum)
[facility] EMERGENCY DEPARTMENT Provider Note   CSN: [id number] Arrival date & time: 06/26/2021  1018     History Chief Complaint  Patient presents with   Lytle Michaels    Julia [name] is a 87 y.o. female.  HPI Patient presents as a level 1 trauma.  Care is coordinated with our surgery colleagues.  History is obtained by EMS as the patient is unresponsive, level 5 caveat secondary to mental status change.  Per report the patient was last seen normal yesterday about 24 hours prior to ED arrival.  She found this prior to EMS notification by family member.  She was reportedly facedown, with urine around her.  She was unresponsive, and there is evidence for facial trauma with blood about her face and nose.  Per EMS the patient was hypertensive, tachypneic, tachycardic, hyperglycemic on arrival.    No past medical history on file.  Patient Active Problem List   Diagnosis Date Noted   Coma ([place]) 06/26/2021     OB History   No obstetric history on file.     Chart merge notable for history of cerebral palsy, cystic fibrosis, prior stroke.  No social history on file    Home Medications Prior to Admission medications   Not on File    Allergies    Patient has no allergy information on record.  Review of Systems   Review of Systems  Unable to perform ROS: Acuity of condition   Physical Exam Updated Vital Signs BP (!) 185/99    Pulse 83    Temp 98.7 F (37.1 C)    Resp 18    Ht 5\' 2"  (1.575 m)    Wt 60 kg    SpO2 100%    BMI 24.19 kg/m   Physical Exam Vitals and nursing note reviewed.  Constitutional:      Appearance: She is well-developed. She is ill-appearing.  HENT:     Head: Normocephalic.   Eyes:     Conjunctiva/sclera: Conjunctivae normal.  Cardiovascular:     Rate and Rhythm: Normal rate and regular rhythm.  Pulmonary:     Effort: No respiratory distress.     Breath sounds: No stridor.     Comments: Audible BS bilat, diminished Abdominal:      General: There is no distension.  Skin:    General: Skin is warm and dry.  Neurological:     Comments: Unresponsive  Psychiatric:        Cognition and Memory: Cognition is impaired. Memory is impaired.    ED Results / Procedures / Treatments   Labs (all labs ordered are listed, but only abnormal results are displayed) Labs Reviewed  CBC - Abnormal; Notable for the following components:      Result Value   WBC 17.8 (*)    RBC 6.29 (*)    HCT 47.2 (*)    MCV 75.0 (*)    MCH 22.9 (*)    RDW 17.3 (*)    All other components within normal limits  LACTIC ACID, PLASMA - Abnormal; Notable for the following components:   Lactic Acid, Venous 3.3 (*)    All other components within normal limits  I-STAT CHEM 8, ED - Abnormal; Notable for the following components:   Sodium 147 (*)    Potassium 3.0 (*)    Glucose, Bld 377 (*)    Calcium, Ion 0.99 (*)    TCO2 20 (*)    Hemoglobin 16.7 (*)    HCT 49.0 (*)  All other components within normal limits  I-STAT ARTERIAL BLOOD GAS, ED - Abnormal; Notable for the following components:   pH, Arterial 7.451 (*)    pCO2 arterial 31.8 (*)    pO2, Arterial 539 (*)    Potassium 2.6 (*)    All other components within normal limits  RESP PANEL BY RT-PCR (FLU A&B, COVID) [id number]  PROTIME-INR  ETHANOL  URINALYSIS, ROUTINE W REFLEX MICROSCOPIC  BLOOD GAS, ARTERIAL  COMPREHENSIVE METABOLIC PANEL  HEMOGLOBIN A1C  HEMOGLOBIN [id number]  BASIC METABOLIC PANEL  BASIC METABOLIC PANEL  LACTIC ACID, PLASMA  LACTIC ACID, PLASMA  SAMPLE TO BLOOD BANK    EKG EKG Interpretation  Date/Time:  Tuesday June 20 2021 12:54:02 EST Ventricular Rate:  86 PR Interval:  158 QRS Duration: 132 QT Interval:  431 QTC Calculation: 516 R Axis:   -83 Text Interpretation: Sinus rhythm Biatrial enlargement RBBB and LAFB Left ventricular hypertrophy Inferior injury pattern Abnormal ECG Confirmed by Carmin Muskrat [phone]) on 06/30/2021 1:50:15 PM  Radiology CT Head  Wo Contrast  Result Date: 06/17/2021 CLINICAL DATA:  Level 1 trauma.  Found down. EXAM: CT HEAD WITHOUT CONTRAST CT MAXILLOFACIAL WITHOUT CONTRAST CT CERVICAL SPINE WITHOUT CONTRAST TECHNIQUE: Multidetector CT imaging of the head, cervical spine, and maxillofacial structures were performed using the standard protocol without intravenous contrast. Multiplanar CT image reconstructions of the cervical spine and maxillofacial structures were also generated. COMPARISON:  None. FINDINGS: CT HEAD FINDINGS Brain: No evidence of acute infarction, hemorrhage, hydrocephalus, extra-axial collection or mass lesion/mass effect. 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 Vascular: No hyperdense vessel or unexpected calcification. Skull: No acute fracture CT MAXILLOFACIAL FINDINGS Osseous: No fracture or mandibular dislocation. No destructive process. Orbits: No evidence of injury Sinuses: Negative for hemosinus Soft tissues: No opaque foreign body or measurable hematoma. CT CERVICAL SPINE FINDINGS Alignment: No traumatic malalignment. Skull base and vertebrae: No acute fracture. No primary bone lesion or focal pathologic process. Soft tissues and spinal canal: No prevertebral fluid or swelling. No visible canal hematoma. Disc levels: Generalized disc collapse with endplate and facet spurring narrowing foramina diffusely, foraminal narrowing especially along the right-side of a scoliotic curvature. Upper chest: Reported separately IMPRESSION: No evidence of acute intracranial or cervical spine injury. Negative for facial fracture. Electronically Signed   By: Jorje Guild M.D.   On: 06/25/2021 11:07   CT Cervical Spine Wo Contrast  Result Date: 06/28/2021 CLINICAL DATA:  Level 1 trauma.  Found down. EXAM: CT HEAD WITHOUT CONTRAST CT MAXILLOFACIAL WITHOUT CONTRAST CT CERVICAL SPINE WITHOUT CONTRAST TECHNIQUE: Multidetector CT imaging of the head, cervical spine, and maxillofacial structures were  performed using the standard protocol without intravenous contrast. Multiplanar CT image reconstructions of the cervical spine and maxillofacial structures were also generated. COMPARISON:  None. FINDINGS: CT HEAD FINDINGS Brain: No evidence of acute infarction, hemorrhage, hydrocephalus, extra-axial collection or mass lesion/mass effect. 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 Vascular: No hyperdense vessel or unexpected calcification. Skull: No acute fracture CT MAXILLOFACIAL FINDINGS Osseous: No fracture or mandibular dislocation. No destructive process. Orbits: No evidence of injury Sinuses: Negative for hemosinus Soft tissues: No opaque foreign body or measurable hematoma. CT CERVICAL SPINE FINDINGS Alignment: No traumatic malalignment. Skull base and vertebrae: No acute fracture. No primary bone lesion or focal pathologic process. Soft tissues and spinal canal: No prevertebral fluid or swelling. No visible canal hematoma. Disc levels: Generalized disc collapse with endplate and facet spurring narrowing foramina diffusely, foraminal narrowing especially along the right-side of a scoliotic  curvature. Upper chest: Reported separately IMPRESSION: No evidence of acute intracranial or cervical spine injury. Negative for facial fracture. Electronically Signed   By: Tiburcio Pea M.D.   On: 06/28/2021 11:07   DG Pelvis Portable  Result Date: 07/01/2021 CLINICAL DATA:  Trauma EXAM: PORTABLE PELVIS 1-2 VIEWS COMPARISON:  None. FINDINGS: Patient is rotated to the left which somewhat evaluation. There is no evidence of pelvic fracture or diastasis. No pelvic bone lesions are seen. Limited evaluation of the sacrum due to overlying bowel gas. IMPRESSION: No acute osseous abnormality. Electronically Signed   By: Allegra Lai M.D.   On: 06/12/2021 10:46   DG Chest Port 1 View  Result Date: 06/27/2021 CLINICAL DATA:  Trauma EXAM: PORTABLE CHEST 1 VIEW COMPARISON:  Chest x-ray dated November 02, 2020 FINDINGS: ET tube tip projects approximately 1.0 cm from the carina. Enteric tube partially seen coursing below diaphragm. Evaluation is limited due to left port patient rotation. Bilateral upper lung linear opacities are unchanged compared to prior exam likely due to scarring or atelectasis. No focal consolidation. No large pleural effusion or evidence of pneumothorax. Limited evaluation of cardiac and mediastinal contours due to patient rotation. IMPRESSION: No acute cardiopulmonary no acute cardiopulmonary abnormality, evaluation somewhat limited due to patient rotation. Electronically Signed   By: Allegra Lai M.D.   On: 07/08/2021 10:44   CT CHEST ABDOMEN PELVIS WO CONTRAST  Result Date: 06/27/2021 CLINICAL DATA:  Level 1 trauma. Fall. EXAM: CT CHEST, ABDOMEN AND PELVIS WITHOUT CONTRAST TECHNIQUE: Multidetector CT imaging of the chest, abdomen and pelvis was performed following the standard protocol without IV contrast. COMPARISON:  None similar FINDINGS: CT CHEST FINDINGS Cardiovascular: Normal heart size. Trace anterior pericardial effusion which appears low-density. Coronary atherosclerosis. Mediastinum/Nodes: Endotracheal and enteric tubes are in unremarkable position. No hematoma or pneumomediastinum. 3.2 cm left thyroid nodule, unchanged from 2020. This was evaluated by ultrasound in 2019. No follow-up recommended unless clinically warranted (ref: J Am Coll Radiol. 2015 Feb;12(2): 143-50). Lungs/Pleura: Bands of scarring in the upper lungs with mild, cylindrical left upper lobe bronchiectasis. Prior left apical surgery with lung sutures. No hemothorax, pneumothorax, or lung contusion. Musculoskeletal: Scoliosis.  No acute fracture or subluxation. CT ABDOMEN PELVIS FINDINGS Hepatobiliary: No hepatic injury or perihepatic hematoma. Gallbladder is unremarkable Pancreas: Unremarkable Spleen: No splenic injury or perisplenic hematoma. Adrenals/Urinary Tract: No adrenal hemorrhage or renal  injury identified. Bladder is unremarkable. Bilateral nephrolithiasis. Adjacent or lobulated right lower pole calculus in total measuring 9 mm. 3 mm interpolar left renal calculus. Stomach/Bowel: No evidence of injury. Rectal stool distention. The enteric tube tip is at the stomach. Vascular/Lymphatic: Stranding around the right femoral vessels related to phlebotomy per report. Atheromatous calcification. Reproductive: Hysterectomy Other: No ascites or pneumoperitoneum Musculoskeletal: Scoliosis and ordinary degeneration for age. No acute fracture or subluxation. IMPRESSION: No evidence of acute intrathoracic or intra-abdominal injury. Electronically Signed   By: Tiburcio Pea M.D.   On: 06/28/2021 11:05   CT Maxillofacial Wo Contrast  Result Date: 06/09/2021 CLINICAL DATA:  Level 1 trauma.  Found down. EXAM: CT HEAD WITHOUT CONTRAST CT MAXILLOFACIAL WITHOUT CONTRAST CT CERVICAL SPINE WITHOUT CONTRAST TECHNIQUE: Multidetector CT imaging of the head, cervical spine, and maxillofacial structures were performed using the standard protocol without intravenous contrast. Multiplanar CT image reconstructions of the cervical spine and maxillofacial structures were also generated. COMPARISON:  None. FINDINGS: CT HEAD FINDINGS Brain: No evidence of acute infarction, hemorrhage, hydrocephalus, extra-axial collection or mass lesion/mass effect. Generalized atrophy with remote left parietooccipital  infarct. Vascular: No hyperdense vessel or unexpected calcification. Skull: No acute fracture CT MAXILLOFACIAL FINDINGS Osseous: No fracture or mandibular dislocation. No destructive process. Orbits: No evidence of injury Sinuses: Negative for hemosinus Soft tissues: No opaque foreign body or measurable hematoma. CT CERVICAL SPINE FINDINGS Alignment: No traumatic malalignment. Skull base and vertebrae: No acute fracture. No primary bone lesion or focal pathologic process. Soft tissues and spinal canal: No prevertebral fluid or  swelling. No visible canal hematoma. Disc levels: Generalized disc collapse with endplate and facet spurring narrowing foramina diffusely, foraminal narrowing especially along the right-side of a scoliotic curvature. Upper chest: Reported separately IMPRESSION: No evidence of acute intracranial or cervical spine injury. Negative for facial fracture. Electronically Signed   By: Jorje Guild M.D.   On: 06/12/2021 11:07    Procedures Procedure Name: Intubation Date/Time: 06/28/2021 10:50 AM Performed by: Carmin Muskrat, MD Pre-anesthesia Checklist: Patient identified, Patient being monitored, Emergency Drugs available, Timeout performed and Suction available Oxygen Delivery Method: Ambu bag Preoxygenation: Pre-oxygenation with 100% oxygen Induction Type: Rapid sequence Ventilation: Mask ventilation without difficulty Laryngoscope Size: Glidescope Grade View: Grade III Tube size: 7.5 mm Number of attempts: 1 Airway Equipment and Method: Rigid stylet Placement Confirmation: ETT inserted through vocal cords under direct vision, CO2 detector and Breath sounds checked- equal and bilateral Secured at: 24 cm Tube secured with: ETT holder Dental Injury: Teeth and Oropharynx as per pre-operative assessment     CRITICAL CARE Performed by: Carmin Muskrat Total critical care time: 35 minutes Critical care time was exclusive of separately billable procedures and treating other patients. Critical care was necessary to treat or prevent imminent or life-threatening deterioration. Critical care was time spent personally by me on the following activities: development of treatment plan with patient and/or surrogate as well as nursing, discussions with consultants, evaluation of patient's response to treatment, examination of patient, obtaining history from patient or surrogate, ordering and performing treatments and interventions, ordering and review of laboratory studies, ordering and review of  radiographic studies, pulse oximetry and re-evaluation of patient's condition.   Medications Ordered in ED Medications  etomidate (AMIDATE) injection (20 mg Intravenous Given 06/17/2021 1021)  rocuronium (ZEMURON) injection (100 mg Intravenous Given 06/21/2021 1022)  propofol (DIPRIVAN) 1000 MG/100ML infusion (0 mcg/kg/min  60 kg (Order-Specific) Intravenous Stopped 06/21/2021 1036)  fentaNYL 2563mcg in NS 295mL (21mcg/ml) infusion-PREMIX (100 mcg/hr Intravenous Rate/Dose Change 06/30/2021 1118)  fentaNYL (SUBLIMAZE) bolus via infusion 25-100 mcg (has no administration in time range)  propofol (DIPRIVAN) 1000 MG/100ML infusion (60 mcg/kg/min  60 kg Intravenous Rate/Dose Change 06/12/2021 1118)  labetalol (NORMODYNE) injection 20 mg (20 mg Intravenous Given 06/09/2021 1120)  docusate (COLACE) 50 MG/5ML liquid 100 mg (has no administration in time range)  polyethylene glycol (MIRALAX / GLYCOLAX) packet 17 g (has no administration in time range)  heparin injection 5,000 Units (has no administration in time range)  pantoprazole (PROTONIX) injection 40 mg (has no administration in time range)  ondansetron (ZOFRAN) injection 4 mg (has no administration in time range)  insulin regular, human (MYXREDLIN) 100 units/ 100 mL infusion (has no administration in time range)  lactated ringers infusion (has no administration in time range)  dextrose 5 % in lactated ringers infusion (has no administration in time range)  dextrose 50 % solution 0-50 mL (has no administration in time range)  calcium gluconate 2 g/ 100 mL sodium chloride IVPB (has no administration in time range)  potassium chloride (KLOR-CON) packet 40 mEq (has no administration in time range)  clevidipine (  CLEVIPREX) infusion 0.5 mg/mL (has no administration in time range)  ceFEPIme (MAXIPIME) 2 g in sodium chloride 0.9 % 100 mL IVPB (has no administration in time range)  fentaNYL (SUBLIMAZE) injection 25 mcg (25 mcg Intravenous Given 07/02/2021 1037)     ED Course  I have reviewed the triage vital signs and the nursing notes.  Pertinent labs & imaging results that were available during my care of the patient were reviewed by me and considered in my medical decision making (see chart for details).  With concern for airway protection in the context of ongoing trauma evaluation and consideration of her unresponsiveness patient had intubation for airway protection.  This was complex without complication.  Subsequently, patient had evaluation with our trauma colleagues, CT, labs, x-ray.  Update:, CT reviewed, discussed with trauma.  No evidence for traumatic abnormalities/findings.  I discussed her case with our critical care colleagues for admission and further monitoring, management. Other labs notable for lactic acidosis, hypernatremia and slight elevation in creatinine.  Notably, the patient also found to have persistently elevated blood pressure in spite of propofol, fentanyl, requiring additional beta-blocker provision. With concern for altered mental status, unresponsiveness, likely fall patient admitted to critical care for further monitoring, management.  Final Clinical Impression(s) / ED Diagnoses Final diagnoses:  Trauma  Injury of head, initial encounter  Unresponsiveness     Carmin Muskrat, MD 06/22/2021 1348    Carmin Muskrat, MD 06/22/2021 1351

## 2021-06-20 NOTE — Progress Notes (Signed)
Patient transported to CT and back without complications. RN at bedside.  

## 2021-06-20 NOTE — Consult Note (Addendum)
Neurology Consultation  Reason for Consult: AMS Referring Physician: Dr. Erskine Emery  CC: Found down/possible fall  History is obtained from: Chart review  HPI: Julia [name] is a 87 y.o. female past medical history of UTIs, cerebral palsy, prior history of embolic strokes and TIAs with residual deficits unknown, hypertension, diabetes, sarcoidosis, brought into the emergency room as Riley Lam reviewed from MRN [id number], with facial trauma and unable to provide any history, emergently intubated.  Neurological consult obtained because patient did not have any clear-cut reason for being altered-CT head with no evidence of acute bleed.  She did come in hypotensive with systolic in the [id number] range and blood sugars in the high 300s range.  Lactate was elevated. Patient was unable to provide any history at this time.  There is no known history of seizure disorder.   LKW: Unknown tpa given?: no, unknown last known well Premorbid modified Rankin scale (mRS): Unable to ascertain  [id number] to obtain due to altered mental status.   No past medical history on file. Past medical history as in the other chart-documented in the HPI  No family history on file.  Social History:   has no history on file for tobacco use, alcohol use, and drug use.  Medications  Current Facility-Administered Medications:    calcium gluconate 2 g/ 100 mL sodium chloride IVPB, 2 g, Intravenous, Once, Candee Furbish, MD   dextrose 5 % in lactated ringers infusion, , Intravenous, Continuous, Candee Furbish, MD   dextrose 50 % solution 0-50 mL, 0-50 mL, Intravenous, PRN, Candee Furbish, MD   docusate (COLACE) 50 MG/5ML liquid 100 mg, 100 mg, Per Tube, BID PRN, Candee Furbish, MD   etomidate (AMIDATE) injection, , Intravenous, PRN, Carmin Muskrat, MD, 20 mg at 06/08/2021 1021   fentaNYL (SUBLIMAZE) bolus via infusion 25-100 mcg, 25-100 mcg, Intravenous, Q15 min PRN, Carmin Muskrat, MD   fentaNYL 258mcg in NS  278mL (93mcg/ml) infusion-PREMIX, 25-200 mcg/hr, Intravenous, Continuous, Carmin Muskrat, MD, Last Rate: 10 mL/hr at 06/12/2021 1118, 100 mcg/hr at 06/26/2021 1118   heparin injection 5,000 Units, 5,000 Units, Subcutaneous, Q8H, Candee Furbish, MD   insulin regular, human (MYXREDLIN) 100 units/ 100 mL infusion, , Intravenous, Continuous, Candee Furbish, MD   labetalol (NORMODYNE) injection 20 mg, 20 mg, Intravenous, Q10 min PRN, Carmin Muskrat, MD, 20 mg at 06/28/2021 1120   lactated ringers infusion, , Intravenous, Continuous, Candee Furbish, MD   ondansetron [facility]) injection 4 mg, 4 mg, Intravenous, Q6H PRN, Candee Furbish, MD   pantoprazole (PROTONIX) injection 40 mg, 40 mg, Intravenous, QHS, Candee Furbish, MD   polyethylene glycol (MIRALAX / GLYCOLAX) packet 17 g, 17 g, Per Tube, Daily PRN, Candee Furbish, MD   propofol (DIPRIVAN) 1000 MG/100ML infusion, , Intravenous, Continuous PRN, Carmin Muskrat, MD, Stopped at 06/15/2021 1036   propofol (DIPRIVAN) 1000 MG/100ML infusion, 0-50 mcg/kg/min, Intravenous, Continuous, Carmin Muskrat, MD, Last Rate: 21.6 mL/hr at 06/24/2021 1118, 60 mcg/kg/min at 07/03/2021 1118   rocuronium (ZEMURON) injection, , Intravenous, PRN, Carmin Muskrat, MD, 100 mg at 06/08/2021 1022 No current outpatient medications on file.   Glipizide, aspirin 81, metformin, amlodipine-outpatient medications  Exam: Current vital signs: BP (!) 185/99    Pulse 83    Temp 98.7 F (37.1 C)    Resp 18    Ht 5\' 2"  (1.575 m)    Wt 60 kg    SpO2 100%    BMI 24.19 kg/m  Vital signs in last  24 hours: Temp:  [98.7 F (37.1 C)] 98.7 F (37.1 C) (12/13 1031) Pulse Rate:  [81-133] 83 (12/13 1145) Resp:  [18-20] 18 (12/13 1145) BP: (102-229)/(70-119) 185/99 (12/13 1145) SpO2:  [100 %] 100 % (12/13 1145) FiO2 (%):  [100 %] 100 % (12/13 1057) Weight:  [60 kg] 60 kg (12/13 1031) General: Given sedatives and paralytics for intubation, sedated intubated HEENT:  Normocephalic/atraumatic Cardiovascular: Regular rate rhythm Respiratory: Vented Abdomen: Nondistended nontender Neurological exam Her exam is limited by the recent paralytic administration. No spontaneous movements No response to voice or noxious stimulation Cranial nerves: Right pupil is 2 mm sluggishly reactive, left pupil is pinpoint, gaze is midline, does not blink to threat from either side, breathing with the ventilator at this time. Motor exam: No spontaneous movement, no movement of neck stimulation Sensory exam: As above Coordination cannot assess due to her mentation  LATE ADDENDUM FOR STROKE QUALITY - NIHSS 32  Labs I have reviewed labs in epic and the results pertinent to this consultation are:  CBC    Component Value Date/Time   WBC 17.8 (H) 07/03/21 1017   RBC 6.29 (H) July 03, 2021 1017   HGB 16.7 (H) 07/03/21 1034   HCT 49.0 (H) 07/03/2021 1034   PLT 290 2021-07-03 1017   MCV 75.0 (L) 03-Jul-2021 1017   MCH 22.9 (L) 2021/07/03 1017   MCHC 30.5 07/03/2021 1017   RDW 17.3 (H) 07/03/2021 1017    CMP     Component Value Date/Time   NA 147 (H) 2021/07/03 1034   K 3.0 (L) 03-Jul-2021 1034   CL 109 July 03, 2021 1034   GLUCOSE 377 (H) 07-03-21 1034   BUN 16 July 03, 2021 1034   CREATININE 0.50 03-Jul-2021 1034   Imaging I have reviewed the images obtained:  CT-head and C-spine unremarkable for acute process   Assessment: 87 year old with above past medical history found down with no clear antecedent history.  Neurological consultation due to prior history of CP and unclear cause of her mentation. Multiple derangements on the lab including leukocytosis, lactic acidosis, hyperglycemia, hypernatremia making toxic metabolic encephalopathy the most likely diagnosis. In addition, seizures and strokes remain in the differential although there is no clear timeline of last known well hence she is not in any intervention window.  No family member available at this time to  provide any history.   Recommendations: Stat EEG MRI brain w/o contrast Correction of toxic metabolic derangements per primary team Check urinalysis given prior history of recurrent UTIs Will follow Discussed with Dr. Katrinka Blazing -- Milon Dikes, MD Neurologist [facility] Pager: [phone]  CRITICAL CARE ATTESTATION Performed by: Milon Dikes, MD Total critical care time: 32 minutes Critical care time was exclusive of separately billable procedures and treating other patients and/or supervising APPs/Residents/Students Critical care was necessary to treat or prevent imminent or life-threatening deterioration due to toxic metabolic encephalopathy This patient is critically ill and at significant risk for neurological worsening and/or death and care requires constant monitoring. Critical care was time spent personally by me on the following activities: development of treatment plan with patient and/or surrogate as well as nursing, discussions with consultants, evaluation of patient's response to treatment, examination of patient, obtaining history from patient or surrogate, ordering and performing treatments and interventions, ordering and review of laboratory studies, ordering and review of radiographic studies, pulse oximetry, re-evaluation of patient's condition, participation in multidisciplinary rounds and medical decision making of high complexity in the care of this patient.

## 2021-06-20 NOTE — Procedures (Signed)
Central Venous Catheter Insertion Procedure Note  Julia [name]  [id number]  1932-04-10  Date:03-Jul-2021  Time:12:56 PM   Provider Performing:Eriyonna Matsushita Erby Pian   Procedure: Insertion of Non-tunneled Central Venous Catheter(36556) with US guidance (73532)   Indication(s) Medication administration  Consent Unable to obtain consent due to emergent nature of procedure.  Anesthesia Topical only with 1% lidocaine   Timeout Verified patient identification, verified procedure, site/side was marked, verified correct patient position, special equipment/implants available, medications/allergies/relevant history reviewed, required imaging and test results available.  Sterile Technique Maximal sterile technique including full sterile barrier drape, hand hygiene, sterile gown, sterile gloves, mask, hair covering, sterile ultrasound probe cover (if used).  Procedure Description Area of catheter insertion was cleaned with chlorhexidine and draped in sterile fashion.  With real-time ultrasound guidance a central venous catheter was placed into the left internal jugular vein. Nonpulsatile blood flow and easy flushing noted in all ports.  The catheter was sutured in place and sterile dressing applied.  Complications/Tolerance None; patient tolerated the procedure well. Chest X-ray is ordered to verify placement for internal jugular or subclavian cannulation.   Chest x-ray is not ordered for femoral cannulation.  EBL Minimal  Specimen(s) None

## 2021-06-21 ENCOUNTER — Inpatient Hospital Stay (HOSPITAL_COMMUNITY): Payer: Medicare Other

## 2021-06-21 ENCOUNTER — Other Ambulatory Visit (HOSPITAL_COMMUNITY): Payer: Medicare Other

## 2021-06-21 DIAGNOSIS — E440 Moderate protein-calorie malnutrition: Secondary | ICD-10-CM

## 2021-06-21 DIAGNOSIS — J988 Other specified respiratory disorders: Secondary | ICD-10-CM

## 2021-06-21 DIAGNOSIS — I6310 Cerebral infarction due to embolism of unspecified precerebral artery: Secondary | ICD-10-CM

## 2021-06-21 DIAGNOSIS — E11641 Type 2 diabetes mellitus with hypoglycemia with coma: Secondary | ICD-10-CM

## 2021-06-21 DIAGNOSIS — I6340 Cerebral infarction due to embolism of unspecified cerebral artery: Secondary | ICD-10-CM | POA: Insufficient documentation

## 2021-06-21 LAB — CBC
HCT: 39.9 % (ref 36.0–46.0)
Hemoglobin: 12.3 g/dL (ref 12.0–15.0)
MCH: 23.2 pg — ABNORMAL LOW (ref 26.0–34.0)
MCHC: 30.8 g/dL (ref 30.0–36.0)
MCV: 75.1 fL — ABNORMAL LOW (ref 80.0–100.0)
Platelets: 249 10*3/uL (ref 150–400)
RBC: 5.31 MIL/uL — ABNORMAL HIGH (ref 3.87–5.11)
RDW: 16.5 % — ABNORMAL HIGH (ref 11.5–15.5)
WBC: 19 10*3/uL — ABNORMAL HIGH (ref 4.0–10.5)
nRBC: 0 % (ref 0.0–0.2)

## 2021-06-21 LAB — LIPID PANEL
Cholesterol: 94 mg/dL (ref 0–200)
HDL: 61 mg/dL (ref >40)
LDL Cholesterol: 25 mg/dL (ref 0–99)
Total CHOL/HDL Ratio: 1.5 RATIO
Triglycerides: 41 mg/dL (ref <150)
VLDL: 8 mg/dL (ref 0–40)

## 2021-06-21 LAB — GLUCOSE, CAPILLARY
Glucose-Capillary: 201 mg/dL — ABNORMAL HIGH (ref 70–99)
Glucose-Capillary: 180 mg/dL — ABNORMAL HIGH (ref 70–99)
Glucose-Capillary: 157 mg/dL — ABNORMAL HIGH (ref 70–99)
Glucose-Capillary: 134 mg/dL — ABNORMAL HIGH (ref 70–99)
Glucose-Capillary: 113 mg/dL — ABNORMAL HIGH (ref 70–99)
Glucose-Capillary: 157 mg/dL — ABNORMAL HIGH (ref 70–99)
Glucose-Capillary: 148 mg/dL — ABNORMAL HIGH (ref 70–99)
Glucose-Capillary: 150 mg/dL — ABNORMAL HIGH (ref 70–99)
Glucose-Capillary: 143 mg/dL — ABNORMAL HIGH (ref 70–99)
Glucose-Capillary: 223 mg/dL — ABNORMAL HIGH (ref 70–99)
Glucose-Capillary: 257 mg/dL — ABNORMAL HIGH (ref 70–99)
Glucose-Capillary: 196 mg/dL — ABNORMAL HIGH (ref 70–99)

## 2021-06-21 LAB — PHOSPHORUS: Phosphorus: 1.7 mg/dL — ABNORMAL LOW (ref 2.5–4.6)

## 2021-06-21 LAB — BASIC METABOLIC PANEL
Anion gap: 7 (ref 5–15)
BUN: 18 mg/dL (ref 8–23)
CO2: 23 mmol/L (ref 22–32)
Calcium: 9.3 mg/dL (ref 8.9–10.3)
Chloride: 117 mmol/L — ABNORMAL HIGH (ref 98–111)
Creatinine, Ser: 0.94 mg/dL (ref 0.44–1.00)
GFR, Estimated: 59 mL/min — ABNORMAL LOW (ref >60)
Glucose, Bld: 153 mg/dL — ABNORMAL HIGH (ref 70–99)
Potassium: 4.3 mmol/L (ref 3.5–5.1)
Sodium: 147 mmol/L — ABNORMAL HIGH (ref 135–145)

## 2021-06-21 LAB — MAGNESIUM: Magnesium: 1.9 mg/dL (ref 1.7–2.4)

## 2021-06-21 LAB — TRIGLYCERIDES: Triglycerides: 57 mg/dL (ref <150)

## 2021-06-21 MED ORDER — SODIUM CHLORIDE 0.9% FLUSH
10.0000 mL | Freq: Two times a day (BID) | INTRAVENOUS | Status: DC
  Administered 2021-06-21: 22:00:00 30 mL
  Administered 2021-06-21 – 2021-06-22 (×3): 10 mL
  Administered 2021-06-23: 20 mL
  Administered 2021-06-23 – 2021-06-26 (×5): 10 mL
  Administered 2021-06-26: 10:00:00 20 mL
  Administered 2021-06-27 – 2021-06-28 (×4): 10 mL

## 2021-06-21 MED ORDER — POTASSIUM PHOSPHATES 15 MMOLE/5ML IV SOLN
15.0000 mmol | Freq: Once | INTRAVENOUS | Status: AC
  Administered 2021-06-21: 13:00:00 15 mmol via INTRAVENOUS
  Filled 2021-06-21: qty 5

## 2021-06-21 MED ORDER — LORAZEPAM 2 MG/ML IJ SOLN
2.0000 mg | Freq: Once | INTRAMUSCULAR | Status: AC
  Administered 2021-06-21: 23:00:00 2 mg via INTRAVENOUS
  Filled 2021-06-21: qty 1

## 2021-06-21 MED ORDER — SODIUM CHLORIDE 0.9 % IV SOLN: INTRAVENOUS | Status: DC | PRN

## 2021-06-21 MED ORDER — INSULIN ASPART 100 UNIT/ML IJ SOLN
2.0000 [IU] | INTRAMUSCULAR | Status: DC
Start: 2021-06-21 — End: 2021-06-21

## 2021-06-21 MED ORDER — POTASSIUM & SODIUM PHOSPHATES 280-160-250 MG PO PACK: 1.0000 | PACK | Freq: Three times a day (TID) | ORAL | Status: DC

## 2021-06-21 MED ORDER — LEVETIRACETAM IN NACL 1000 MG/100ML IV SOLN
1000.0000 mg | Freq: Once | INTRAVENOUS | Status: AC
  Administered 2021-06-21: 15:00:00 1000 mg via INTRAVENOUS
  Filled 2021-06-21: qty 100

## 2021-06-21 MED ORDER — PROSOURCE TF PO LIQD
45.0000 mL | Freq: Two times a day (BID) | ORAL | Status: DC
  Filled 2021-06-21: qty 45

## 2021-06-21 MED ORDER — ASPIRIN 325 MG PO TABS
325.0000 mg | ORAL_TABLET | Freq: Every day | ORAL | Status: DC
Start: 2021-06-21 — End: 2021-06-29
  Administered 2021-06-21 – 2021-06-29 (×9): 325 mg
  Filled 2021-06-21 (×9): qty 1

## 2021-06-21 MED ORDER — SODIUM CHLORIDE 0.9% FLUSH
10.0000 mL | INTRAVENOUS | Status: DC | PRN
  Administered 2021-06-21: 22:00:00 10 mL

## 2021-06-21 MED ORDER — VITAL AF 1.2 CAL PO LIQD
1000.0000 mL | ORAL | Status: DC
  Administered 2021-06-21 – 2021-06-27 (×7): 1000 mL

## 2021-06-21 MED ORDER — MIDAZOLAM HCL 2 MG/2ML IJ SOLN
2.0000 mg | INTRAMUSCULAR | Status: DC | PRN
  Administered 2021-06-21 (×2): 2 mg via INTRAVENOUS
  Filled 2021-06-21 (×3): qty 2

## 2021-06-21 MED ORDER — INSULIN ASPART 100 UNIT/ML IJ SOLN
0.0000 [IU] | INTRAMUSCULAR | Status: DC
  Administered 2021-06-21: 17:00:00 5 [IU] via SUBCUTANEOUS
  Administered 2021-06-21: 3 [IU] via SUBCUTANEOUS
  Administered 2021-06-21: 21:00:00 8 [IU] via SUBCUTANEOUS
  Administered 2021-06-21: 13:00:00 2 [IU] via SUBCUTANEOUS
  Administered 2021-06-22 (×2): 5 [IU] via SUBCUTANEOUS
  Administered 2021-06-22: 2 [IU] via SUBCUTANEOUS
  Administered 2021-06-22 (×2): 5 [IU] via SUBCUTANEOUS
  Administered 2021-06-22 – 2021-06-23 (×2): 3 [IU] via SUBCUTANEOUS
  Administered 2021-06-23 (×5): 5 [IU] via SUBCUTANEOUS
  Administered 2021-06-24: 3 [IU] via SUBCUTANEOUS
  Administered 2021-06-24: 5 [IU] via SUBCUTANEOUS
  Administered 2021-06-24: 3 [IU] via SUBCUTANEOUS
  Administered 2021-06-24: 5 [IU] via SUBCUTANEOUS
  Administered 2021-06-24 – 2021-06-25 (×3): 3 [IU] via SUBCUTANEOUS
  Administered 2021-06-25: 19:00:00 5 [IU] via SUBCUTANEOUS
  Administered 2021-06-25 – 2021-06-26 (×3): 2 [IU] via SUBCUTANEOUS
  Administered 2021-06-26: 04:00:00 3 [IU] via SUBCUTANEOUS
  Administered 2021-06-26: 16:00:00 5 [IU] via SUBCUTANEOUS
  Administered 2021-06-26 – 2021-06-27 (×3): 3 [IU] via SUBCUTANEOUS
  Administered 2021-06-27: 20:00:00 2 [IU] via SUBCUTANEOUS
  Administered 2021-06-27 – 2021-06-28 (×6): 3 [IU] via SUBCUTANEOUS
  Administered 2021-06-28 (×2): 2 [IU] via SUBCUTANEOUS
  Administered 2021-06-29: 09:00:00 5 [IU] via SUBCUTANEOUS
  Administered 2021-06-29: 3 [IU] via SUBCUTANEOUS
  Administered 2021-06-29: 05:00:00 5 [IU] via SUBCUTANEOUS
  Administered 2021-06-29: 12:00:00 3 [IU] via SUBCUTANEOUS

## 2021-06-21 MED ORDER — PIPERACILLIN-TAZOBACTAM 3.375 G IVPB
3.3750 g | Freq: Three times a day (TID) | INTRAVENOUS | Status: AC
  Administered 2021-06-21 – 2021-06-24 (×10): 3.375 g via INTRAVENOUS
  Filled 2021-06-21 (×10): qty 50

## 2021-06-21 MED ORDER — INSULIN DETEMIR 100 UNIT/ML ~~LOC~~ SOLN
5.0000 [IU] | Freq: Two times a day (BID) | SUBCUTANEOUS | Status: DC
  Administered 2021-06-21 – 2021-06-22 (×3): 5 [IU] via SUBCUTANEOUS
  Filled 2021-06-21 (×4): qty 0.05

## 2021-06-21 MED ORDER — IOHEXOL 350 MG/ML SOLN
75.0000 mL | Freq: Once | INTRAVENOUS | Status: AC | PRN
  Administered 2021-06-21: 14:00:00 75 mL via INTRAVENOUS

## 2021-06-21 MED ORDER — VITAL HIGH PROTEIN PO LIQD: 1000.0000 mL | ORAL | Status: DC

## 2021-06-21 MED ORDER — LEVETIRACETAM IN NACL 500 MG/100ML IV SOLN
500.0000 mg | Freq: Two times a day (BID) | INTRAVENOUS | Status: DC
  Administered 2021-06-21: 22:00:00 500 mg via INTRAVENOUS
  Filled 2021-06-21: qty 100

## 2021-06-21 MED ORDER — PANTOPRAZOLE 2 MG/ML SUSPENSION
40.0000 mg | Freq: Every day | ORAL | Status: DC
Start: 2021-06-21 — End: 2021-06-29
  Administered 2021-06-21 – 2021-06-29 (×9): 40 mg
  Filled 2021-06-21 (×9): qty 20

## 2021-06-21 MED ORDER — CHLORHEXIDINE GLUCONATE CLOTH 2 % EX PADS
6.0000 | MEDICATED_PAD | Freq: Every day | CUTANEOUS | Status: DC
  Administered 2021-06-22 – 2021-06-29 (×9): 6 via TOPICAL

## 2021-06-21 MED ORDER — MUPIROCIN 2 % EX OINT
TOPICAL_OINTMENT | Freq: Two times a day (BID) | CUTANEOUS | Status: DC
  Administered 2021-06-21 – 2021-06-28 (×11): 1 via NASAL
  Filled 2021-06-21: qty 22

## 2021-06-21 MED ORDER — INSULIN DETEMIR 100 UNIT/ML ~~LOC~~ SOLN
5.0000 [IU] | Freq: Two times a day (BID) | SUBCUTANEOUS | Status: DC
  Filled 2021-06-21: qty 0.05

## 2021-06-21 MED ORDER — POTASSIUM & SODIUM PHOSPHATES 280-160-250 MG PO PACK
2.0000 | PACK | Freq: Once | ORAL | Status: AC
  Administered 2021-06-21: 15:00:00 2
  Filled 2021-06-21: qty 2

## 2021-06-21 NOTE — Progress Notes (Signed)
Patient was taken to CT & back to 3M06 without any complications.

## 2021-06-21 NOTE — Progress Notes (Signed)
eLink Physician-Brief Progress Note Patient Name: Julia [name] DOB: [birth year] MRN: [id number]   Date of Service  06/21/2021  HPI/Events of Note  Blood sugar consistently < 180 mg / dl, patient never had an anion gap.  eICU Interventions  Hyperglycemia SQ Insulin transition orders entered.        Thomasene Lot Arletha Marschke 06/21/2021, 6:11 AM

## 2021-06-21 NOTE — Progress Notes (Signed)
NAME:  Julia [name], MRN:  [id number], DOB:  22-Nov-1931, LOS: 1 ADMISSION DATE:  06/30/2021, CONSULTATION DATE:  07/01/2021 REFERRING MD:  Dr. Jeraldine Loots, ER, CHIEF COMPLAINT:  AMS   History of Present Illness:  87 yo female found unresponsive at home.  Required intubation for airway protection.  Found to have leukocytosis, lactic acidosis, hyperglycemia, hypernatremia, HTN (BP 229/119).  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  Pertinent  Medical History  GERD, Esophageal stricture, UTI, Rhabdo, CP, Lt eye aphakia, Cataracts, CVA, HTN, DM type 2  Significant Hospital Events: Including procedures, antibiotic start and stop dates in addition to other pertinent events   12/13 Admit, neurology and trauma consulted  Studies:  CT head 12/13 >> no acute infarction/hemorrhage/hydrocephalus; remote Lt parieto-occipital infarct CT chest 12/13 >> 3.2 cm Lt thyroid nodule (stable since 2020), scarring in upper lungs with cylindrical BTX LUL, prior Lt apical lung surgery, scoliosis EEG 12/13 >> generalized slowing and Rt hemisphere lateralization MRI brain 12/13 >> Restricted diffusion in Rt frontal parietal cortex primarily in Rt MCA territory but also Rt ACA/PCA and Lt ACA/MCA territories; concerning for embolic CVA  Interim History / Subjective:  Remains on sedation.  Objective   Blood pressure 125/60, pulse 82, temperature 99 F (37.2 C), resp. rate 16, height 5\' 2"  (1.575 m), weight 48.2 kg, SpO2 100 %.    Vent Mode: PRVC FiO2 (%):  [40 %-100 %] 40 % Set Rate:  [16 bmp-18 bmp] 16 bmp Vt Set:  [400 mL] 400 mL PEEP:  [5 cmH20] 5 cmH20 Plateau Pressure:  [15 cmH20-17 cmH20] 17 cmH20   Intake/Output Summary (Last 24 hours) at 06/21/2021 0950 Last data filed at 06/21/2021 0900 Gross per 24 hour  Intake 2667.89 ml  Output 580 ml  Net 2087.89 ml   Filed Weights   07/07/2021 1031 06/21/21 0500  Weight: 60 kg 48.2 kg    Examination:  General - on vent Eyes - pupils pinpoint ENT - ETT in  place Cardiac - regular rate/rhythm, no murmur Chest - equal breath sounds b/l, no wheezing or rales Abdomen - soft, non tender, + bowel sounds Extremities - lymphedema lower legs Skin - no rashes Neuro - no response to stimuli  Resolved Hospital Problem list   Hyperkalemia, Lactic acidosis  Assessment & Plan:   Acute metabolic encephalopathy with acute embolic CVA and prior hx of CVA. - neurology consulted - RASS goal 0 - f/u Echo, doppler u/s legs, CT angio head and neck - continue ASA  Compromised airway. - pressure support wean as able - mental status barrier to extubation trial at this time - f/u CXR intermittently - goal SpO2 > 92%  HTN emergency. - goal SBP < 180  DM type 2 poorly controlled with hyperglycemia. - transition off insulin gtt 12/14  Recurrent UTI. - day 2 of ABx - f/u blood cultures from 12/13  Best Practice (right click and "Reselect all SmartList Selections" daily)   Diet/type: tubefeeds DVT prophylaxis: prophylactic heparin  GI prophylaxis: PPI Lines: Central line Foley:  N/A Code Status:  full code Last date of multidisciplinary goals of care discussion [x]   Labs    CMP Latest Ref Rng & Units 06/21/2021 07/04/2021 06/27/2021  Glucose 70 - 99 mg/dL 06/22/2021) 06/22/2021) 625(W)  BUN 8 - 23 mg/dL 18 18 16   Creatinine 0.44 - 1.00 mg/dL 389(H 734(K  Sodium 135 - 145 mmol/L 147(H) 146(H) 144  Potassium 3.5 - 5.1 mmol/L 4.3 5.0 6.9(HH)  Chloride 98 - 111 mmol/L  117(H) 118(H) 115(H)  CO2 22 - 32 mmol/L 23 22 20(L)  Calcium 8.9 - 10.3 mg/dL 9.3 9.3 9.4  Total Protein 6.5 - 8.1 g/dL - - -  Total Bilirubin 0.3 - 1.2 mg/dL - - -  Alkaline Phos 38 - 126 U/L - - -  AST 15 - 41 U/L - - -  ALT 0 - 44 U/L - - -    CBC Latest Ref Rng & Units 06/21/2021 06/26/2021 07/03/2021  WBC 4.0 - 10.5 K/uL 19.0(H) - -  Hemoglobin 12.0 - 15.0 g/dL 12.3 15.0 16.7(H)  Hematocrit 36.0 - 46.0 % 39.9 44.0 49.0(H)  Platelets 150 - 400 K/uL 249 - -    ABG     Component Value Date/Time   PHART 7.451 (H) 07/07/2021 1204   PCO2ART 31.8 (L) 07/05/2021 1204   PO2ART 539 (H) 06/28/2021 1204   HCO3 22.1 06/16/2021 1204   TCO2 23 07/04/2021 1204   ACIDBASEDEF 1.0 06/23/2021 1204   O2SAT 100.0 06/28/2021 1204    CBG (last 3)  Recent Labs    06/21/21 0653 06/21/21 0756 06/21/21 0931  GLUCAP 157* 148* 150*    Critical care time: 38 minutes  Chesley Mires, MD [place] Pager - [phone] 06/21/2021, 10:15 AM

## 2021-06-21 NOTE — Progress Notes (Signed)
pt had brief episode of left facial twitching and left arm jerking, button pressed on continuous EEG monitor

## 2021-06-21 NOTE — Progress Notes (Signed)
eLink Physician-Brief Progress Note Patient Name: Julia [name] DOB: 25-Sep-1931 MRN: [id number]   Date of Service  06/21/2021  HPI/Events of Note  CTA head reviewed and unremarkable. No doppler ultrasound ordered.  eICU Interventions  See above.        Thomasene Lot Jered Heiny 06/21/2021, 9:42 PM

## 2021-06-21 NOTE — Progress Notes (Signed)
STROKE TEAM PROGRESS NOTE   SUBJECTIVE (INTERVAL HISTORY) Her RN and Dr. Craige Cotta are at the bedside. Per report, pt had brief episode of right facial twitching and right arm rhythmic jerking. Given propofol bolus and it resolved. Shortly after pt  had another brief episode and resolved spontaneously.  versed given. Per RN, earlier today pt seems to have mild left facial twitch but very brief. MRI showed right MCA scattered infarcts with b/l ACA punctate infarcts. Spot EEG yesterday no seizure. She has no hx of seizure. Will give keppra load and will do LTM EEG.    OBJECTIVE Temp:  [98.4 F (36.9 C)-100.9 F (38.3 C)] 99 F (37.2 C) (12/14 0900) Pulse Rate:  [81-95] 88 (12/14 1137) Cardiac Rhythm: Normal sinus rhythm (12/14 0800) Resp:  [15-18] 16 (12/14 1137) BP: (98-195)/(58-100) 140/64 (12/14 1137) SpO2:  [98 %-100 %] 100 % (12/14 0900) FiO2 (%):  [40 %] 40 % (12/14 1137) Weight:  [48.2 kg] 48.2 kg (12/14 0500)  Recent Labs  Lab 06/21/21 0542 06/21/21 0653 06/21/21 0756 06/21/21 0931 06/21/21 1137  GLUCAP 113* 157* 148* 150* 143*   Recent Labs  Lab Jun 26, 2021 1034 Jun 26, 2021 1034 June 26, 2021 1126 2021-06-26 1204 Jun 26, 2021 1802 2021-06-26 2126 06/21/21 0347  NA 147*  --  145 145 144 146* 147*  K 3.0*  --  2.6* 2.6* 6.9* 5.0 4.3  CL 109  --  112*  --  115* 118* 117*  CO2  --   --  15*  --  20* 22 23  GLUCOSE 377*  --  330*  --  322* 166* 153*  BUN 16  --  12  --  CREATININE 0.50  --  0.74  --  0.74 0.90 0.94  CALCIUM  --    < > 8.0*  --  9.4 9.3 9.3  MG  --   --   --   --   --   --  1.9  PHOS  --   --   --   --   --   --  1.7*   < > = values in this interval not displayed.   Recent Labs  Lab 06-26-21 1126  AST 25  ALT 13  ALKPHOS 95  BILITOT 1.1  PROT 6.6  ALBUMIN 3.1*   Recent Labs  Lab 2021/06/26 1017 06/26/21 1034 06-26-2021 1204 06/21/21 0347  WBC 17.8*  --   --  19.0*  HGB 14.4 16.7* 15.0 12.3  HCT 47.2* 49.0* 44.0 39.9  MCV 75.0*  --   --  75.1*   PLT 290  --   --  249   No results for input(s): CKTOTAL, CKMB, CKMBINDEX, TROPONINI in the last 168 hours. Recent Labs    June 26, 2021 1017  LABPROT 13.7  INR 1.1   Recent Labs    06/26/21 1017  COLORURINE YELLOW  LABSPEC 1.015  PHURINE 6.0  GLUCOSEU >=500*  HGBUR MODERATE*  BILIRUBINUR NEGATIVE  KETONESUR >80*  PROTEINUR 30*  NITRITE NEGATIVE  LEUKOCYTESUR TRACE*       Component Value Date/Time   TRIG 57 06/21/2021 0347   Lab Results  Component Value Date   HGBA1C 8.0 (H) 06-26-2021   No results found for: LABOPIA, COCAINSCRNUR, LABBENZ, AMPHETMU, THCU, LABBARB  Recent Labs  Lab 06/26/2021 1126  ETH <10    I have personally reviewed the radiological images below and agree with the radiology interpretations.  CT Head Wo Contrast  Result Date: 26-Jun-2021 CLINICAL DATA:  Level  1 trauma.  Found down. EXAM: CT HEAD WITHOUT CONTRAST CT MAXILLOFACIAL WITHOUT CONTRAST CT CERVICAL SPINE WITHOUT CONTRAST TECHNIQUE: Multidetector CT imaging of the head, cervical spine, and maxillofacial structures were performed using the standard protocol without intravenous contrast. Multiplanar CT image reconstructions of the cervical spine and maxillofacial structures were also generated. COMPARISON:  None. FINDINGS: CT HEAD FINDINGS Brain: No evidence of acute infarction, hemorrhage, hydrocephalus, extra-axial collection or mass lesion/mass effect. 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 Vascular: No hyperdense vessel or unexpected calcification. Skull: No acute fracture CT MAXILLOFACIAL FINDINGS Osseous: No fracture or mandibular dislocation. No destructive process. Orbits: No evidence of injury Sinuses: Negative for hemosinus Soft tissues: No opaque foreign body or measurable hematoma. CT CERVICAL SPINE FINDINGS Alignment: No traumatic malalignment. Skull base and vertebrae: No acute fracture. No primary bone lesion or focal pathologic process. Soft tissues and spinal  canal: No prevertebral fluid or swelling. No visible canal hematoma. Disc levels: Generalized disc collapse with endplate and facet spurring narrowing foramina diffusely, foraminal narrowing especially along the right-side of a scoliotic curvature. Upper chest: Reported separately IMPRESSION: No evidence of acute intracranial or cervical spine injury. Negative for facial fracture. Electronically Signed   By: Tiburcio Pea M.D.   On: 07/17/2021 11:07   CT Cervical Spine Wo Contrast  Result Date: 07/17/21 CLINICAL DATA:  Level 1 trauma.  Found down. EXAM: CT HEAD WITHOUT CONTRAST CT MAXILLOFACIAL WITHOUT CONTRAST CT CERVICAL SPINE WITHOUT CONTRAST TECHNIQUE: Multidetector CT imaging of the head, cervical spine, and maxillofacial structures were performed using the standard protocol without intravenous contrast. Multiplanar CT image reconstructions of the cervical spine and maxillofacial structures were also generated. COMPARISON:  None. FINDINGS: CT HEAD FINDINGS Brain: No evidence of acute infarction, hemorrhage, hydrocephalus, extra-axial collection or mass lesion/mass effect. 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 Vascular: No hyperdense vessel or unexpected calcification. Skull: No acute fracture CT MAXILLOFACIAL FINDINGS Osseous: No fracture or mandibular dislocation. No destructive process. Orbits: No evidence of injury Sinuses: Negative for hemosinus Soft tissues: No opaque foreign body or measurable hematoma. CT CERVICAL SPINE FINDINGS Alignment: No traumatic malalignment. Skull base and vertebrae: No acute fracture. No primary bone lesion or focal pathologic process. Soft tissues and spinal canal: No prevertebral fluid or swelling. No visible canal hematoma. Disc levels: Generalized disc collapse with endplate and facet spurring narrowing foramina diffusely, foraminal narrowing especially along the right-side of a scoliotic curvature. Upper chest: Reported separately  IMPRESSION: No evidence of acute intracranial or cervical spine injury. Negative for facial fracture. Electronically Signed   By: Tiburcio Pea M.D.   On: 2021/07/17 11:07   MR BRAIN WO CONTRAST  Result Date: July 17, 2021 CLINICAL DATA:  Neuro deficit, stroke suspected EXAM: MRI HEAD WITHOUT CONTRAST TECHNIQUE: Multiplanar, multiecho pulse sequences of the brain and surrounding structures were obtained without intravenous contrast. COMPARISON:  05/17/2019. FINDINGS: Brain: Areas of cortical restricted diffusion with ADC correlate in the right MCA territory (series 5, image 95, for example), with additional smaller areas of cortical restricted diffusion in the right PCA territory (series 5, image 76-7) and right ACA territory (series 5, image 97-99), with additional more punctate right MCA white matter infarcts (series 5, image 86 for example), and with additional scattered left frontal infarcts (series 5, images 88 and 92). No significant gyral swelling. No acute hemorrhage, mass, mass effect, or midline shift. Confluent T2 hyperintense signal in the periventricular white matter and pons, likely the sequela of severe chronic small vessel ischemic disease. Remote left parietal infarct.  Vascular: Normal flow voids. Skull and upper cervical spine: Choose 1 Sinuses/Orbits: Mucosal thickening in the right frontal sinus, right ethmoid air cells, right sphenoid sinus, and right maxillary sinus. Status post bilateral lens replacements. Other: None. IMPRESSION: Restricted diffusion in the right frontal parietal cortex, primarily in the right MCA territory, but also to a lesser extent in the right ACA, right PCA, and left ACA and MCA territory. This is overall concerning for an embolic etiology. No evidence of hemorrhagic conversion, mass effect, or midline shift. These results will be called to the ordering clinician or representative by the Radiologist Assistant, and communication documented in the PACS or [employer]. Electronically Signed   By: Wiliam Ke M.D.   On: 06/26/2021 23:39   DG Pelvis Portable  Result Date: 06/30/2021 CLINICAL DATA:  Trauma EXAM: PORTABLE PELVIS 1-2 VIEWS COMPARISON:  None. FINDINGS: Patient is rotated to the left which somewhat evaluation. There is no evidence of pelvic fracture or diastasis. No pelvic bone lesions are seen. Limited evaluation of the sacrum due to overlying bowel gas. IMPRESSION: No acute osseous abnormality. Electronically Signed   By: Allegra Lai M.D.   On: 06/23/2021 10:46   DG CHEST PORT 1 VIEW  Result Date: 06/21/2021 CLINICAL DATA:  Shortness of breath EXAM: PORTABLE CHEST 1 VIEW COMPARISON:  Previous studies including the examination of 06/28/2021 FINDINGS: Apparent shift of mediastinum to the left may be due to rotation. Transverse diameter of heart is within normal limits. Thoracic aorta is tortuous and ectatic. Tip of endotracheal tube is 4.1 cm above the carina. Tip of left jugular central venous catheter is seen at the junction of superior vena cava and right atrium. Tip of enteric tube is seen in the stomach. Side-port in the enteric tube is in the lower thoracic esophagus close to gastroesophageal junction. Linear densities in the right upper lung fields may suggest scarring or subsegmental atelectasis. There is no new focal pulmonary consolidation. There is no pleural effusion or pneumothorax. IMPRESSION: There are no new infiltrates or signs of pulmonary edema. Side-port in enteric tube is seen in the lower thoracic esophagus close to gastroesophageal junction. Enteric tube could be advanced 5-10 cm to place the side port within the stomach. Electronically Signed   By: Ernie Avena M.D.   On: 06/21/2021 08:08   DG Chest Portable 1 View  Result Date: 06/15/2021 CLINICAL DATA:  Central line placement EXAM: PORTABLE CHEST 1 VIEW COMPARISON:  Previous studies including examination done earlier today. FINDINGS: Cardiac size is within  normal limits. Thoracic aorta is tortuous and ectatic. Tip of endotracheal tube is approximately 3.6 cm above the carina. Enteric tube is noted traversing the esophagus with its side port at the gastroesophageal junction. There is interval placement of left internal jugular central venous catheter with its tip in the region of junction of superior vena cava and right atrium. Linear density is seen in the right upper lung fields and left mid lung fields suggesting with no significant interval change suggesting scarring or subsegmental atelectasis. There is no new focal consolidation. There are no signs of alveolar pulmonary edema. IMPRESSION: There are no new infiltrates or signs of pulmonary edema. Linear densities in the right upper lobe and left mid lung fields may suggest scarring or subsegmental atelectasis. Other findings as described in the body of the report. Electronically Signed   By: Ernie Avena M.D.   On: 06/26/2021 12:59   DG Chest Port 1 View  Result Date: 06/19/2021 CLINICAL DATA:  Trauma EXAM: PORTABLE CHEST 1 VIEW COMPARISON:  Chest x-ray dated November 02, 2020 FINDINGS: ET tube tip projects approximately 1.0 cm from the carina. Enteric tube partially seen coursing below diaphragm. Evaluation is limited due to left port patient rotation. Bilateral upper lung linear opacities are unchanged compared to prior exam likely due to scarring or atelectasis. No focal consolidation. No large pleural effusion or evidence of pneumothorax. Limited evaluation of cardiac and mediastinal contours due to patient rotation. IMPRESSION: No acute cardiopulmonary no acute cardiopulmonary abnormality, evaluation somewhat limited due to patient rotation. Electronically Signed   By: Allegra Lai M.D.   On: 06/18/2021 10:44   EEG adult  Result Date: 06/17/2021 Charlsie Quest, MD     06/14/2021  2:29 PM Patient Name: MANIKA HAST MRN: [id number] Epilepsy Attending: Charlsie Quest Referring  Physician/Provider: Dr Levon Hedger Date: 06/25/2021 Duration: 24.16 mins Patient history: 87yo F with ams. EEG to evaluate for seizure Level of alertness: comatose AEDs during EEG study: Propofol Technical aspects: This EEG study was done with scalp electrodes positioned according to the 10-20 International system of electrode placement. Electrical activity was acquired at a sampling rate of 500Hz  and reviewed with a high frequency filter of 70Hz  and a low frequency filter of 1Hz . EEG data were recorded continuously and digitally stored. Description: EEG showed continuous low amplitude 2-3hz  delta slowing in right hemisphere. There was also predominantly 5-8Hz  theta-alpha activity in left hemisphere admixed with intermittent 2-3hz  delta slowing. Hyperventilation and photic stimulation were not performed.   ABNORMALITY - Continuous slow, generalized and lateralized right hemisphere IMPRESSION: This study is suggestive of cortical dysfunction arising from right hemisphere, nonspecific etiology but likely secondary to underlying structural abnormality, post-ictal state. Additionally there is moderate diffuse encephalopathy, nonspecific etiology but likely could be secondary to sedation. No seizures or epileptiform discharges were seen throughout the recording. Dr was notified. Priyanka   CT CHEST ABDOMEN PELVIS WO CONTRAST  Result Date: 06/26/2021 CLINICAL DATA:  Level 1 trauma. Fall. EXAM: CT CHEST, ABDOMEN AND PELVIS WITHOUT CONTRAST TECHNIQUE: Multidetector CT imaging of the chest, abdomen and pelvis was performed following the standard protocol without IV contrast. COMPARISON:  None similar FINDINGS: CT CHEST FINDINGS Cardiovascular: Normal heart size. Trace anterior pericardial effusion which appears low-density. Coronary atherosclerosis. Mediastinum/Nodes: Endotracheal and enteric tubes are in unremarkable position. No hematoma or pneumomediastinum. 3.2 cm left thyroid nodule, unchanged from  2020. This was evaluated by ultrasound in 2019. No follow-up recommended unless clinically warranted (ref: J Am Coll Radiol. 2015 Feb;12(2): 143-50). Lungs/Pleura: Bands of scarring in the upper lungs with mild, cylindrical left upper lobe bronchiectasis. Prior left apical surgery with lung sutures. No hemothorax, pneumothorax, or lung contusion. Musculoskeletal: Scoliosis.  No acute fracture or subluxation. CT ABDOMEN PELVIS FINDINGS Hepatobiliary: No hepatic injury or perihepatic hematoma. Gallbladder is unremarkable Pancreas: Unremarkable Spleen: No splenic injury or perisplenic hematoma. Adrenals/Urinary Tract: No adrenal hemorrhage or renal injury identified. Bladder is unremarkable. Bilateral nephrolithiasis. Adjacent or lobulated right lower pole calculus in total measuring 9 mm. 3 mm interpolar left renal calculus. Stomach/Bowel: No evidence of injury. Rectal stool distention. The enteric tube tip is at the stomach. Vascular/Lymphatic: Stranding around the right femoral vessels related to phlebotomy per report. Atheromatous calcification. Reproductive: Hysterectomy Other: No ascites or pneumoperitoneum Musculoskeletal: Scoliosis and ordinary degeneration for age. No acute fracture or subluxation. IMPRESSION: No evidence of acute intrathoracic or intra-abdominal injury. Electronically Signed   By: Annabelle Harman M.D.   On: 06/14/2021 11:05  CT Maxillofacial Wo Contrast  Result Date: 06/19/2021 CLINICAL DATA:  Level 1 trauma.  Found down. EXAM: CT HEAD WITHOUT CONTRAST CT MAXILLOFACIAL WITHOUT CONTRAST CT CERVICAL SPINE WITHOUT CONTRAST TECHNIQUE: Multidetector CT imaging of the head, cervical spine, and maxillofacial structures were performed using the standard protocol without intravenous contrast. Multiplanar CT image reconstructions of the cervical spine and maxillofacial structures were also generated. COMPARISON:  None. FINDINGS: CT HEAD FINDINGS Brain: No evidence of acute infarction, hemorrhage,  hydrocephalus, extra-axial collection or mass lesion/mass effect. 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 Vascular: No hyperdense vessel or unexpected calcification. Skull: No acute fracture CT MAXILLOFACIAL FINDINGS Osseous: No fracture or mandibular dislocation. No destructive process. Orbits: No evidence of injury Sinuses: Negative for hemosinus Soft tissues: No opaque foreign body or measurable hematoma. CT CERVICAL SPINE FINDINGS Alignment: No traumatic malalignment. Skull base and vertebrae: No acute fracture. No primary bone lesion or focal pathologic process. Soft tissues and spinal canal: No prevertebral fluid or swelling. No visible canal hematoma. Disc levels: Generalized disc collapse with endplate and facet spurring narrowing foramina diffusely, foraminal narrowing especially along the right-side of a scoliotic curvature. Upper chest: Reported separately IMPRESSION: No evidence of acute intracranial or cervical spine injury. Negative for facial fracture. Electronically Signed   By: Tiburcio Pea M.D.   On: 07/06/2021 11:07     PHYSICAL EXAM  Temp:  [98.4 F (36.9 C)-100.9 F (38.3 C)] 99 F (37.2 C) (12/14 0900) Pulse Rate:  [81-95] 88 (12/14 1137) Resp:  [15-18] 16 (12/14 1137) BP: (98-195)/(58-100) 140/64 (12/14 1137) SpO2:  [98 %-100 %] 100 % (12/14 0900) FiO2 (%):  [40 %] 40 % (12/14 1137) Weight:  [48.2 kg] 48.2 kg (12/14 0500)  General - Well nourished, well developed, intubated on sedation.  Ophthalmologic - fundi not visualized due to noncooperation.  Cardiovascular - Regular rate and rhythm.  Neuro - intubated on sedation, eyes closed, not following commands. With forced eye opening, eyes in mid position, not blinking to visual threat, doll's eyes absent, not tracking, right pinpoint pupil, left pupil pinpoint but irregular shape with evidence of prior ophthalmic surgery. Corneal reflex weakly present R>L, gag and cough weakly present. Breathing  over the vent.  Facial symmetry not able to test due to ET tube.  Tongue protrusion not cooperative. On pain stimulation, no significant movement of all extremities. No babinski. Sensation, coordination and gait not tested.   ASSESSMENT/PLAN Ms. Julia [name] is a 87 y.o. female with history of cerebral palsy, hypertension, diabetes, sarcoidosis admitted for #home with facial trauma, altered mental status. No tPA given due to unknown last known well.    Stroke:  right MCA and ACA infarcts embolic pattern secondary to unclear source, concerning for occult A. fib CT no acute abnormality MRI right MCA, MCA/PCA and MCA/ACA scattered infarcts CT head and neck unremarkable 2D Echo pending LE venous Doppler pending Will consider loop recorder versus surgery CardioNet monitoring for cardioembolic work-up LDL 25 HgbA1c 8.0 Heparin subcu for VTE prophylaxis aspirin 81 mg daily prior to admission, now on aspirin 325 mg daily.  Ongoing aggressive stroke risk factor management Therapy recommendations: Pending Disposition: Pending  ? Seizure Reported right facial twitching and right arm jerking movement Received Versed and propofol bolus EEG 12/13 cortical dysfunction arising from right hemisphere, nonspecific etiology  Keppra load followed by maintenance LTM EEG requested  Fever Leukocytosis  Spiking fever 12/13, T-max 100.9 Today afebrile WBC 17.8-19.0 UA WBC 20-51 CXR unremarkable Blood culture pending On cefepime ->  zosyn  Diabetes, uncontrolled HgbA1c 8.0 goal < 7.0 Uncontrolled Currently on Levemir 5 U twice daily CBG monitoring SSI DM education and close PCP follow up  Respiratory failure Intubated on sedation CCM on board Not candidate for extubation today  Hypertension Stable Long term BP goal normotensive  Other Stroke Risk Factors Advanced age  Other Active Problems Cerebral palsy Sarcoidosis  Hospital day # 1  This patient is critically ill due to right  MCA, ACA infarcts, respiratory failure, seizure, hypoglycemia, UTI, leukocytosis and fever and at significant risk of neurological worsening, death form recurrent stroke, hemorrhagic conversion, sepsis, DKA, status epilepticus. This patient's care requires constant monitoring of vital signs, hemodynamics, respiratory and cardiac monitoring, review of multiple databases, neurological assessment, discussion with family, other specialists and medical decision making of high complexity. I spent 35 minutes of neurocritical care time in the care of this patient.  I discussed with CCM Dr. Craige Cotta.   Marvel Plan, MD PhD Stroke Neurology 06/21/2021 11:49 AM    To contact Stroke Continuity provider, please refer to [email or web address]. After hours, contact General Neurology

## 2021-06-21 NOTE — Progress Notes (Signed)
LTM EEG hooked up and running - no initial skin breakdown - push button tested - neuro notified. [facility] monitoring.  

## 2021-06-21 NOTE — Progress Notes (Signed)
Initial Nutrition Assessment  DOCUMENTATION CODES:   Non-severe (moderate) malnutrition in context of chronic illness  INTERVENTION:   Initiate tube feeds via OG tube: - Start Vital AF 1.2 @ 20 ml/hr and advance by 10 ml q 6 hours to goal rate of 45 ml/hr (1080 ml/day)  Tube feeding regimen at goal provides 1296 kcal, 81 grams of protein, and 876 ml of H2O.   Tube feeding regimen at goal and current propofol provides 1391 total kcal (100% of needs).  Monitor magnesium, potassium, and phosphorus BID for at least 3 days, MD to replete as needed, as pt is at risk for refeeding syndrome given malnutrition.  NUTRITION DIAGNOSIS:   Moderate Malnutrition related to chronic illness (CP, renal insufficiency, prior stroke) as evidenced by moderate fat depletion, severe muscle depletion.  GOAL:   Patient will meet greater than or equal to 90% of their needs  MONITOR:   Vent status, Labs, Weight trends, TF tolerance  REASON FOR ASSESSMENT:   Ventilator, Consult Assessment of nutrition requirement/status  ASSESSMENT:   87 year old [name] who presented to the ED after being found down and unresponsive by family. PMH of UTI, rhabdomyolysis, CP, renal insufficiency, stroke, TIA, HTN, T2DM, GERD, esophageal stricture. Pt required intubation. Pt found to have embolic CVA.  Discussed pt with RN and during ICU rounds.  Consult received for tube feeding initiation and management. Pt with OG tube in stomach per x-ray today.  No weight history available in chart. Will attempt to obtain diet and weight history at follow-up. Pt meets criteria for moderate malnutrition based on NFPE.  Patient is currently intubated on ventilator support MV: 6.6 L/min Temp (24hrs), Avg:99.6 F (37.6 C), Min:98.4 F (36.9 C), Max:100.9 F (38.3 C)  Drips: Propofol: 3.6 ml/hr (provides 95 kcal daily from lipid) Fentanyl  Medications reviewed and include: SSI q 4 hours, levemir 5 units BID, protonix,  phos-nak 2 packets once, IV abx, IV potassium phosphate 15 mmol once  Labs reviewed: sodium 147, phosphorus 1.7, WBC 19.0 CBG's: 113-386 x 24 hours (trending down)  UOP: 520 ml x 12 hours I/O's: +2.0 L since admit  NUTRITION - FOCUSED PHYSICAL EXAM:  Flowsheet Row Most Recent Value  Orbital Region Moderate depletion  Upper Arm Region Moderate depletion  Thoracic and Lumbar Region Moderate depletion  Buccal Region Unable to assess  Temple Region Moderate depletion  Clavicle Bone Region Severe depletion  Clavicle and Acromion Bone Region Severe depletion  Scapular Bone Region Unable to assess  Dorsal Hand Moderate depletion  Patellar Region Severe depletion  Anterior Thigh Region Severe depletion  Posterior Calf Region Severe depletion  Edema (RD Assessment) Mild  [BUE]  Hair Reviewed  Eyes Unable to assess  Mouth Unable to assess  Skin Reviewed  Nails Reviewed       Diet Order:   Diet Order             Diet NPO time specified  Diet effective now                   EDUCATION NEEDS:   Not appropriate for education at this time  Skin:  Skin Assessment: Reviewed RN Assessment  Last BM:  no documented BM  Height:   Ht Readings from Last 1 Encounters:  01/12/Julia [name] 5\' 2"  (1.575 m)    Weight:   Wt Readings from Last 1 Encounters:  06/21/21 48.2 kg    BMI:  Body mass index is 19.44 kg/m.  Estimated Nutritional Needs:   Kcal:  1300-1500  Protein:  65-80 grams  Fluid:  1.3-1.5 L    Julia [name] Clause, MS, RD, LDN Inpatient Clinical Dietitian Please see [facility] for contact information.

## 2021-06-21 NOTE — Progress Notes (Addendum)
Developed seizure activity involving face and right side.  Lasted about 2 minutes and resolved after receiving diprivan bolus.  D/w neurology.  Updated pt's son by phone Favour Aleshire [phone]).  Coralyn Helling, MD [facility] Pulmonary/Critical Care Pager - [phone] 06/21/2021, 11:21 AM

## 2021-06-21 NOTE — Progress Notes (Signed)
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  eICU Interventions  Stat neurology consultation  (I spoke with the on call neurologist).        Julia [name] 06/21/2021, 12:38 AM

## 2021-06-22 ENCOUNTER — Inpatient Hospital Stay (HOSPITAL_COMMUNITY): Payer: Medicare Other

## 2021-06-22 DIAGNOSIS — J9602 Acute respiratory failure with hypercapnia: Secondary | ICD-10-CM

## 2021-06-22 DIAGNOSIS — I63411 Cerebral infarction due to embolism of right middle cerebral artery: Principal | ICD-10-CM

## 2021-06-22 DIAGNOSIS — I639 Cerebral infarction, unspecified: Secondary | ICD-10-CM

## 2021-06-22 DIAGNOSIS — R569 Unspecified convulsions: Secondary | ICD-10-CM

## 2021-06-22 DIAGNOSIS — J9601 Acute respiratory failure with hypoxia: Secondary | ICD-10-CM

## 2021-06-22 LAB — BASIC METABOLIC PANEL
Anion gap: 10 (ref 5–15)
BUN: 16 mg/dL (ref 8–23)
CO2: 22 mmol/L (ref 22–32)
Calcium: 8.2 mg/dL — ABNORMAL LOW (ref 8.9–10.3)
Chloride: 114 mmol/L — ABNORMAL HIGH (ref 98–111)
Creatinine, Ser: 0.81 mg/dL (ref 0.44–1.00)
GFR, Estimated: >60 mL/min (ref >60)
Glucose, Bld: 285 mg/dL — ABNORMAL HIGH (ref 70–99)
Potassium: 3.7 mmol/L (ref 3.5–5.1)
Sodium: 146 mmol/L — ABNORMAL HIGH (ref 135–145)

## 2021-06-22 LAB — CBC
HCT: 31.8 % — ABNORMAL LOW (ref 36.0–46.0)
Hemoglobin: 9.8 g/dL — ABNORMAL LOW (ref 12.0–15.0)
MCH: 23.1 pg — ABNORMAL LOW (ref 26.0–34.0)
MCHC: 30.8 g/dL (ref 30.0–36.0)
MCV: 75 fL — ABNORMAL LOW (ref 80.0–100.0)
Platelets: 176 10*3/uL (ref 150–400)
RBC: 4.24 MIL/uL (ref 3.87–5.11)
RDW: 16.8 % — ABNORMAL HIGH (ref 11.5–15.5)
WBC: 14.2 10*3/uL — ABNORMAL HIGH (ref 4.0–10.5)
nRBC: 0 % (ref 0.0–0.2)

## 2021-06-22 LAB — GLUCOSE, CAPILLARY
Glucose-Capillary: 137 mg/dL — ABNORMAL HIGH (ref 70–99)
Glucose-Capillary: 177 mg/dL — ABNORMAL HIGH (ref 70–99)
Glucose-Capillary: 205 mg/dL — ABNORMAL HIGH (ref 70–99)
Glucose-Capillary: 215 mg/dL — ABNORMAL HIGH (ref 70–99)
Glucose-Capillary: 220 mg/dL — ABNORMAL HIGH (ref 70–99)
Glucose-Capillary: 235 mg/dL — ABNORMAL HIGH (ref 70–99)

## 2021-06-22 LAB — MAGNESIUM: Magnesium: 1.5 mg/dL — ABNORMAL LOW (ref 1.7–2.4)

## 2021-06-22 LAB — PHOSPHORUS: Phosphorus: 3.9 mg/dL (ref 2.5–4.6)

## 2021-06-22 MED ORDER — LEVETIRACETAM IN NACL 1500 MG/100ML IV SOLN
1500.0000 mg | Freq: Two times a day (BID) | INTRAVENOUS | Status: DC
  Administered 2021-06-22 – 2021-06-29 (×15): 1500 mg via INTRAVENOUS
  Filled 2021-06-22 (×17): qty 100

## 2021-06-22 MED ORDER — INSULIN DETEMIR 100 UNIT/ML ~~LOC~~ SOLN
10.0000 [IU] | Freq: Two times a day (BID) | SUBCUTANEOUS | Status: DC
  Administered 2021-06-22: 10 [IU] via SUBCUTANEOUS
  Filled 2021-06-22 (×3): qty 0.1

## 2021-06-22 MED ORDER — SODIUM CHLORIDE 0.9 % IV BOLUS
1000.0000 mL | Freq: Once | INTRAVENOUS | Status: AC
  Administered 2021-06-22: 1000 mL via INTRAVENOUS

## 2021-06-22 MED ORDER — VALPROATE SODIUM 100 MG/ML IV SOLN
500.0000 mg | Freq: Three times a day (TID) | INTRAVENOUS | Status: DC
  Administered 2021-06-22 – 2021-06-24 (×5): 500 mg via INTRAVENOUS
  Filled 2021-06-22 (×7): qty 5

## 2021-06-22 MED ORDER — MAGNESIUM SULFATE 4 GM/100ML IV SOLN
4.0000 g | Freq: Once | INTRAVENOUS | Status: AC
  Administered 2021-06-22: 4 g via INTRAVENOUS
  Filled 2021-06-22: qty 100

## 2021-06-22 MED ORDER — VALPROATE SODIUM 100 MG/ML IV SOLN
1000.0000 mg | Freq: Once | INTRAVENOUS | Status: AC
  Administered 2021-06-22: 1000 mg via INTRAVENOUS
  Filled 2021-06-22: qty 10

## 2021-06-22 NOTE — TOC Progression Note (Signed)
Transition of Care [facility]) - Initial/Assessment Note    Patient Details  Name: Julia [name] MRN: [id number] Date of Birth: Mar 09, 1932  [facility] CM/SW Contact:    Ralene Bathe, LCSWA Phone Number: 06/22/2021, 4:29 PM  Clinical Narrative:                  Transition of Care Department [facility]) has reviewed patient and no TOC needs have been identified at this time.  Patient currently on ventilator.  We will continue to monitor patient advancement through interdisciplinary progression rounds. If new patient transition needs arise, please place a TOC consult.         Patient Goals and CMS Choice        Expected Discharge Plan and Services                                                Prior Living Arrangements/Services                       Activities of Daily Living      Permission Sought/Granted                  Emotional Assessment              Admission diagnosis:  Coma (HCC) [R40.20] Trauma [T14.90XA] History of ETT [Z92.89] Unresponsiveness [R41.89] Injury of head, initial encounter [S09.90XA] Patient Active Problem List   Diagnosis Date Noted   Cerebral embolism with cerebral infarction 06/21/2021   Malnutrition of moderate degree 06/21/2021   Coma (HCC) Jun 26, 2021   PCP:  Gwenyth Bender, MD Pharmacy:  No Pharmacies Listed    Social Determinants of Health (SDOH) Interventions    Readmission Risk Interventions No flowsheet data found.

## 2021-06-22 NOTE — Progress Notes (Signed)
Mg 1.5 Replaced per protocol  

## 2021-06-22 NOTE — Progress Notes (Signed)
Updated pt's son, Alecia Lemming, at bedside.  Discussed current status and treatment plan.  Alecia Lemming informed me that his brother called the ICU yesterday to get an updated, but informed the staff that he was Alecia Lemming.    The correct contact number for Alecia Lemming is [phone].  Coralyn Helling, MD [facility] Pulmonary/Critical Care Pager - [phone] 06/22/2021, 1:47 PM

## 2021-06-22 NOTE — Progress Notes (Signed)
STROKE TEAM PROGRESS NOTE   SUBJECTIVE (INTERVAL HISTORY) No family is at the bedside. Pt still intubated, on sedation, not responsive. On LTM EEG, it showed several brief seizures before midnight, and resolved after ativan given around midnight. Will maximize the keppra and add depakote. LE venous doppler negative for DVT.    OBJECTIVE Temp:  [96.6 F (35.9 C)-99.5 F (37.5 C)] 99.5 F (37.5 C) (12/15 1544) Pulse Rate:  [66-173] 79 (12/15 1501) Cardiac Rhythm: Normal sinus rhythm (12/15 1400) Resp:  [15-23] 16 (12/15 1500) BP: (95-156)/(42-119) 120/49 (12/15 1500) SpO2:  [79 %-100 %] 100 % (12/15 1500) FiO2 (%):  [40 %] 40 % (12/15 1501) Weight:  [53.4 kg] 53.4 kg (12/15 0441)  Recent Labs  Lab 06/21/21 2327 06/22/21 0336 06/22/21 0741 06/22/21 1133 06/22/21 1543  GLUCAP 196* 137* 177* 220* 235*   Recent Labs  Lab 07/03/2021 1126 06/30/2021 1204 07/03/2021 1802 06/22/2021 2126 06/21/21 0347 06/22/21 0437  NA 145 145 144 146* 147* 146*  K 2.6* 2.6* 6.9* 5.0 4.3 3.7  CL 112*  --  115* 118* 117* 114*  CO2 15*  --  20* 22 23 22   GLUCOSE 330*  --  322* 166* 153* 285*  BUN 12  --  16 18 18 16   CREATININE 0.74  --  0.74 0.90 0.94 0.81  CALCIUM 8.0*  --  9.4 9.3 9.3 8.2*  MG  --   --   --   --  1.9 1.5*  PHOS  --   --   --   --  1.7* 3.9   Recent Labs  Lab 07/05/2021 1126  AST 25  ALT 13  ALKPHOS 95  BILITOT 1.1  PROT 6.6  ALBUMIN 3.1*   Recent Labs  Lab 07/03/2021 1017 06/26/2021 1034 06/08/2021 1204 06/21/21 0347 06/22/21 0437  WBC 17.8*  --   --  19.0* 14.2*  HGB 14.4 16.7* 15.0 12.3 9.8*  HCT 47.2* 49.0* 44.0 39.9 31.8*  MCV 75.0*  --   --  75.1* 75.0*  PLT 290  --   --  249 176   No results for input(s): CKTOTAL, CKMB, CKMBINDEX, TROPONINI in the last 168 hours. Recent Labs    06/13/2021 1017  LABPROT 13.7  INR 1.1   Recent Labs    06/30/2021 1017  COLORURINE YELLOW  LABSPEC 1.015  PHURINE 6.0  GLUCOSEU >=500*  HGBUR MODERATE*  BILIRUBINUR NEGATIVE   KETONESUR >80*  PROTEINUR 30*  NITRITE NEGATIVE  LEUKOCYTESUR TRACE*       Component Value Date/Time   CHOL 94 06/21/2021 1114   TRIG 41 06/21/2021 1114   HDL 61 06/21/2021 1114   CHOLHDL 1.5 06/21/2021 1114   VLDL 8 06/21/2021 1114   LDLCALC 25 06/21/2021 1114   Lab Results  Component Value Date   HGBA1C 8.0 (H) 06/28/2021   No results found for: LABOPIA, COCAINSCRNUR, LABBENZ, AMPHETMU, THCU, LABBARB  Recent Labs  Lab 06/24/2021 1126  ETH <10    I have personally reviewed the radiological images below and agree with the radiology interpretations.  CT ANGIO HEAD NECK W WO CM  Result Date: 06/21/2021 CLINICAL DATA:  Stroke/TIA, determine embolic source EXAM: CT ANGIOGRAPHY HEAD AND NECK TECHNIQUE: Multidetector CT imaging of the head and neck was performed using the standard protocol during bolus administration of intravenous contrast. Multiplanar CT image reconstructions and MIPs were obtained to evaluate the vascular anatomy. Carotid stenosis measurements (when applicable) are obtained utilizing NASCET criteria, using the distal internal carotid diameter  as the denominator. CONTRAST:  3mL OMNIPAQUE IOHEXOL 350 MG/ML SOLN COMPARISON:  CT and MRI brain 2021-07-11; MRA head 2015 FINDINGS: CT HEAD Brain: There is no acute intracranial hemorrhage. There is new loss of gray-white differentiation corresponding to some of the areas of infarction seen on the MRI, particularly in the right hemisphere. There is no mass effect. No hydrocephalus. Stable chronic infarcts and chronic microvascular ischemic changes. Vascular: No new finding. Skull: Calvarium is unremarkable. Sinuses/Orbits: No acute finding. Other: None. Review of the MIP images confirms the above findings CTA NECK Aortic arch: Mild calcified plaque along the arch. Great vessel origins appear. Right carotid system: Patent.  No stenosis. Left carotid system: Patent. Plaque at the ICA origin causes less than 50% stenosis. Vertebral  arteries: Patent.  Codominant.  No stenosis. Skeleton: Degenerative changes of the included spine. Other neck: Enlarged, heterogeneous thyroid previously evaluated by ultrasound. Endotracheal and enteric tubes are present Upper chest: Scarring with traction bronchiectasis. Review of the MIP images confirms the above findings CTA HEAD Anterior circulation: Intracranial internal carotid arteries are patent with calcified plaque causing mild stenosis. Anterior and middle cerebral arteries are patent. Atherosclerotic irregularity of distal branches. Posterior circulation: Intracranial vertebral arteries are patent. Basilar artery is patent. Major cerebellar artery origins are patent. Right posterior communicating artery is present. Posterior cerebral arteries are patent. Venous sinuses: Patent as allowed by contrast bolus timing. Review of the MIP images confirms the above findings IMPRESSION: No large vessel occlusion or hemodynamically significant stenosis in the neck. No proximal intracranial vessel occlusion. Atherosclerotic irregularity of medium to small vessel branches in the anterior circulation. Electronically Signed   By: Guadlupe Spanish M.D.   On: 06/21/2021 14:48   DG Abd 1 View  Result Date: 06/21/2021 CLINICAL DATA:  NG tube placement. EXAM: ABDOMEN - 1 VIEW COMPARISON:  CT scan 11-Jul-2021 FINDINGS: The bowel gas pattern is unremarkable. The NG tube tip is in the antropyloric region of the stomach. IMPRESSION: NG tube tip is in the antropyloric region of the stomach. Electronically Signed   By: Rudie Meyer M.D.   On: 06/21/2021 13:37   CT Head Wo Contrast  Result Date: 11-Jul-2021 CLINICAL DATA:  Level 1 trauma.  Found down. EXAM: CT HEAD WITHOUT CONTRAST CT MAXILLOFACIAL WITHOUT CONTRAST CT CERVICAL SPINE WITHOUT CONTRAST TECHNIQUE: Multidetector CT imaging of the head, cervical spine, and maxillofacial structures were performed using the standard protocol without intravenous contrast.  Multiplanar CT image reconstructions of the cervical spine and maxillofacial structures were also generated. COMPARISON:  None. FINDINGS: CT HEAD FINDINGS Brain: No evidence of acute infarction, hemorrhage, hydrocephalus, extra-axial collection or mass lesion/mass effect. 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 Vascular: No hyperdense vessel or unexpected calcification. Skull: No acute fracture CT MAXILLOFACIAL FINDINGS Osseous: No fracture or mandibular dislocation. No destructive process. Orbits: No evidence of injury Sinuses: Negative for hemosinus Soft tissues: No opaque foreign body or measurable hematoma. CT CERVICAL SPINE FINDINGS Alignment: No traumatic malalignment. Skull base and vertebrae: No acute fracture. No primary bone lesion or focal pathologic process. Soft tissues and spinal canal: No prevertebral fluid or swelling. No visible canal hematoma. Disc levels: Generalized disc collapse with endplate and facet spurring narrowing foramina diffusely, foraminal narrowing especially along the right-side of a scoliotic curvature. Upper chest: Reported separately IMPRESSION: No evidence of acute intracranial or cervical spine injury. Negative for facial fracture. Electronically Signed   By: Tiburcio Pea M.D.   On: 2021-07-11 11:07   CT Cervical Spine Wo Contrast  Result Date: 12-Jul-2021 CLINICAL DATA:  Level 1 trauma.  Found down. EXAM: CT HEAD WITHOUT CONTRAST CT MAXILLOFACIAL WITHOUT CONTRAST CT CERVICAL SPINE WITHOUT CONTRAST TECHNIQUE: Multidetector CT imaging of the head, cervical spine, and maxillofacial structures were performed using the standard protocol without intravenous contrast. Multiplanar CT image reconstructions of the cervical spine and maxillofacial structures were also generated. COMPARISON:  None. FINDINGS: CT HEAD FINDINGS Brain: No evidence of acute infarction, hemorrhage, hydrocephalus, extra-axial collection or mass lesion/mass effect. Generalized  atrophy with remote left parietooccipital infarct. Vascular: No hyperdense vessel or unexpected calcification. Skull: No acute fracture CT MAXILLOFACIAL FINDINGS Osseous: No fracture or mandibular dislocation. No destructive process. Orbits: No evidence of injury Sinuses: Negative for hemosinus Soft tissues: No opaque foreign body or measurable hematoma. CT CERVICAL SPINE FINDINGS Alignment: No traumatic malalignment. Skull base and vertebrae: No acute fracture. No primary bone lesion or focal pathologic process. Soft tissues and spinal canal: No prevertebral fluid or swelling. No visible canal hematoma. Disc levels: Generalized disc collapse with endplate and facet spurring narrowing foramina diffusely, foraminal narrowing especially along the right-side of a scoliotic curvature. Upper chest: Reported separately IMPRESSION: No evidence of acute intracranial or cervical spine injury. Negative for facial fracture. Electronically Signed   By: Tiburcio Pea M.D.   On: 12-Jul-2021 11:07   MR BRAIN WO CONTRAST  Result Date: July 12, 2021 CLINICAL DATA:  Neuro deficit, stroke suspected EXAM: MRI HEAD WITHOUT CONTRAST TECHNIQUE: Multiplanar, multiecho pulse sequences of the brain and surrounding structures were obtained without intravenous contrast. COMPARISON:  05/17/2019. FINDINGS: Brain: Areas of cortical restricted diffusion with ADC correlate in the right MCA territory (series 5, image 95, for example), with additional smaller areas of cortical restricted diffusion in the right PCA territory (series 5, image 76-7) and right ACA territory (series 5, image 97-99), with additional more punctate right MCA white matter infarcts (series 5, image 86 for example), and with additional scattered left frontal infarcts (series 5, images 88 and 92). No significant gyral swelling. No acute hemorrhage, mass, mass effect, or midline shift. Confluent T2 hyperintense signal in the periventricular white matter and pons, likely the  sequela of severe chronic small vessel ischemic disease. Remote left parietal infarct. Vascular: Normal flow voids. Skull and upper cervical spine: Choose 1 Sinuses/Orbits: Mucosal thickening in the right frontal sinus, right ethmoid air cells, right sphenoid sinus, and right maxillary sinus. Status post bilateral lens replacements. Other: None. IMPRESSION: Restricted diffusion in the right frontal parietal cortex, primarily in the right MCA territory, but also to a lesser extent in the right ACA, right PCA, and left ACA and MCA territory. This is overall concerning for an embolic etiology. No evidence of hemorrhagic conversion, mass effect, or midline shift. These results will be called to the ordering clinician or representative by the Radiologist Assistant, and communication documented in the PACS or [employer]. Electronically Signed   By: Wiliam Ke M.D.   On: 07-12-21 23:39   DG Pelvis Portable  Result Date: 2021-07-12 CLINICAL DATA:  Trauma EXAM: PORTABLE PELVIS 1-2 VIEWS COMPARISON:  None. FINDINGS: Patient is rotated to the left which somewhat evaluation. There is no evidence of pelvic fracture or diastasis. No pelvic bone lesions are seen. Limited evaluation of the sacrum due to overlying bowel gas. IMPRESSION: No acute osseous abnormality. Electronically Signed   By: Allegra Lai M.D.   On: 07-12-2021 10:46   DG CHEST PORT 1 VIEW  Result Date: 06/21/2021 CLINICAL DATA:  Shortness of breath EXAM: PORTABLE CHEST 1 VIEW  COMPARISON:  Previous studies including the examination of 2021-07-16 FINDINGS: Apparent shift of mediastinum to the left may be due to rotation. Transverse diameter of heart is within normal limits. Thoracic aorta is tortuous and ectatic. Tip of endotracheal tube is 4.1 cm above the carina. Tip of left jugular central venous catheter is seen at the junction of superior vena cava and right atrium. Tip of enteric tube is seen in the stomach. Side-port in the enteric  tube is in the lower thoracic esophagus close to gastroesophageal junction. Linear densities in the right upper lung fields may suggest scarring or subsegmental atelectasis. There is no new focal pulmonary consolidation. There is no pleural effusion or pneumothorax. IMPRESSION: There are no new infiltrates or signs of pulmonary edema. Side-port in enteric tube is seen in the lower thoracic esophagus close to gastroesophageal junction. Enteric tube could be advanced 5-10 cm to place the side port within the stomach. Electronically Signed   By: Ernie Avena M.D.   On: 06/21/2021 08:08   DG Chest Portable 1 View  Result Date: Jul 16, 2021 CLINICAL DATA:  Central line placement EXAM: PORTABLE CHEST 1 VIEW COMPARISON:  Previous studies including examination done earlier today. FINDINGS: Cardiac size is within normal limits. Thoracic aorta is tortuous and ectatic. Tip of endotracheal tube is approximately 3.6 cm above the carina. Enteric tube is noted traversing the esophagus with its side port at the gastroesophageal junction. There is interval placement of left internal jugular central venous catheter with its tip in the region of junction of superior vena cava and right atrium. Linear density is seen in the right upper lung fields and left mid lung fields suggesting with no significant interval change suggesting scarring or subsegmental atelectasis. There is no new focal consolidation. There are no signs of alveolar pulmonary edema. IMPRESSION: There are no new infiltrates or signs of pulmonary edema. Linear densities in the right upper lobe and left mid lung fields may suggest scarring or subsegmental atelectasis. Other findings as described in the body of the report. Electronically Signed   By: Ernie Avena M.D.   On: 2021/07/16 12:59   DG Chest Port 1 View  Result Date: July 16, 2021 CLINICAL DATA:  Trauma EXAM: PORTABLE CHEST 1 VIEW COMPARISON:  Chest x-ray dated November 02, 2020 FINDINGS: ET tube  tip projects approximately 1.0 cm from the carina. Enteric tube partially seen coursing below diaphragm. Evaluation is limited due to left port patient rotation. Bilateral upper lung linear opacities are unchanged compared to prior exam likely due to scarring or atelectasis. No focal consolidation. No large pleural effusion or evidence of pneumothorax. Limited evaluation of cardiac and mediastinal contours due to patient rotation. IMPRESSION: No acute cardiopulmonary no acute cardiopulmonary abnormality, evaluation somewhat limited due to patient rotation. Electronically Signed   By: Allegra Lai M.D.   On: 16-Jul-2021 10:44   EEG adult  Result Date: 2021/07/16 Charlsie Quest, MD     2021/07/16  2:29 PM Patient Name: Julia [name] MRN: [id number] Epilepsy Attending: Charlsie Quest Referring Physician/Provider: Dr Levon Hedger Date: 07-16-21 Duration: 24.16 mins Patient history: 87yo F with ams. EEG to evaluate for seizure Level of alertness: comatose AEDs during EEG study: Propofol Technical aspects: This EEG study was done with scalp electrodes positioned according to the 10-20 International system of electrode placement. Electrical activity was acquired at a sampling rate of  and reviewed with a high frequency filter of  and a low frequency filter of . EEG data were recorded continuously and  digitally stored. Description: EEG showed continuous low amplitude 2-3hz  delta slowing in right hemisphere. There was also predominantly 5-8Hz  theta-alpha activity in left hemisphere admixed with intermittent 2-3hz  delta slowing. Hyperventilation and photic stimulation were not performed.   ABNORMALITY - Continuous slow, generalized and lateralized right hemisphere IMPRESSION: This study is suggestive of cortical dysfunction arising from right hemisphere, nonspecific etiology but likely secondary to underlying structural abnormality, post-ictal state. Additionally there is moderate diffuse  encephalopathy, nonspecific etiology but likely could be secondary to sedation. No seizures or epileptiform discharges were seen throughout the recording. Dr Wilford Corner was notified. Priyanka Annabelle Harman   Overnight EEG with video  Result Date: 06/22/2021 Charlsie Quest, MD     06/22/2021 10:07 AM Patient Name: BRIANNON BOGGIO MRN: [id number] Epilepsy Attending: Charlsie Quest Referring Physician/Provider: Dr Marvel Plan Duration: 06/21/2021 1445 to 06/22/2021 1000  Patient history: 87yo F with ams. EEG to evaluate for seizure  Level of alertness: comatose  AEDs during EEG study: Propofol, LEV  Technical aspects: This EEG study was done with scalp electrodes positioned according to the 10-20 International system of electrode placement. Electrical activity was acquired at a sampling rate of 500Hz  and reviewed with a high frequency filter of 70Hz  and a low frequency filter of 1Hz . EEG data were recorded continuously and digitally stored.  Description: EEG initially showed continuous low amplitude 2-3hz  delta slowing in right hemisphere. There was also predominantly 5-8Hz  theta-alpha activity in left hemisphere admixed with intermittent 2-3hz  delta slowing.  After around 2330 on 06/21/2021, as medications were adjusted, EEG was suggestive of burst attenuation pattern with bursts of low amplitude polymorphic asynchronous 2 to 3 Hz delta slowing with overriding 15 to 18 Hz beta activity lasting 2 to 3 seconds alternating with 5 to 8 seconds of generalized EEG attenuation. Patient event button was pressed on 06/22/2021 at 2001, 2016, 2216, 2038, 2253, 2309 and 2315 for left side neck jerking and possible left arm jerking ( covered under blanket).  Concomitant EEG initially showed sharply contoured 6 to 8 Hz theta-alpha activity in the right frontal temporal region which gradually evolved into 2 to 3 Hz delta slowing. Hyperventilation and photic stimulation were not performed.    ABNORMALITY - Focal seizure, right  frontotemporal region  - Continuous slow, generalized and lateralized right hemisphere - Burst attenuation, generalized  IMPRESSION: This study showed focal seizures arising from right frontotemporal region on 06/22/2021 at 2001, 2016, 2216, 2038, 2253, 2309 and 2315, lasting about 2 minute each.  During the seizure patient was noted to have left-sided neck jerking and possible left arm jerking ( covered under blanket). EEG was also suggestive of cortical dysfunction arising from right hemisphere, nonspecific etiology but likely secondary to underlying structural abnormality, post-ictal state.  Additionally EEG was initially suggestive of moderate diffuse encephalopathy.  After around 2330 on 06/21/2021, as medications were adjusted, EEG was suggestive of profound diffuse encephalopathy, nonspecific etiology but likely could be secondary to sedation.  Priyanka O Yadav   VAS 2316 LOWER EXTREMITY VENOUS (DVT)  Result Date: 06/22/2021  Lower Venous DVT Study Patient Name:  Julia [name] [facility]  Date of Exam:   06/22/2021 Medical Rec #: Morton Amy      Accession #:    [facility] Date of Birth: 27-Mar-1932      Patient Gender: F Patient Age:   14 years Exam Location:  [facility] Procedure:      VAS 11/07/1931 LOWER EXTREMITY VENOUS (DVT) Referring Phys: 97 Geanine Vandekamp --------------------------------------------------------------------------------  Indications: Stroke.  Comparison Study: no prior Performing Technologist: [place] Ponder RVS  Examination Guidelines: A complete evaluation includes B-mode imaging, spectral Doppler, color Doppler, and power Doppler as needed of all accessible portions of each vessel. Bilateral testing is considered an integral part of a complete examination. Limited examinations for reoccurring indications may be performed as noted. The reflux portion of the exam is performed with the patient in reverse Trendelenburg.  +---------+---------------+---------+-----------+----------+--------------+  RIGHT      Compressibility Phasicity Spontaneity Properties Thrombus Aging  +---------+---------------+---------+-----------+----------+--------------+  CFV       Full            Yes       Yes                                    +---------+---------------+---------+-----------+----------+--------------+  SFJ       Full                                                             +---------+---------------+---------+-----------+----------+--------------+  FV Prox   Full                                                             +---------+---------------+---------+-----------+----------+--------------+  FV Mid    Full            Yes       Yes                                    +---------+---------------+---------+-----------+----------+--------------+  FV Distal Full            Yes       Yes                                    +---------+---------------+---------+-----------+----------+--------------+  PFV       Full                                                             +---------+---------------+---------+-----------+----------+--------------+  POP       Full            Yes       Yes                                    +---------+---------------+---------+-----------+----------+--------------+  PTV       Full                                                             +---------+---------------+---------+-----------+----------+--------------+  PERO      Full                                                             +---------+---------------+---------+-----------+----------+--------------+   +---------+---------------+---------+-----------+----------+--------------+  LEFT      Compressibility Phasicity Spontaneity Properties Thrombus Aging  +---------+---------------+---------+-----------+----------+--------------+  CFV       Full            Yes       Yes                                    +---------+---------------+---------+-----------+----------+--------------+  SFJ       Full                                                              +---------+---------------+---------+-----------+----------+--------------+  FV Prox   Full                                                             +---------+---------------+---------+-----------+----------+--------------+  FV Mid    Full                                                             +---------+---------------+---------+-----------+----------+--------------+  FV Distal                 Yes       Yes                                    +---------+---------------+---------+-----------+----------+--------------+  PFV       Full                                                             +---------+---------------+---------+-----------+----------+--------------+  POP       Full            Yes       Yes                                    +---------+---------------+---------+-----------+----------+--------------+  PTV       Full                                                             +---------+---------------+---------+-----------+----------+--------------+  PERO      Full                                                             +---------+---------------+---------+-----------+----------+--------------+    Summary: BILATERAL: - No evidence of deep vein thrombosis seen in the lower extremities, bilaterally. -No evidence of popliteal cyst, bilaterally.   *See table(s) above for measurements and observations.    Preliminary    CT CHEST ABDOMEN PELVIS WO CONTRAST  Result Date: 29-Jun-2021 CLINICAL DATA:  Level 1 trauma. Fall. EXAM: CT CHEST, ABDOMEN AND PELVIS WITHOUT CONTRAST TECHNIQUE: Multidetector CT imaging of the chest, abdomen and pelvis was performed following the standard protocol without IV contrast. COMPARISON:  None similar FINDINGS: CT CHEST FINDINGS Cardiovascular: Normal heart size. Trace anterior pericardial effusion which appears low-density. Coronary atherosclerosis. Mediastinum/Nodes: Endotracheal and enteric tubes are in unremarkable position. No hematoma or  pneumomediastinum. 3.2 cm left thyroid nodule, unchanged from 2020. This was evaluated by ultrasound in 2019. No follow-up recommended unless clinically warranted (ref: J Am Coll Radiol. 2015 Feb;12(2): 143-50). Lungs/Pleura: Bands of scarring in the upper lungs with mild, cylindrical left upper lobe bronchiectasis. Prior left apical surgery with lung sutures. No hemothorax, pneumothorax, or lung contusion. Musculoskeletal: Scoliosis.  No acute fracture or subluxation. CT ABDOMEN PELVIS FINDINGS Hepatobiliary: No hepatic injury or perihepatic hematoma. Gallbladder is unremarkable Pancreas: Unremarkable Spleen: No splenic injury or perisplenic hematoma. Adrenals/Urinary Tract: No adrenal hemorrhage or renal injury identified. Bladder is unremarkable. Bilateral nephrolithiasis. Adjacent or lobulated right lower pole calculus in total measuring 9 mm. 3 mm interpolar left renal calculus. Stomach/Bowel: No evidence of injury. Rectal stool distention. The enteric tube tip is at the stomach. Vascular/Lymphatic: Stranding around the right femoral vessels related to phlebotomy per report. Atheromatous calcification. Reproductive: Hysterectomy Other: No ascites or pneumoperitoneum Musculoskeletal: Scoliosis and ordinary degeneration for age. No acute fracture or subluxation. IMPRESSION: No evidence of acute intrathoracic or intra-abdominal injury. Electronically Signed   By: Tiburcio Pea M.D.   On: 06/15/2021 11:05   CT Maxillofacial Wo Contrast  Result Date: 06/30/2021 CLINICAL DATA:  Level 1 trauma.  Found down. EXAM: CT HEAD WITHOUT CONTRAST CT MAXILLOFACIAL WITHOUT CONTRAST CT CERVICAL SPINE WITHOUT CONTRAST TECHNIQUE: Multidetector CT imaging of the head, cervical spine, and maxillofacial structures were performed using the standard protocol without intravenous contrast. Multiplanar CT image reconstructions of the cervical spine and maxillofacial structures were also generated. COMPARISON:  None. FINDINGS: CT  HEAD FINDINGS Brain: No evidence of acute infarction, hemorrhage, hydrocephalus, extra-axial collection or mass lesion/mass effect. 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 Vascular: No hyperdense vessel or unexpected calcification. Skull: No acute fracture CT MAXILLOFACIAL FINDINGS Osseous: No fracture or mandibular dislocation. No destructive process. Orbits: No evidence of injury Sinuses: Negative for hemosinus Soft tissues: No opaque foreign body or measurable hematoma. CT CERVICAL SPINE FINDINGS Alignment: No traumatic malalignment. Skull base and vertebrae: No acute fracture. No primary bone lesion or focal pathologic process. Soft tissues and spinal canal: No prevertebral fluid or swelling. No visible canal hematoma. Disc levels: Generalized disc collapse with endplate and facet spurring narrowing foramina diffusely, foraminal narrowing especially along the right-side of a scoliotic curvature. Upper chest: Reported separately IMPRESSION: No evidence of acute intracranial or cervical spine injury. Negative for facial fracture. Electronically Signed   By:  Tiburcio Pea M.D.   On: 2021/07/16 11:07     PHYSICAL EXAM  Temp:  [96.6 F (35.9 C)-99.5 F (37.5 C)] 99.5 F (37.5 C) (12/15 1544) Pulse Rate:  [66-173] 79 (12/15 1501) Resp:  [15-23] 16 (12/15 1500) BP: (95-156)/(42-119) 120/49 (12/15 1500) SpO2:  [79 %-100 %] 100 % (12/15 1500) FiO2 (%):  [40 %] 40 % (12/15 1501) Weight:  [53.4 kg] 53.4 kg (12/15 0441)  General - Well nourished, well developed, intubated on sedation.  Ophthalmologic - fundi not visualized due to noncooperation.  Cardiovascular - Regular rate and rhythm.  Neuro - intubated on sedation, eyes closed, not following commands. With forced eye opening, eyes in mid position, not blinking to visual threat, doll's eyes absent, not tracking, right pinpoint pupil, left pupil pinpoint but irregular shape with evidence of prior ophthalmic surgery. Corneal  reflex absent on the left, weakly present on the right, gag and cough present. Breathing over the vent.  Facial symmetry not able to test due to ET tube.  Tongue protrusion not cooperative. On pain stimulation, no significant movement of all extremities. No babinski. Sensation, coordination and gait not tested.   ASSESSMENT/PLAN Ms. RUCHAMA KUBICEK is a 87 y.o. female with history of cerebral palsy, hypertension, diabetes, sarcoidosis admitted for #home with facial trauma, altered mental status. No tPA given due to unknown last known well.    Stroke:  right MCA and ACA infarcts embolic pattern secondary to unclear source, concerning for occult A. fib CT no acute abnormality MRI right MCA, MCA/PCA and MCA/ACA scattered infarcts CT head and neck unremarkable 2D Echo pending LE venous Doppler no DVT Will consider loop recorder versus surgery CardioNet monitoring for cardioembolic work-up if aggressive care LDL 25 HgbA1c 8.0 Heparin subcu for VTE prophylaxis aspirin 81 mg daily prior to admission, now on aspirin 325 mg daily given potential procedures (trach or PEG) Ongoing aggressive stroke risk factor management Therapy recommendations: Pending Disposition: Pending  Seizure Reported right facial twitching and right arm jerking movement Received Versed and propofol bolus EEG 12/13 cortical dysfunction arising from right hemisphere, nonspecific etiology  LTM EEG showed focal seizures arising from right frontotemporal region lasting about 2 minute each.  During the seizure patient was noted to have left-sided neck jerking and possible left arm jerking  Keppra load followed by 500 bid->1500 bid Depakote load followed by 500 Q8 Depakote level daily  Fever Leukocytosis  Spiking fever 12/13, T-max 100.9->afebrile Today afebrile WBC 17.8-19.0-14.2 UA WBC 20-51 CXR unremarkable Blood culture pending On cefepime -> zosyn  Diabetes, uncontrolled HgbA1c 8.0 goal <  7.0 Uncontrolled Currently on Levemir 5->10 U twice daily CBG monitoring SSI DM education and close PCP follow up  Respiratory failure Intubated on sedation CCM on board Not candidate for extubation today  Hypertension Stable Long term BP goal normotensive  Other Stroke Risk Factors Advanced age  Other Active Problems Cerebral palsy Sarcoidosis  Hospital day # 2  This patient is critically ill due to right MCA and ACA infarcts, seizure, fever, leukocytosis and at significant risk of neurological worsening, death form recurrent stroke, hemorrhagic conversion, sepsis, status epilepticus. This patient's care requires constant monitoring of vital signs, hemodynamics, respiratory and cardiac monitoring, review of multiple databases, neurological assessment, discussion with family, other specialists and medical decision making of high complexity. I spent 35 minutes of neurocritical care time in the care of this patient. I also discussed with CCM Dr. Craige Cotta.   Marvel Plan, MD PhD Stroke Neurology 06/22/2021 3:50 PM  To contact Stroke Continuity provider, please refer to [email or web address] After hours, contact General Neurology

## 2021-06-22 NOTE — Procedures (Addendum)
Patient Name: Julia [name]  MRN: [id number]  Epilepsy Attending: Charlsie Quest  Referring Physician/Provider: Dr Marvel Plan Duration: 06/21/2021 1445 to 06/22/2021 1445   Patient history: 87yo F with ams. EEG to evaluate for seizure   Level of alertness: comatose   AEDs during EEG study: Propofol, LEV,VPA   Technical aspects: This EEG study was done with scalp electrodes positioned according to the 10-20 International system of electrode placement. Electrical activity was acquired at a sampling rate of 500Hz  and reviewed with a high frequency filter of 70Hz  and a low frequency filter of 1Hz . EEG data were recorded continuously and digitally stored.    Description: EEG initially showed continuous low amplitude 2-3hz  delta slowing in right hemisphere. There was also predominantly 5-8Hz  theta-alpha activity in left hemisphere admixed with intermittent 2-3hz  delta slowing.  After around 2330 on 06/21/2021, as medications were adjusted, EEG was suggestive of burst attenuation pattern with bursts of low amplitude polymorphic asynchronous 2 to 3 Hz delta slowing with overriding 15 to 18 Hz beta activity lasting 2 to 3 seconds alternating with 5 to 8 seconds of generalized EEG attenuation.  Patient event button was pressed on 06/22/2021 at 2001, 2016, 2216, 2038, 2253, 2309 and 2315 for left side neck jerking and possible left arm jerking ( covered under blanket).  Concomitant EEG initially showed sharply contoured 6 to 8 Hz theta-alpha activity in the right frontal temporal region which gradually evolved into 2 to 3 Hz delta slowing.  Hyperventilation and photic stimulation were not performed.      ABNORMALITY - Focal seizure, right frontotemporal region   - Continuous slow, generalized and lateralized right hemisphere - Burst attenuation, generalized   IMPRESSION: This study showed focal seizures arising from right frontotemporal region on 06/22/2021 at 2001, 2016, 2216, 2038, 2253, 2309 and  2315, lasting about 2 minute each.  During the seizure patient was noted to have left-sided neck jerking and possible left arm jerking ( covered under blanket).   EEG was also suggestive of cortical dysfunction arising from right hemisphere, nonspecific etiology but likely secondary to underlying structural abnormality, post-ictal state.   Additionally EEG was initially suggestive of moderate diffuse encephalopathy.  After around 2330 on 06/21/2021, as medications were adjusted, EEG was suggestive of profound diffuse encephalopathy, nonspecific etiology but likely could be secondary to sedation.    Julia [name] 2316

## 2021-06-22 NOTE — Progress Notes (Signed)
LTM maint complete - no skin breakdown under: Fp2 F4 F8 [facility] monitored, Event button test confirmed by [facility].  

## 2021-06-22 NOTE — Progress Notes (Signed)
Notified Dr Craige Cotta of patients decreased UOP of 25cc for 6 hours. New orders received; Will continue to monitor closely.     Dawson Bills

## 2021-06-22 NOTE — Progress Notes (Addendum)
NAME:  Julia [name], MRN:  [id number], DOB:  [birth year], LOS: 2 ADMISSION DATE:  06/25/2021, CONSULTATION DATE:  06/30/2021 REFERRING MD:  Dr. Jeraldine Loots, ER, CHIEF COMPLAINT:  AMS   History of Present Illness:  87 yo female found unresponsive at home.  Required intubation for airway protection.  Found to have leukocytosis, lactic acidosis, hyperglycemia, hypernatremia, HTN (BP 229/119).  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  Pertinent  Medical History  GERD, Esophageal stricture, UTI, Rhabdo, CP, Lt eye aphakia, Cataracts, CVA, HTN, DM type 2  Significant Hospital Events: Including procedures, antibiotic start and stop dates in addition to other pertinent events   12/13 Admit, neurology and trauma consulted 12/14 new onset seizure, start LTM  Studies:  CT head 12/13 >> no acute infarction/hemorrhage/hydrocephalus; remote Lt parieto-occipital infarct CT chest 12/13 >> 3.2 cm Lt thyroid nodule (stable since 2020), scarring in upper lungs with cylindrical BTX LUL, prior Lt apical lung surgery, scoliosis EEG 12/13 >> generalized slowing and Rt hemisphere lateralization MRI brain 12/13 >> Restricted diffusion in Rt frontal parietal cortex primarily in Rt MCA territory but also Rt ACA/PCA and Lt ACA/MCA territories; concerning for embolic CVA CT angio head/neck 12/14 >> no large vessel occlusions EEG 12/15 >> focal seizure, Rt frontotemporal region; generalized burst suppression; generalized slowing  Interim History / Subjective:  Recurrent seizure overnight.  Objective   Blood pressure (!) 108/48, pulse 75, temperature 97.9 F (36.6 C), resp. rate 17, height 5\' 2"  (1.575 m), weight 53.4 kg, SpO2 100 %.    Vent Mode: PRVC FiO2 (%):  [40 %] 40 % Set Rate:  [16 bmp] 16 bmp Vt Set:  [400 mL] 400 mL PEEP:  [5 cmH20] 5 cmH20 Plateau Pressure:  [13 cmH20-17 cmH20] 17 cmH20   Intake/Output Summary (Last 24 hours) at 06/22/2021 1010 Last data filed at 06/22/2021 0800 Gross per 24 hour  Intake 2299.83  ml  Output 400 ml  Net 1899.83 ml   Filed Weights   06/27/2021 1031 06/21/21 0500 06/22/21 0441  Weight: 60 kg 48.2 kg 53.4 kg    Examination:  General - sedated Eyes - pupils pinpoint ENT - ETT in place Cardiac - regular rate/rhythm, no murmur Chest - equal breath sounds b/l, no wheezing or rales Abdomen - soft, non tender, + bowel sounds Extremities - lower extremity lymphedema Skin - no rashes Neuro - RASS -2  Resolved Hospital Problem list   Hyperkalemia, Lactic acidosis, Hypokalemia  Assessment & Plan:   Acute metabolic encephalopathy with acute embolic CVA and prior hx of CVA. New onset seizures. - AEDs per neurology - continue ASA - f/u doppler legs, Echo  Compromised airway with acute hypoxic respiratory failure. - pressure support wean as able - mental status barrier to extubation trial at this time - f/u CXR intermittently - goal SpO2 > 92%  HTN emergency. - goal SBP < 180  DM type 2 poorly controlled with hyperglycemia. - SSI with levemir  Recurrent UTI. Aspiration pneumonitis, POA. - day 3/5 of ABx - f/u blood cultures from 12/13  Anemia of critical illness. - f/u CBC - transfuse for Hb < 7  Best Practice (right click and "Reselect all SmartList Selections" daily)   Diet/type: tubefeeds DVT prophylaxis: prophylactic heparin  GI prophylaxis: PPI Lines: Central line Foley:  N/A Code Status:  full code Last date of multidisciplinary goals of care discussion [x]   Labs    CMP Latest Ref Rng & Units 06/22/2021 06/21/2021 06/15/2021  Glucose 70 - 99 mg/dL 06/23/2021) 06/22/2021)  166(H)  BUN 8 - 23 mg/dL 16 18 18   Creatinine 0.44 - 1.00 mg/dL 0.81 0.94 0.90  Sodium 135 - 145 mmol/L 146(H) 147(H) 146(H)  Potassium 3.5 - 5.1 mmol/L 3.7 4.3 5.0  Chloride 98 - 111 mmol/L 114(H) 117(H) 118(H)  CO2 22 - 32 mmol/L 22 23 22   Calcium 8.9 - 10.3 mg/dL 8.2(L) 9.3 9.3  Total Protein 6.5 - 8.1 g/dL - - -  Total Bilirubin 0.3 - 1.2 mg/dL - - -  Alkaline Phos 38 -  126 U/L - - -  AST 15 - 41 U/L - - -  ALT 0 - 44 U/L - - -    CBC Latest Ref Rng & Units 06/22/2021 06/21/2021 07/03/2021  WBC 4.0 - 10.5 K/uL 14.2(H) 19.0(H) -  Hemoglobin 12.0 - 15.0 g/dL 9.8(L) 12.3 15.0  Hematocrit 36.0 - 46.0 % 31.8(L) 39.9 44.0  Platelets 150 - 400 K/uL 176 249 -    ABG    Component Value Date/Time   PHART 7.451 (H) 06/15/2021 1204   PCO2ART 31.8 (L) 06/19/2021 1204   PO2ART 539 (H) 06/08/2021 1204   HCO3 22.1 06/19/2021 1204   TCO2 23 06/22/2021 1204   ACIDBASEDEF 1.0 07/03/2021 1204   O2SAT 100.0 06/23/2021 1204    CBG (last 3)  Recent Labs    06/21/21 2327 06/22/21 0336 06/22/21 0741  GLUCAP 196* 137* 177*    Critical care time: 36 minutes  Chesley Mires, MD [place] Pager - [phone] 06/22/2021, 10:10 AM

## 2021-06-22 NOTE — Progress Notes (Signed)
Lower extremity venous has been completed.   Preliminary results in CV Proc.   Julia [name] 06/22/2021 11:41 AM

## 2021-06-22 NOTE — Progress Notes (Signed)
Inpatient Diabetes Program Recommendations  AACE/ADA: New Consensus Statement on Inpatient Glycemic Control (2015)  Target Ranges:  Prepandial:   less than 140 mg/dL      Peak postprandial:   less than 180 mg/dL (1-2 hours)      Critically ill patients:  140 - 180 mg/dL   Lab Results  Component Value Date   GLUCAP 177 (H) 06/22/2021   HGBA1C 8.0 (H) 06-27-2021    Review of Glycemic Control  Latest Reference Range & Units 06/22/21 03:36 06/22/21 07:41  Glucose-Capillary 70 - 99 mg/dL 643 (H) 329 (H)  (H): Data is abnormally high  Diabetes history: DM2 Outpatient Diabetes medications: Glipizide 10 mg BID Current orders for Inpatient glycemic control: Levemir 5 units BID, Novolog 0-15 units Q4H  Consult for DM management.  Will follow trends.   Will continue to follow while inpatient.  Thank you, Dulce Sellar, RN, BSN Diabetes Coordinator Inpatient Diabetes Program [phone] (team pager from 8a-5p)

## 2021-06-23 ENCOUNTER — Inpatient Hospital Stay (HOSPITAL_COMMUNITY): Payer: Medicare Other

## 2021-06-23 DIAGNOSIS — I6389 Other cerebral infarction: Secondary | ICD-10-CM

## 2021-06-23 DIAGNOSIS — R4189 Other symptoms and signs involving cognitive functions and awareness: Secondary | ICD-10-CM

## 2021-06-23 LAB — BASIC METABOLIC PANEL
Anion gap: 6 (ref 5–15)
BUN: 22 mg/dL (ref 8–23)
CO2: 22 mmol/L (ref 22–32)
Calcium: 7.3 mg/dL — ABNORMAL LOW (ref 8.9–10.3)
Chloride: 115 mmol/L — ABNORMAL HIGH (ref 98–111)
Creatinine, Ser: 0.9 mg/dL (ref 0.44–1.00)
GFR, Estimated: >60 mL/min (ref >60)
Glucose, Bld: 305 mg/dL — ABNORMAL HIGH (ref 70–99)
Potassium: 3.3 mmol/L — ABNORMAL LOW (ref 3.5–5.1)
Sodium: 143 mmol/L (ref 135–145)

## 2021-06-23 LAB — GLUCOSE, CAPILLARY
Glucose-Capillary: 244 mg/dL — ABNORMAL HIGH (ref 70–99)
Glucose-Capillary: 248 mg/dL — ABNORMAL HIGH (ref 70–99)
Glucose-Capillary: 214 mg/dL — ABNORMAL HIGH (ref 70–99)
Glucose-Capillary: 216 mg/dL — ABNORMAL HIGH (ref 70–99)
Glucose-Capillary: 203 mg/dL — ABNORMAL HIGH (ref 70–99)
Glucose-Capillary: 196 mg/dL — ABNORMAL HIGH (ref 70–99)

## 2021-06-23 LAB — CBC
HCT: 29.7 % — ABNORMAL LOW (ref 36.0–46.0)
Hemoglobin: 9.1 g/dL — ABNORMAL LOW (ref 12.0–15.0)
MCH: 22.9 pg — ABNORMAL LOW (ref 26.0–34.0)
MCHC: 30.6 g/dL (ref 30.0–36.0)
MCV: 74.6 fL — ABNORMAL LOW (ref 80.0–100.0)
Platelets: 170 10*3/uL (ref 150–400)
RBC: 3.98 MIL/uL (ref 3.87–5.11)
RDW: 16.5 % — ABNORMAL HIGH (ref 11.5–15.5)
WBC: 16.7 10*3/uL — ABNORMAL HIGH (ref 4.0–10.5)
nRBC: 0 % (ref 0.0–0.2)

## 2021-06-23 LAB — VALPROIC ACID LEVEL: Valproic Acid Lvl: 33 ug/mL — ABNORMAL LOW (ref 50.0–100.0)

## 2021-06-23 LAB — ECHOCARDIOGRAM COMPLETE
Area-P 1/2: 3.65 cm2
Height: 62 in
S' Lateral: 2 cm
Weight: 1883.61 oz

## 2021-06-23 MED ORDER — VALPROATE SODIUM 100 MG/ML IV SOLN
1000.0000 mg | Freq: Once | INTRAVENOUS | Status: AC
  Administered 2021-06-23: 1000 mg via INTRAVENOUS
  Filled 2021-06-23: qty 10

## 2021-06-23 MED ORDER — POTASSIUM CHLORIDE 20 MEQ PO PACK
20.0000 meq | PACK | ORAL | Status: AC
  Administered 2021-06-23 (×2): 20 meq
  Filled 2021-06-23 (×2): qty 1

## 2021-06-23 MED ORDER — POTASSIUM CHLORIDE 10 MEQ/50ML IV SOLN
10.0000 meq | INTRAVENOUS | Status: AC
  Administered 2021-06-23 (×4): 10 meq via INTRAVENOUS
  Filled 2021-06-23 (×4): qty 50

## 2021-06-23 MED ORDER — INSULIN DETEMIR 100 UNIT/ML ~~LOC~~ SOLN
15.0000 [IU] | Freq: Two times a day (BID) | SUBCUTANEOUS | Status: DC
  Administered 2021-06-23 – 2021-06-29 (×13): 15 [IU] via SUBCUTANEOUS
  Filled 2021-06-23 (×14): qty 0.15

## 2021-06-23 NOTE — Progress Notes (Deleted)
Inpatient Diabetes Program Recommendations  AACE/ADA: New Consensus Statement on Inpatient Glycemic Control (2015)  Target Ranges:  Prepandial:   less than 140 mg/dL      Peak postprandial:   less than 180 mg/dL (1-2 hours)      Critically ill patients:  140 - 180 mg/dL   Lab Results  Component Value Date   GLUCAP 248 (H) 06/23/2021   HGBA1C 8.0 (H) 06/22/2021    Review of Glycemic Control  Latest Reference Range & Units 06/22/21 15:43 06/22/21 19:25 06/22/21 23:34 06/23/21 03:25 06/23/21 07:42  Glucose-Capillary 70 - 99 mg/dL 720 (H) 947 (H) 096 (H) 244 (H) 248 (H)  (H): Data is abnormally high   Current orders for Inpatient glycemic control: Levemir 15 Q12H, Novolog 0-15 units Q4H, tube feeds at 61ml/hr  Inpatient Diabetes Program Recommendations:    Novolog 3 units Q4H tube feed coverage.  Hold if feeds are held or discontinued.   Will continue to follow while inpatient.  Thank you, Dulce Sellar, RN, BSN Diabetes Coordinator Inpatient Diabetes Program [phone] (team pager from 8a-5p)

## 2021-06-23 NOTE — Progress Notes (Signed)
vLTM maintenance  All impedances below 10kohms.  All ski clear of breakdown  pt event button tested with [facility]

## 2021-06-23 NOTE — Progress Notes (Signed)
STROKE TEAM PROGRESS NOTE   SUBJECTIVE (INTERVAL HISTORY) RN is at the bedside. Pt still intubated, just tapered off sedation, still not responsive, poor brainstem reflexes. LTM EEG no seizure. Depakote level 33, will give another loading dose. Agree with CCM, if pt not improving over the weekend, palliative care involvement is reasonable.   OBJECTIVE Temp:  [98.4 F (36.9 C)-99.9 F (37.7 C)] 99 F (37.2 C) (12/16 1200) Pulse Rate:  [72-84] 77 (12/16 1200) Cardiac Rhythm: Normal sinus rhythm (12/16 0800) Resp:  [16-20] 18 (12/16 1200) BP: (105-144)/(46-60) 132/56 (12/16 1200) SpO2:  [95 %-100 %] 100 % (12/16 1200) FiO2 (%):  [30 %-40 %] 30 % (12/16 1105)  Recent Labs  Lab 06/22/21 1925 06/22/21 2334 06/23/21 0325 06/23/21 0742 06/23/21 1138  GLUCAP 205* 215* 244* 248* 214*   Recent Labs  Lab 2021/07/17 1802 Jul 17, 2021 2126 06/21/21 0347 06/22/21 0437 06/23/21 0412  NA 144 146* 147* 146* 143  K 6.9* 5.0 4.3 3.7 3.3*  CL 115* 118* 117* 114* 115*  CO2 20* 22 23 22 22   GLUCOSE 322* 166* 153* 285* 305*  BUN 16 18 18 16 22   CREATININE 0.74 0.90 0.94 0.81 0.90  CALCIUM 9.4 9.3 9.3 8.2* 7.3*  MG  --   --  1.9 1.5*  --   PHOS  --   --  1.7* 3.9  --    Recent Labs  Lab 07/17/21 1126  AST 25  ALT 13  ALKPHOS 95  BILITOT 1.1  PROT 6.6  ALBUMIN 3.1*   Recent Labs  Lab 07/17/21 1017 2021-07-17 1034 07/17/21 1204 06/21/21 0347 06/22/21 0437 06/23/21 0412  WBC 17.8*  --   --  19.0* 14.2* 16.7*  HGB 14.4 16.7* 15.0 12.3 9.8* 9.1*  HCT 47.2* 49.0* 44.0 39.9 31.8* 29.7*  MCV 75.0*  --   --  75.1* 75.0* 74.6*  PLT 290  --   --  249 176 170   No results for input(s): CKTOTAL, CKMB, CKMBINDEX, TROPONINI in the last 168 hours. No results for input(s): LABPROT, INR in the last 72 hours.  No results for input(s): COLORURINE, LABSPEC, PHURINE, GLUCOSEU, HGBUR, BILIRUBINUR, KETONESUR, PROTEINUR, UROBILINOGEN, NITRITE, LEUKOCYTESUR in the last 72 hours.  Invalid input(s):  APPERANCEUR      Component Value Date/Time   CHOL 94 06/21/2021 1114   TRIG 41 06/21/2021 1114   HDL 61 06/21/2021 1114   CHOLHDL 1.5 06/21/2021 1114   VLDL 8 06/21/2021 1114   LDLCALC 25 06/21/2021 1114   Lab Results  Component Value Date   HGBA1C 8.0 (H) 17-Jul-2021   No results found for: LABOPIA, COCAINSCRNUR, LABBENZ, AMPHETMU, THCU, LABBARB  Recent Labs  Lab 07-17-21 1126  ETH <10    I have personally reviewed the radiological images below and agree with the radiology interpretations.  CT ANGIO HEAD NECK W WO CM  Result Date: 06/21/2021 CLINICAL DATA:  Stroke/TIA, determine embolic source EXAM: CT ANGIOGRAPHY HEAD AND NECK TECHNIQUE: Multidetector CT imaging of the head and neck was performed using the standard protocol during bolus administration of intravenous contrast. Multiplanar CT image reconstructions and MIPs were obtained to evaluate the vascular anatomy. Carotid stenosis measurements (when applicable) are obtained utilizing NASCET criteria, using the distal internal carotid diameter as the denominator. CONTRAST:  68mL OMNIPAQUE IOHEXOL 350 MG/ML SOLN COMPARISON:  CT and MRI brain 07-17-21; MRA head 2015 FINDINGS: CT HEAD Brain: There is no acute intracranial hemorrhage. There is new loss of gray-white differentiation corresponding to some of the areas  of infarction seen on the MRI, particularly in the right hemisphere. There is no mass effect. No hydrocephalus. Stable chronic infarcts and chronic microvascular ischemic changes. Vascular: No new finding. Skull: Calvarium is unremarkable. Sinuses/Orbits: No acute finding. Other: None. Review of the MIP images confirms the above findings CTA NECK Aortic arch: Mild calcified plaque along the arch. Great vessel origins appear. Right carotid system: Patent.  No stenosis. Left carotid system: Patent. Plaque at the ICA origin causes less than 50% stenosis. Vertebral arteries: Patent.  Codominant.  No stenosis. Skeleton:  Degenerative changes of the included spine. Other neck: Enlarged, heterogeneous thyroid previously evaluated by ultrasound. Endotracheal and enteric tubes are present Upper chest: Scarring with traction bronchiectasis. Review of the MIP images confirms the above findings CTA HEAD Anterior circulation: Intracranial internal carotid arteries are patent with calcified plaque causing mild stenosis. Anterior and middle cerebral arteries are patent. Atherosclerotic irregularity of distal branches. Posterior circulation: Intracranial vertebral arteries are patent. Basilar artery is patent. Major cerebellar artery origins are patent. Right posterior communicating artery is present. Posterior cerebral arteries are patent. Venous sinuses: Patent as allowed by contrast bolus timing. Review of the MIP images confirms the above findings IMPRESSION: No large vessel occlusion or hemodynamically significant stenosis in the neck. No proximal intracranial vessel occlusion. Atherosclerotic irregularity of medium to small vessel branches in the anterior circulation. Electronically Signed   By: Guadlupe Spanish M.D.   On: 06/21/2021 14:48   DG Abd 1 View  Result Date: 06/21/2021 CLINICAL DATA:  NG tube placement. EXAM: ABDOMEN - 1 VIEW COMPARISON:  CT scan 07/05/2021 FINDINGS: The bowel gas pattern is unremarkable. The NG tube tip is in the antropyloric region of the stomach. IMPRESSION: NG tube tip is in the antropyloric region of the stomach. Electronically Signed   By: Rudie Meyer M.D.   On: 06/21/2021 13:37   CT Head Wo Contrast  Result Date: 06/10/2021 CLINICAL DATA:  Level 1 trauma.  Found down. EXAM: CT HEAD WITHOUT CONTRAST CT MAXILLOFACIAL WITHOUT CONTRAST CT CERVICAL SPINE WITHOUT CONTRAST TECHNIQUE: Multidetector CT imaging of the head, cervical spine, and maxillofacial structures were performed using the standard protocol without intravenous contrast. Multiplanar CT image reconstructions of the cervical spine and  maxillofacial structures were also generated. COMPARISON:  None. FINDINGS: CT HEAD FINDINGS Brain: No evidence of acute infarction, hemorrhage, hydrocephalus, extra-axial collection or mass lesion/mass effect. 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 Vascular: No hyperdense vessel or unexpected calcification. Skull: No acute fracture CT MAXILLOFACIAL FINDINGS Osseous: No fracture or mandibular dislocation. No destructive process. Orbits: No evidence of injury Sinuses: Negative for hemosinus Soft tissues: No opaque foreign body or measurable hematoma. CT CERVICAL SPINE FINDINGS Alignment: No traumatic malalignment. Skull base and vertebrae: No acute fracture. No primary bone lesion or focal pathologic process. Soft tissues and spinal canal: No prevertebral fluid or swelling. No visible canal hematoma. Disc levels: Generalized disc collapse with endplate and facet spurring narrowing foramina diffusely, foraminal narrowing especially along the right-side of a scoliotic curvature. Upper chest: Reported separately IMPRESSION: No evidence of acute intracranial or cervical spine injury. Negative for facial fracture. Electronically Signed   By: Tiburcio Pea M.D.   On: 06/22/2021 11:07   CT Cervical Spine Wo Contrast  Result Date: 06/23/2021 CLINICAL DATA:  Level 1 trauma.  Found down. EXAM: CT HEAD WITHOUT CONTRAST CT MAXILLOFACIAL WITHOUT CONTRAST CT CERVICAL SPINE WITHOUT CONTRAST TECHNIQUE: Multidetector CT imaging of the head, cervical spine, and maxillofacial structures were performed using the standard protocol  without intravenous contrast. Multiplanar CT image reconstructions of the cervical spine and maxillofacial structures were also generated. COMPARISON:  None. FINDINGS: CT HEAD FINDINGS Brain: No evidence of acute infarction, hemorrhage, hydrocephalus, extra-axial collection or mass lesion/mass effect. 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 Vascular: No  hyperdense vessel or unexpected calcification. Skull: No acute fracture CT MAXILLOFACIAL FINDINGS Osseous: No fracture or mandibular dislocation. No destructive process. Orbits: No evidence of injury Sinuses: Negative for hemosinus Soft tissues: No opaque foreign body or measurable hematoma. CT CERVICAL SPINE FINDINGS Alignment: No traumatic malalignment. Skull base and vertebrae: No acute fracture. No primary bone lesion or focal pathologic process. Soft tissues and spinal canal: No prevertebral fluid or swelling. No visible canal hematoma. Disc levels: Generalized disc collapse with endplate and facet spurring narrowing foramina diffusely, foraminal narrowing especially along the right-side of a scoliotic curvature. Upper chest: Reported separately IMPRESSION: No evidence of acute intracranial or cervical spine injury. Negative for facial fracture. Electronically Signed   By: Tiburcio Pea M.D.   On: Jul 01, 2021 11:07   MR BRAIN WO CONTRAST  Result Date: 07/01/2021 CLINICAL DATA:  Neuro deficit, stroke suspected EXAM: MRI HEAD WITHOUT CONTRAST TECHNIQUE: Multiplanar, multiecho pulse sequences of the brain and surrounding structures were obtained without intravenous contrast. COMPARISON:  05/17/2019. FINDINGS: Brain: Areas of cortical restricted diffusion with ADC correlate in the right MCA territory (series 5, image 95, for example), with additional smaller areas of cortical restricted diffusion in the right PCA territory (series 5, image 76-7) and right ACA territory (series 5, image 97-99), with additional more punctate right MCA white matter infarcts (series 5, image 86 for example), and with additional scattered left frontal infarcts (series 5, images 88 and 92). No significant gyral swelling. No acute hemorrhage, mass, mass effect, or midline shift. Confluent T2 hyperintense signal in the periventricular white matter and pons, likely the sequela of severe chronic small vessel ischemic disease. Remote  left parietal infarct. Vascular: Normal flow voids. Skull and upper cervical spine: Choose 1 Sinuses/Orbits: Mucosal thickening in the right frontal sinus, right ethmoid air cells, right sphenoid sinus, and right maxillary sinus. Status post bilateral lens replacements. Other: None. IMPRESSION: Restricted diffusion in the right frontal parietal cortex, primarily in the right MCA territory, but also to a lesser extent in the right ACA, right PCA, and left ACA and MCA territory. This is overall concerning for an embolic etiology. No evidence of hemorrhagic conversion, mass effect, or midline shift. These results will be called to the ordering clinician or representative by the Radiologist Assistant, and communication documented in the PACS or [employer]. Electronically Signed   By: Wiliam Ke M.D.   On: 07/01/2021 23:39   DG Pelvis Portable  Result Date: 2021/07/01 CLINICAL DATA:  Trauma EXAM: PORTABLE PELVIS 1-2 VIEWS COMPARISON:  None. FINDINGS: Patient is rotated to the left which somewhat evaluation. There is no evidence of pelvic fracture or diastasis. No pelvic bone lesions are seen. Limited evaluation of the sacrum due to overlying bowel gas. IMPRESSION: No acute osseous abnormality. Electronically Signed   By: Allegra Lai M.D.   On: 01-Jul-2021 10:46   DG Chest Port 1 View  Result Date: 06/23/2021 CLINICAL DATA:  Respiratory failure EXAM: PORTABLE CHEST 1 VIEW COMPARISON:  Chest radiograph 06/21/2021 FINDINGS: The endotracheal tube tip is approximately 3.4 cm from the carina. The left sided vascular catheter terminates at the cavoatrial junction. The enteric catheter tip projects over the region of the pylorus. The cardiomediastinal silhouette is stable. There are  likely small bilateral pleural effusions with adjacent subsegmental atelectasis. Otherwise, there is no new focal consolidation. There is no pulmonary edema. There is no appreciable pneumothorax. Chain sutures in the left upper  lobe are unchanged. IMPRESSION: Small bilateral pleural effusions with adjacent atelectasis, increased in conspicuity since 06/21/2021. Electronically Signed   By: Lesia Hausen M.D.   On: 06/23/2021 09:22   DG CHEST PORT 1 VIEW  Result Date: 06/21/2021 CLINICAL DATA:  Shortness of breath EXAM: PORTABLE CHEST 1 VIEW COMPARISON:  Previous studies including the examination of Jun 23, 2021 FINDINGS: Apparent shift of mediastinum to the left may be due to rotation. Transverse diameter of heart is within normal limits. Thoracic aorta is tortuous and ectatic. Tip of endotracheal tube is 4.1 cm above the carina. Tip of left jugular central venous catheter is seen at the junction of superior vena cava and right atrium. Tip of enteric tube is seen in the stomach. Side-port in the enteric tube is in the lower thoracic esophagus close to gastroesophageal junction. Linear densities in the right upper lung fields may suggest scarring or subsegmental atelectasis. There is no new focal pulmonary consolidation. There is no pleural effusion or pneumothorax. IMPRESSION: There are no new infiltrates or signs of pulmonary edema. Side-port in enteric tube is seen in the lower thoracic esophagus close to gastroesophageal junction. Enteric tube could be advanced 5-10 cm to place the side port within the stomach. Electronically Signed   By: Ernie Avena M.D.   On: 06/21/2021 08:08   DG Chest Portable 1 View  Result Date: 2021/06/23 CLINICAL DATA:  Central line placement EXAM: PORTABLE CHEST 1 VIEW COMPARISON:  Previous studies including examination done earlier today. FINDINGS: Cardiac size is within normal limits. Thoracic aorta is tortuous and ectatic. Tip of endotracheal tube is approximately 3.6 cm above the carina. Enteric tube is noted traversing the esophagus with its side port at the gastroesophageal junction. There is interval placement of left internal jugular central venous catheter with its tip in the region of  junction of superior vena cava and right atrium. Linear density is seen in the right upper lung fields and left mid lung fields suggesting with no significant interval change suggesting scarring or subsegmental atelectasis. There is no new focal consolidation. There are no signs of alveolar pulmonary edema. IMPRESSION: There are no new infiltrates or signs of pulmonary edema. Linear densities in the right upper lobe and left mid lung fields may suggest scarring or subsegmental atelectasis. Other findings as described in the body of the report. Electronically Signed   By: Ernie Avena M.D.   On: Jun 23, 2021 12:59   DG Chest Port 1 View  Result Date: 06-23-2021 CLINICAL DATA:  Trauma EXAM: PORTABLE CHEST 1 VIEW COMPARISON:  Chest x-ray dated November 02, 2020 FINDINGS: ET tube tip projects approximately 1.0 cm from the carina. Enteric tube partially seen coursing below diaphragm. Evaluation is limited due to left port patient rotation. Bilateral upper lung linear opacities are unchanged compared to prior exam likely due to scarring or atelectasis. No focal consolidation. No large pleural effusion or evidence of pneumothorax. Limited evaluation of cardiac and mediastinal contours due to patient rotation. IMPRESSION: No acute cardiopulmonary no acute cardiopulmonary abnormality, evaluation somewhat limited due to patient rotation. Electronically Signed   By: Allegra Lai M.D.   On: 2021/06/23 10:44   EEG adult  Result Date: 2021/06/23 Charlsie Quest, MD     23-Jun-2021  2:29 PM Patient Name: MARGEART ALLENDER MRN: [id number] Epilepsy Attending: Lorrin Jackson  Annabelle Harman Referring Physician/Provider: Dr Levon Hedger Date: June 21, 2021 Duration: 24.16 mins Patient history: 87yo F with ams. EEG to evaluate for seizure Level of alertness: comatose AEDs during EEG study: Propofol Technical aspects: This EEG study was done with scalp electrodes positioned according to the 10-20 International system of electrode placement.  Electrical activity was acquired at a sampling rate of  and reviewed with a high frequency filter of  and a low frequency filter of . EEG data were recorded continuously and digitally stored. Description: EEG showed continuous low amplitude 2-3hz  delta slowing in right hemisphere. There was also predominantly 5-8Hz  theta-alpha activity in left hemisphere admixed with intermittent 2-3hz  delta slowing. Hyperventilation and photic stimulation were not performed.   ABNORMALITY - Continuous slow, generalized and lateralized right hemisphere IMPRESSION: This study is suggestive of cortical dysfunction arising from right hemisphere, nonspecific etiology but likely secondary to underlying structural abnormality, post-ictal state. Additionally there is moderate diffuse encephalopathy, nonspecific etiology but likely could be secondary to sedation. No seizures or epileptiform discharges were seen throughout the recording. Dr Wilford Corner was notified. Priyanka Annabelle Harman   Overnight EEG with video  Result Date: 06/22/2021 Charlsie Quest, MD     06/23/2021  9:24 AM Patient Name: JEN BENEDICT MRN: [id number] Epilepsy Attending: Charlsie Quest Referring Physician/Provider: Dr Marvel Plan Duration: 06/21/2021 1445 to 06/22/2021 1445  Patient history: 87yo F with ams. EEG to evaluate for seizure  Level of alertness: comatose  AEDs during EEG study: Propofol, LEV,VPA  Technical aspects: This EEG study was done with scalp electrodes positioned according to the 10-20 International system of electrode placement. Electrical activity was acquired at a sampling rate of  and reviewed with a high frequency filter of  and a low frequency filter of . EEG data were recorded continuously and digitally stored.  Description: EEG initially showed continuous low amplitude 2-3hz  delta slowing in right hemisphere. There was also predominantly 5-8Hz  theta-alpha activity in left hemisphere admixed with intermittent 2-3hz  delta  slowing.  After around 2330 on 06/21/2021, as medications were adjusted, EEG was suggestive of burst attenuation pattern with bursts of low amplitude polymorphic asynchronous 2 to 3 Hz delta slowing with overriding 15 to 18 Hz beta activity lasting 2 to 3 seconds alternating with 5 to 8 seconds of generalized EEG attenuation. Patient event button was pressed on 06/22/2021 at 2001, 2016, 2216, 2038, 2253, 2309 and 2315 for left side neck jerking and possible left arm jerking ( covered under blanket).  Concomitant EEG initially showed sharply contoured 6 to 8 Hz theta-alpha activity in the right frontal temporal region which gradually evolved into 2 to 3 Hz delta slowing. Hyperventilation and photic stimulation were not performed.    ABNORMALITY - Focal seizure, right frontotemporal region  - Continuous slow, generalized and lateralized right hemisphere - Burst attenuation, generalized  IMPRESSION: This study showed focal seizures arising from right frontotemporal region on 06/22/2021 at 2001, 2016, 2216, 2038, 2253, 2309 and 2315, lasting about 2 minute each.  During the seizure patient was noted to have left-sided neck jerking and possible left arm jerking ( covered under blanket). EEG was also suggestive of cortical dysfunction arising from right hemisphere, nonspecific etiology but likely secondary to underlying structural abnormality, post-ictal state.  Additionally EEG was initially suggestive of moderate diffuse encephalopathy.  After around 2330 on 06/21/2021, as medications were adjusted, EEG was suggestive of profound diffuse encephalopathy, nonspecific etiology but likely could be secondary to sedation.  Priyanka O Yadav   VAS [place]  LOWER EXTREMITY VENOUS (DVT)  Result Date: 06/22/2021  Lower Venous DVT Study Patient Name:  LAZETTE ESTALA [facility]  Date of Exam:   06/22/2021 Medical Rec #: [id number]      Accession #:    [id number] Date of Birth: [birth year]      Patient Gender: F Patient Age:   57 years Exam  Location:  [facility] Procedure:      VAS [place] LOWER EXTREMITY VENOUS (DVT) Referring Phys: Scheryl Marten Shantika Bermea --------------------------------------------------------------------------------  Indications: Stroke.  Comparison Study: no prior Performing Technologist: [place] Ponder RVS  Examination Guidelines: A complete evaluation includes B-mode imaging, spectral Doppler, color Doppler, and power Doppler as needed of all accessible portions of each vessel. Bilateral testing is considered an integral part of a complete examination. Limited examinations for reoccurring indications may be performed as noted. The reflux portion of the exam is performed with the patient in reverse Trendelenburg.  +---------+---------------+---------+-----------+----------+--------------+  RIGHT     Compressibility Phasicity Spontaneity Properties Thrombus Aging  +---------+---------------+---------+-----------+----------+--------------+  CFV       Full            Yes       Yes                                    +---------+---------------+---------+-----------+----------+--------------+  SFJ       Full                                                             +---------+---------------+---------+-----------+----------+--------------+  FV Prox   Full                                                             +---------+---------------+---------+-----------+----------+--------------+  FV Mid    Full            Yes       Yes                                    +---------+---------------+---------+-----------+----------+--------------+  FV Distal Full            Yes       Yes                                    +---------+---------------+---------+-----------+----------+--------------+  PFV       Full                                                             +---------+---------------+---------+-----------+----------+--------------+  POP       Full            Yes       Yes                                     +---------+---------------+---------+-----------+----------+--------------+  PTV       Full                                                             +---------+---------------+---------+-----------+----------+--------------+  PERO      Full                                                             +---------+---------------+---------+-----------+----------+--------------+   +---------+---------------+---------+-----------+----------+--------------+  LEFT      Compressibility Phasicity Spontaneity Properties Thrombus Aging  +---------+---------------+---------+-----------+----------+--------------+  CFV       Full            Yes       Yes                                    +---------+---------------+---------+-----------+----------+--------------+  SFJ       Full                                                             +---------+---------------+---------+-----------+----------+--------------+  FV Prox   Full                                                             +---------+---------------+---------+-----------+----------+--------------+  FV Mid    Full                                                             +---------+---------------+---------+-----------+----------+--------------+  FV Distal                 Yes       Yes                                    +---------+---------------+---------+-----------+----------+--------------+  PFV       Full                                                             +---------+---------------+---------+-----------+----------+--------------+  POP       Full            Yes       Yes                                    +---------+---------------+---------+-----------+----------+--------------+  PTV       Full                                                             +---------+---------------+---------+-----------+----------+--------------+  PERO      Full                                                              +---------+---------------+---------+-----------+----------+--------------+     Summary: BILATERAL: - No evidence of deep vein thrombosis seen in the lower extremities, bilaterally. -No evidence of popliteal cyst, bilaterally.   *See table(s) above for measurements and observations. Electronically signed by Sherald Hess MD on 06/22/2021 at 7:48:31 PM.    Final    CT CHEST ABDOMEN PELVIS WO CONTRAST  Result Date: 07-09-2021 CLINICAL DATA:  Level 1 trauma. Fall. EXAM: CT CHEST, ABDOMEN AND PELVIS WITHOUT CONTRAST TECHNIQUE: Multidetector CT imaging of the chest, abdomen and pelvis was performed following the standard protocol without IV contrast. COMPARISON:  None similar FINDINGS: CT CHEST FINDINGS Cardiovascular: Normal heart size. Trace anterior pericardial effusion which appears low-density. Coronary atherosclerosis. Mediastinum/Nodes: Endotracheal and enteric tubes are in unremarkable position. No hematoma or pneumomediastinum. 3.2 cm left thyroid nodule, unchanged from 2020. This was evaluated by ultrasound in 2019. No follow-up recommended unless clinically warranted (ref: J Am Coll Radiol. 2015 Feb;12(2): 143-50). Lungs/Pleura: Bands of scarring in the upper lungs with mild, cylindrical left upper lobe bronchiectasis. Prior left apical surgery with lung sutures. No hemothorax, pneumothorax, or lung contusion. Musculoskeletal: Scoliosis.  No acute fracture or subluxation. CT ABDOMEN PELVIS FINDINGS Hepatobiliary: No hepatic injury or perihepatic hematoma. Gallbladder is unremarkable Pancreas: Unremarkable Spleen: No splenic injury or perisplenic hematoma. Adrenals/Urinary Tract: No adrenal hemorrhage or renal injury identified. Bladder is unremarkable. Bilateral nephrolithiasis. Adjacent or lobulated right lower pole calculus in total measuring 9 mm. 3 mm interpolar left renal calculus. Stomach/Bowel: No evidence of injury. Rectal stool distention. The enteric tube tip is at the stomach.  Vascular/Lymphatic: Stranding around the right femoral vessels related to phlebotomy per report. Atheromatous calcification. Reproductive: Hysterectomy Other: No ascites or pneumoperitoneum Musculoskeletal: Scoliosis and ordinary degeneration for age. No acute fracture or subluxation. IMPRESSION: No evidence of acute intrathoracic or intra-abdominal injury. Electronically Signed   By: Tiburcio Pea M.D.   On: 07/09/21 11:05   CT Maxillofacial Wo Contrast  Result Date: 07-09-2021 CLINICAL DATA:  Level 1 trauma.  Found down. EXAM: CT HEAD WITHOUT CONTRAST CT MAXILLOFACIAL WITHOUT CONTRAST CT CERVICAL SPINE WITHOUT CONTRAST TECHNIQUE: Multidetector CT imaging of the head, cervical spine, and maxillofacial structures were performed using the standard protocol without intravenous contrast. Multiplanar CT image reconstructions of the cervical spine and maxillofacial structures were also generated. COMPARISON:  None. FINDINGS: CT HEAD FINDINGS Brain: No evidence of acute infarction, hemorrhage, hydrocephalus, extra-axial collection or mass lesion/mass effect. 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 Vascular: No hyperdense vessel or unexpected calcification. Skull: No acute fracture CT MAXILLOFACIAL FINDINGS Osseous: No fracture or mandibular dislocation. No destructive process. Orbits: No evidence of injury Sinuses: Negative for hemosinus Soft tissues: No opaque foreign body or measurable hematoma. CT  CERVICAL SPINE FINDINGS Alignment: No traumatic malalignment. Skull base and vertebrae: No acute fracture. No primary bone lesion or focal pathologic process. Soft tissues and spinal canal: No prevertebral fluid or swelling. No visible canal hematoma. Disc levels: Generalized disc collapse with endplate and facet spurring narrowing foramina diffusely, foraminal narrowing especially along the right-side of a scoliotic curvature. Upper chest: Reported separately IMPRESSION: No evidence of acute  intracranial or cervical spine injury. Negative for facial fracture. Electronically Signed   By: Tiburcio Pea M.D.   On: 06/19/2021 11:07     PHYSICAL EXAM  Temp:  [98.4 F (36.9 C)-99.9 F (37.7 C)] 99 F (37.2 C) (12/16 1200) Pulse Rate:  [72-84] 77 (12/16 1200) Resp:  [16-20] 18 (12/16 1200) BP: (105-144)/(46-60) 132/56 (12/16 1200) SpO2:  [95 %-100 %] 100 % (12/16 1200) FiO2 (%):  [30 %-40 %] 30 % (12/16 1105)  General - Well nourished, well developed, intubated just off sedation.  Ophthalmologic - fundi not visualized due to noncooperation.  Cardiovascular - Regular rate and rhythm.  Neuro - intubated just off sedation, eyes closed, not following commands. With forced eye opening, eyes in mid position, not blinking to visual threat, doll's eyes absent, not tracking, right 1.13mm but reactive, left pupil pinpoint but irregular shape with evidence of prior ophthalmic surgery. Corneal reflex absent bilaterally, gag and cough present. Breathing over the vent.  Facial symmetry not able to test due to ET tube.  Tongue protrusion not cooperative. On pain stimulation, no significant movement of all extremities. No babinski. Sensation, coordination and gait not tested.   ASSESSMENT/PLAN Ms. MEG NIEMEIER is a 87 y.o. female with history of cerebral palsy, hypertension, diabetes, sarcoidosis admitted for #home with facial trauma, altered mental status. No tPA given due to unknown last known well.    Stroke:  right MCA and ACA infarcts embolic pattern secondary to unclear source, concerning for occult A. fib CT no acute abnormality MRI right MCA, MCA/PCA and MCA/ACA scattered infarcts CT head and neck unremarkable 2D Echo pending LE venous Doppler no DVT May consider prolonged cardiac monitoring for cardioembolic work-up if aggressive care LDL 25 HgbA1c 8.0 Heparin subcu for VTE prophylaxis aspirin 81 mg daily prior to admission, now on aspirin 325 mg daily given potential procedures  (trach or PEG) Ongoing aggressive stroke risk factor management Therapy recommendations: Pending Disposition: Pending, agree with CCM, if pt not make much progress over the weekend, will consider palliative care involvement.   Seizure Reported right facial twitching and right arm jerking movement Received Versed and propofol bolus EEG 12/13 cortical dysfunction arising from right hemisphere, nonspecific etiology  LTM EEG showed focal seizures arising from right frontotemporal region lasting about 2 minute each.  During the seizure patient was noted to have left-sided neck jerking and possible left arm jerking  Keppra load followed by 500 bid->1500 bid Depakote load x 2, continue 500 Q8 Depakote level 33 -> pending Now off sedation, if no Sz overnight, may consider d/c LTM  Fever Leukocytosis  Spiking fever 12/13, T-max 100.9->afebrile Today afebrile WBC 17.8-19.0-14.2-16.7 UA WBC 20-51 CXR unremarkable Blood culture NGTD On cefepime -> zosyn  Diabetes, uncontrolled HgbA1c 8.0 goal < 7.0 Uncontrolled Currently on Levemir 5->10->15 U twice daily CBG monitoring SSI DM education and close PCP follow up  Respiratory failure Intubated on sedation CCM on board Not candidate for extubation today  Hypertension Stable Long term BP goal normotensive  Other Stroke Risk Factors Advanced age  Other Active Problems Cerebral palsy Sarcoidosis  Hospital day # 3  This patient is critically ill due to right MCA and ACA infarcts, seizure, leukocytosis, respiratory failure, dysphagia and at significant risk of neurological worsening, death form recurrent stroke, status epilepticus, sepsis, heart failure. This patient's care requires constant monitoring of vital signs, hemodynamics, respiratory and cardiac monitoring, review of multiple databases, neurological assessment, discussion with family, other specialists and medical decision making of high complexity. I spent 35 minutes of  neurocritical care time in the care of this patient.   Marvel Plan, MD PhD Stroke Neurology 06/23/2021 1:04 PM    To contact Stroke Continuity provider, please refer to [email or web address]. After hours, contact General Neurology

## 2021-06-23 NOTE — Progress Notes (Signed)
Inpatient Diabetes Program Recommendations  AACE/ADA: New Consensus Statement on Inpatient Glycemic Control (2015)  Target Ranges:  Prepandial:   less than 140 mg/dL      Peak postprandial:   less than 180 mg/dL (1-2 hours)      Critically ill patients:  140 - 180 mg/dL   Lab Results  Component Value Date   GLUCAP 248 (H) 06/23/2021   HGBA1C 8.0 (H) 07-20-21    Review of Glycemic Control  Latest Reference Range & Units 06/22/21 15:43 06/22/21 19:25 06/22/21 23:34 06/23/21 03:25 06/23/21 07:42  Glucose-Capillary 70 - 99 mg/dL 544 (H) 920 (H) 100 (H) 244 (H) 248 (H)  (H): Data is abnormally high  Diabetes history: DM2 Outpatient Diabetes medications: Glipizide 10 mg BID Current orders for Inpatient glycemic control: Levemir 15 units Q12H (increased today), Novolog 0-15Q4H, tube feeds coverage 45 ml/hr continuous  Inpatient Diabetes Program Recommendations:    Levemir 10 BID Novolog 3 units Q4H tube feed coverage (stop is feeds are held or discontinued).  Will continue to follow while inpatient.  Thank you, Dulce Sellar, RN, BSN Diabetes Coordinator Inpatient Diabetes Program [phone] (team pager from 8a-5p)

## 2021-06-23 NOTE — Progress Notes (Signed)
K+3.3 ?Replaced per protocol  ?

## 2021-06-23 NOTE — Procedures (Addendum)
Patient Name: Julia [name]  MRN: [id number]  Epilepsy Attending: Charlsie Quest  Referring Physician/Provider: Dr Marvel Plan Duration: 06/22/2021 1445 to 06/23/2021 1445   Patient history: 87yo F with ams. EEG to evaluate for seizure   Level of alertness: comatose   AEDs during EEG study: Propofol, LEV,VPA   Technical aspects: This EEG study was done with scalp electrodes positioned according to the 10-20 International system of electrode placement. Electrical activity was acquired at a sampling rate of 500Hz  and reviewed with a high frequency filter of 70Hz  and a low frequency filter of 1Hz . EEG data were recorded continuously and digitally stored.    Description: EEG initially showed near continuous low amplitude 3 to 6 Hz theta-delta slowing admixed with 15 to 18 Hz generalized beta activity.  There was also intermittent 2 to 5 seconds of EEG attenuation. After propofol,was weaned, EEG showed continuous generalized and lateralized right hemisphere low amplitude 3 to 6 Hz theta -delta slowing   Two seizure without clinical signs were noted on 06/23/2021 at 1301 and 1323, lasting about 45 seconds each. Concomitant EEG showed 6-7hz  theta slowing in right posterior quadrant which then involved all of right hemisphere and evolved into 2-3hz  delta slowing with overriding 15-18hz  beta activity.   Hyperventilation and photic stimulation were not performed.      ABNORMALITY -  Seizure without clinical signs, right posterior quadrant - Continuous slow, generalized  and lateralized right hemisphere   IMPRESSION: This study showed two seizure without clinical signs on 06/23/2021 at 1301 and 1323, lasting about 45 seconds each arising from right posterior quadrant.    Additionally, EEG is suggestive of cortical dysfunction arising from right hemisphere, nonspecific etiology but likely secondary to underlying structural abnormality, post-ictal state.  Lastly, there is severe diffuse  encephalopathy, nonspecific etiology but could be secondary to sedation. No seizures or definite epileptiform discharges were seen during the study.   [name] 

## 2021-06-23 NOTE — Progress Notes (Signed)
NAME:  Julia [name], MRN:  [id number], DOB:  [birth year], LOS: 3 ADMISSION DATE:  07/03/2021, CONSULTATION DATE:  06/15/2021 REFERRING MD:  Dr. Vanita Panda, ER, CHIEF COMPLAINT:  AMS   History of Present Illness:  87 yo female found unresponsive at home.  Required intubation for airway protection.  Found to have leukocytosis, lactic acidosis, hyperglycemia, hypernatremia, HTN (BP 229/119).  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  Pertinent  Medical History  GERD, Esophageal stricture, UTI, Rhabdo, CP, Lt eye aphakia, Cataracts, CVA, HTN, DM type 2  Significant Hospital Events: Including procedures, antibiotic start and stop dates in addition to other pertinent events   12/13 Admit, neurology and trauma consulted 12/14 new onset seizure, start LTM  Studies:  CT head 12/13 >> no acute infarction/hemorrhage/hydrocephalus; remote Lt parieto-occipital infarct CT chest 12/13 >> 3.2 cm Lt thyroid nodule (stable since 2020), scarring in upper lungs with cylindrical BTX LUL, prior Lt apical lung surgery, scoliosis EEG 12/13 >> generalized slowing and Rt hemisphere lateralization MRI brain 12/13 >> Restricted diffusion in Rt frontal parietal cortex primarily in Rt MCA territory but also Rt ACA/PCA and Lt ACA/MCA territories; concerning for embolic CVA CT angio head/neck 12/14 >> no large vessel occlusions EEG 12/15 >> focal seizure, Rt frontotemporal region; generalized burst suppression; generalized slowing 12/16 - EEG slow, no seizure, comatose  Interim History / Subjective:  NAEON. Mental status poor.  Objective   Blood pressure (!) 140/57, pulse 79, temperature 98.8 F (37.1 C), resp. rate 18, height 5\' 2"  (1.575 m), weight 53.4 kg, SpO2 100 %.    Vent Mode: PRVC FiO2 (%):  [30 %-40 %] 30 % Set Rate:  [16 bmp] 16 bmp Vt Set:  [400 mL] 400 mL PEEP:  [5 cmH20] 5 cmH20 Plateau Pressure:  [14 cmH20-18 cmH20] 18 cmH20   Intake/Output Summary (Last 24 hours) at 06/23/2021 1056 Last data filed at  06/23/2021 [id number] Gross per 24 hour  Intake 3273.23 ml  Output 810 ml  Net 2463.23 ml    Filed Weights   07/04/2021 1031 06/21/21 0500 06/22/21 0441  Weight: 60 kg 48.2 kg 53.4 kg    Examination:  General - sedated Eyes - PERRL ENT - ETT in place Cardiac - regular rate/rhythm, no murmur Chest - equal breath sounds b/l, no wheezing or rales Abdomen - soft, non tender, + bowel sounds Extremities - lower extremity lymphedema Skin - no rashes Neuro - RASS -4  Resolved Hospital Problem list   Hyperkalemia, Lactic acidosis, Hypokalemia  Assessment & Plan:   Acute metabolic encephalopathy with acute embolic CVA and prior hx of CVA. New onset seizures. - AEDs per neurology - continue ASA -LE doppler negative, Echo pending  Compromised airway with acute hypoxic respiratory failure. - pressure support wean as able - mental status barrier to extubation trial at this time - f/u CXR intermittently - goal SpO2 > 92%  HTN emergency. - goal SBP < 180  DM type 2 poorly controlled with hyperglycemia. - SSI with levemir  Recurrent UTI. Aspiration pneumonitis, POA. - day 4/5 of ABx - fblood cultures from 12/13 NGTD  Anemia of critical illness. - f/u CBC - transfuse for Hb < 7  Best Practice (right click and "Reselect all SmartList Selections" daily)   Diet/type: tubefeeds DVT prophylaxis: prophylactic heparin  GI prophylaxis: PPI Lines: Central line Foley:  N/A Code Status:  full code Last date of multidisciplinary goals of care discussion [x]   Labs    CMP Latest Ref Rng & Units 06/23/2021 06/22/2021  06/21/2021  Glucose 70 - 99 mg/dL 962(I) 297(L) 892(J)  BUN 8 - 23 mg/dL 22 16 18   Creatinine 0.44 - 1.00 mg/dL 1.94 1.74  Sodium 135 - 145 mmol/L 143 146(H) 147(H)  Potassium 3.5 - 5.1 mmol/L 3.3(L) 3.7 4.3  Chloride 98 - 111 mmol/L 115(H) 114(H) 117(H)  CO2 22 - 32 mmol/L 22 22 23   Calcium 8.9 - 10.3 mg/dL 7.3(L) 8.2(L) 9.3  Total Protein 6.5 - 8.1 g/dL - - -   Total Bilirubin 0.3 - 1.2 mg/dL - - -  Alkaline Phos 38 - 126 U/L - - -  AST 15 - 41 U/L - - -  ALT 0 - 44 U/L - - -    CBC Latest Ref Rng & Units 06/23/2021 06/22/2021 06/21/2021  WBC 4.0 - 10.5 K/uL 16.7(H) 14.2(H) 19.0(H)  Hemoglobin 12.0 - 15.0 g/dL 06/24/2021) 06/23/2021) 4.4(Y  Hematocrit 36.0 - 46.0 % 29.7(L) 31.8(L) 39.9  Platelets 150 - 400 K/uL 170 176 249    ABG    Component Value Date/Time   PHART 7.451 (H) 07-11-21 1204   PCO2ART 31.8 (L) 07-11-21 1204   PO2ART 539 (H) 2021-07-11 1204   HCO3 22.1 07/11/2021 1204   TCO2 23 2021/07/11 1204   ACIDBASEDEF 1.0 2021/07/11 1204   O2SAT 100.0 2021-07-11 1204    CBG (last 3)  Recent Labs    06/22/21 2334 06/23/21 0325 06/23/21 0742  GLUCAP 215* 244* 248*     Critical care time:   CRITICAL CARE Performed by: 06/25/21 Jeff Frieden   Total critical care time: 35 minutes  Critical care time was exclusive of separately billable procedures and treating other patients.  Critical care was necessary to treat or prevent imminent or life-threatening deterioration.  Critical care was time spent personally by me on the following activities: development of treatment plan with patient and/or surrogate as well as nursing, discussions with consultants, evaluation of patient's response to treatment, examination of patient, obtaining history from patient or surrogate, ordering and performing treatments and interventions, ordering and review of laboratory studies, ordering and review of radiographic studies, pulse oximetry and re-evaluation of patient's condition.   06/25/21, MD [place] [facility]Critical [facility] Amion for contact info 06/23/2021, 10:56 AM

## 2021-06-23 NOTE — Progress Notes (Signed)
°  Echocardiogram 2D Echocardiogram has been performed.  Leta Jungling M 06/23/2021, 9:20 AM

## 2021-06-24 DIAGNOSIS — R4020 Unspecified coma: Secondary | ICD-10-CM

## 2021-06-24 LAB — BASIC METABOLIC PANEL
Anion gap: 6 (ref 5–15)
BUN: 16 mg/dL (ref 8–23)
CO2: 25 mmol/L (ref 22–32)
Calcium: 7.6 mg/dL — ABNORMAL LOW (ref 8.9–10.3)
Chloride: 117 mmol/L — ABNORMAL HIGH (ref 98–111)
Creatinine, Ser: 0.6 mg/dL (ref 0.44–1.00)
GFR, Estimated: >60 mL/min (ref >60)
Glucose, Bld: 209 mg/dL — ABNORMAL HIGH (ref 70–99)
Potassium: 3.6 mmol/L (ref 3.5–5.1)
Sodium: 148 mmol/L — ABNORMAL HIGH (ref 135–145)

## 2021-06-24 LAB — TRIGLYCERIDES: Triglycerides: 26 mg/dL (ref <150)

## 2021-06-24 LAB — GLUCOSE, CAPILLARY
Glucose-Capillary: 190 mg/dL — ABNORMAL HIGH (ref 70–99)
Glucose-Capillary: 170 mg/dL — ABNORMAL HIGH (ref 70–99)
Glucose-Capillary: 206 mg/dL — ABNORMAL HIGH (ref 70–99)
Glucose-Capillary: 239 mg/dL — ABNORMAL HIGH (ref 70–99)
Glucose-Capillary: 203 mg/dL — ABNORMAL HIGH (ref 70–99)
Glucose-Capillary: 153 mg/dL — ABNORMAL HIGH (ref 70–99)
Glucose-Capillary: 185 mg/dL — ABNORMAL HIGH (ref 70–99)

## 2021-06-24 LAB — VALPROIC ACID LEVEL: Valproic Acid Lvl: 55 ug/mL (ref 50.0–100.0)

## 2021-06-24 MED ORDER — POTASSIUM CHLORIDE 20 MEQ PO PACK
40.0000 meq | PACK | Freq: Once | ORAL | Status: AC
  Administered 2021-06-24: 40 meq
  Filled 2021-06-24: qty 2

## 2021-06-24 MED ORDER — FREE WATER
200.0000 mL | Freq: Four times a day (QID) | Status: DC
  Administered 2021-06-24 – 2021-06-25 (×6): 200 mL

## 2021-06-24 MED ORDER — FUROSEMIDE 10 MG/ML IJ SOLN
40.0000 mg | Freq: Once | INTRAMUSCULAR | Status: AC
  Administered 2021-06-24: 40 mg via INTRAVENOUS
  Filled 2021-06-24: qty 4

## 2021-06-24 MED ORDER — VALPROATE SODIUM 100 MG/ML IV SOLN
1000.0000 mg | Freq: Once | INTRAVENOUS | Status: AC
  Administered 2021-06-24: 1000 mg via INTRAVENOUS
  Filled 2021-06-24: qty 10

## 2021-06-24 MED ORDER — MAGNESIUM SULFATE 2 GM/50ML IV SOLN
2.0000 g | Freq: Once | INTRAVENOUS | Status: AC
  Administered 2021-06-24: 2 g via INTRAVENOUS
  Filled 2021-06-24: qty 50

## 2021-06-24 MED ORDER — VALPROATE SODIUM 100 MG/ML IV SOLN
750.0000 mg | Freq: Three times a day (TID) | INTRAVENOUS | Status: DC
  Administered 2021-06-24 – 2021-06-25 (×3): 750 mg via INTRAVENOUS
  Filled 2021-06-24 (×5): qty 7.5

## 2021-06-24 NOTE — Progress Notes (Addendum)
LTM maint complete - no skin breakdown under: Fp1 F3 F7. Serviced EEG ECg leads [facility] monitored, Event button test confirmed by [facility].

## 2021-06-24 NOTE — Progress Notes (Signed)
LTM maintain done at bedside. No skin breakdown noted. Results pending ° °

## 2021-06-24 NOTE — Procedures (Addendum)
Patient Name: Julia [name]  MRN: [id number]  Epilepsy Attending: Charlsie Quest  Referring Physician/Provider: Dr Marvel Plan Duration: 06/23/2021 1445 to 06/24/2021 1445   Patient history: 87yo F with ams. EEG to evaluate for seizure   Level of alertness: comatose   AEDs during EEG study: LEV,VPA   Technical aspects: This EEG study was done with scalp electrodes positioned according to the 10-20 International system of electrode placement. Electrical activity was acquired at a sampling rate of 500Hz  and reviewed with a high frequency filter of 70Hz  and a low frequency filter of 1Hz . EEG data were recorded continuously and digitally stored.    Description: EEG showed continuous generalized and lateralized right hemisphere 3 to 6 Hz theta -delta slowing which at times appear rhythmic. Lateralized periodic discharges were also noted in right hemisphere, maximal right posterior quadrant, with fluctuating frequency of 0.5-1hz .    Seizure without clinical signs were noted, average 5-7/hour, lasting about 45 seconds. Concomitant EEG showed 6-7hz  theta slowing in right posterior quadrant which then involved all of right hemisphere and evolved into 2-3hz  delta slowing with overriding 15-18hz  beta activity. Average duration about 45 seconds.   Hyperventilation and photic stimulation were not performed.      ABNORMALITY -  Seizure without clinical signs, right posterior quadrant - Lateralized periodic discharges, right hemisphere, maximal right posterior quadrant - Continuous slow, generalized  and lateralized right hemisphere   IMPRESSION: This study showed seizure without clinical signs, average 5-7/hour,  lasting about 45 seconds each arising from right posterior quadrant.     Additionally, EEG showed evidence of epileptogenicity and cortical dysfunction arising from right hemisphere, maximal right posterior quadrant likely secondary to underlying structural abnormality.   Lastly, there is  severe diffuse encephalopathy, nonspecific etiology but could be secondary to sedation.   Dr was notified.   Albertine Lafoy 

## 2021-06-24 NOTE — Progress Notes (Signed)
STROKE TEAM PROGRESS NOTE   SUBJECTIVE (INTERVAL HISTORY) RN is at the bedside. Pt still intubated, for respiratory failure.   Off sedation, still not responsive, poor brainstem reflexes. LTM EEG 2 brief electrographic seizures without clinical correlation arising from the right posterior hemisphere.  Severe diffuse encephalopathy.  Depakote level 55, will give another loading dose.  Of 1 gm and increase maintenance to 750 every 8 hourly.  Agree with CCM, if pt not improving over the weekend, palliative care involvement is reasonable.  No family available at the bedside.  Neurological exam is unchanged.  Vital signs stable  OBJECTIVE Temp:  [97.9 F (36.6 C)-99.5 F (37.5 C)] 97.9 F (36.6 C) (12/17 1115) Pulse Rate:  [72-89] 76 (12/17 1000) Cardiac Rhythm: Normal sinus rhythm (12/17 0400) Resp:  [16-24] 20 (12/17 1000) BP: (137-203)/(54-76) 170/76 (12/17 1000) SpO2:  [97 %-100 %] 99 % (12/17 1107) FiO2 (%):  [30 %] 30 % (12/17 0800) Weight:  [57.6 kg] 57.6 kg (12/17 0350)  Recent Labs  Lab 06/23/21 2315 06/24/21 0317 06/24/21 0714 06/24/21 0924 06/24/21 1115  GLUCAP 196* 190* 170* 206* 239*   Recent Labs  Lab 06/24/2021 2126 06/21/21 0347 06/22/21 0437 06/23/21 0412 06/24/21 0250  NA 146* 147* 146* 143 148*  K 5.0 4.3 3.7 3.3* 3.6  CL 118* 117* 114* 115* 117*  CO2 22 23 22 22 25   GLUCOSE 166* 153* 285* 305* 209*  BUN 18 18 16 22 16   CREATININE 0.90 0.94 0.81 0.90 0.60  CALCIUM 9.3 9.3 8.2* 7.3* 7.6*  MG  --  1.9 1.5*  --   --   PHOS  --  1.7* 3.9  --   --    Recent Labs  Lab 06/19/2021 1126  AST 25  ALT 13  ALKPHOS 95  BILITOT 1.1  PROT 6.6  ALBUMIN 3.1*   Recent Labs  Lab 07/06/2021 1017 06/23/2021 1034 07/07/2021 1204 06/21/21 0347 06/22/21 0437 06/23/21 0412  WBC 17.8*  --   --  19.0* 14.2* 16.7*  HGB 14.4 16.7* 15.0 12.3 9.8* 9.1*  HCT 47.2* 49.0* 44.0 39.9 31.8* 29.7*  MCV 75.0*  --   --  75.1* 75.0* 74.6*  PLT 290  --   --  249 176 170   No results  for input(s): CKTOTAL, CKMB, CKMBINDEX, TROPONINI in the last 168 hours. No results for input(s): LABPROT, INR in the last 72 hours.  No results for input(s): COLORURINE, LABSPEC, [place], GLUCOSEU, HGBUR, BILIRUBINUR, KETONESUR, PROTEINUR, UROBILINOGEN, NITRITE, LEUKOCYTESUR in the last 72 hours.  Invalid input(s): APPERANCEUR      Component Value Date/Time   CHOL 94 06/21/2021 1114   TRIG 26 06/24/2021 0250   HDL 61 06/21/2021 1114   CHOLHDL 1.5 06/21/2021 1114   VLDL 8 06/21/2021 1114   LDLCALC 25 06/21/2021 1114   Lab Results  Component Value Date   HGBA1C 8.0 (H) 06/11/2021   No results found for: LABOPIA, [place], [place], [place], [place], [place]  Recent Labs  Lab 06/08/2021 1126  [place] <10    I have personally reviewed the radiological images below and agree with the radiology interpretations.  CT ANGIO HEAD NECK W WO CM  Result Date: 06/21/2021 CLINICAL DATA:  Stroke/TIA, determine embolic source EXAM: CT ANGIOGRAPHY HEAD AND NECK TECHNIQUE: Multidetector CT imaging of the head and neck was performed using the standard protocol during bolus administration of intravenous contrast. Multiplanar CT image reconstructions and MIPs were obtained to evaluate the vascular anatomy. Carotid stenosis measurements (when applicable) are  obtained utilizing NASCET criteria, using the distal internal carotid diameter as the denominator. CONTRAST:  8mL OMNIPAQUE IOHEXOL 350 MG/ML SOLN COMPARISON:  CT and MRI brain 06/24/2021; MRA head 2015 FINDINGS: CT HEAD Brain: There is no acute intracranial hemorrhage. There is new loss of gray-white differentiation corresponding to some of the areas of infarction seen on the MRI, particularly in the right hemisphere. There is no mass effect. No hydrocephalus. Stable chronic infarcts and chronic microvascular ischemic changes. Vascular: No new finding. Skull: Calvarium is unremarkable. Sinuses/Orbits: No acute finding. Other: None. Review of the MIP  images confirms the above findings CTA NECK Aortic arch: Mild calcified plaque along the arch. Great vessel origins appear. Right carotid system: Patent.  No stenosis. Left carotid system: Patent. Plaque at the ICA origin causes less than 50% stenosis. Vertebral arteries: Patent.  Codominant.  No stenosis. Skeleton: Degenerative changes of the included spine. Other neck: Enlarged, heterogeneous thyroid previously evaluated by ultrasound. Endotracheal and enteric tubes are present Upper chest: Scarring with traction bronchiectasis. Review of the MIP images confirms the above findings CTA HEAD Anterior circulation: Intracranial internal carotid arteries are patent with calcified plaque causing mild stenosis. Anterior and middle cerebral arteries are patent. Atherosclerotic irregularity of distal branches. Posterior circulation: Intracranial vertebral arteries are patent. Basilar artery is patent. Major cerebellar artery origins are patent. Right posterior communicating artery is present. Posterior cerebral arteries are patent. Venous sinuses: Patent as allowed by contrast bolus timing. Review of the MIP images confirms the above findings IMPRESSION: No large vessel occlusion or hemodynamically significant stenosis in the neck. No proximal intracranial vessel occlusion. Atherosclerotic irregularity of medium to small vessel branches in the anterior circulation. Electronically Signed   By: Macy Mis M.D.   On: 06/21/2021 14:48   DG Abd 1 View  Result Date: 06/21/2021 CLINICAL DATA:  NG tube placement. EXAM: ABDOMEN - 1 VIEW COMPARISON:  CT scan 06/26/2021 FINDINGS: The bowel gas pattern is unremarkable. The NG tube tip is in the antropyloric region of the stomach. IMPRESSION: NG tube tip is in the antropyloric region of the stomach. Electronically Signed   By: Marijo Sanes M.D.   On: 06/21/2021 13:37   CT Head Wo Contrast  Result Date: 07/02/2021 CLINICAL DATA:  Level 1 trauma.  Found down. EXAM: CT HEAD  WITHOUT CONTRAST CT MAXILLOFACIAL WITHOUT CONTRAST CT CERVICAL SPINE WITHOUT CONTRAST TECHNIQUE: Multidetector CT imaging of the head, cervical spine, and maxillofacial structures were performed using the standard protocol without intravenous contrast. Multiplanar CT image reconstructions of the cervical spine and maxillofacial structures were also generated. COMPARISON:  None. FINDINGS: CT HEAD FINDINGS Brain: No evidence of acute infarction, hemorrhage, hydrocephalus, extra-axial collection or mass lesion/mass effect. 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 Vascular: No hyperdense vessel or unexpected calcification. Skull: No acute fracture CT MAXILLOFACIAL FINDINGS Osseous: No fracture or mandibular dislocation. No destructive process. Orbits: No evidence of injury Sinuses: Negative for hemosinus Soft tissues: No opaque foreign body or measurable hematoma. CT CERVICAL SPINE FINDINGS Alignment: No traumatic malalignment. Skull base and vertebrae: No acute fracture. No primary bone lesion or focal pathologic process. Soft tissues and spinal canal: No prevertebral fluid or swelling. No visible canal hematoma. Disc levels: Generalized disc collapse with endplate and facet spurring narrowing foramina diffusely, foraminal narrowing especially along the right-side of a scoliotic curvature. Upper chest: Reported separately IMPRESSION: No evidence of acute intracranial or cervical spine injury. Negative for facial fracture. Electronically Signed   By: Jorje Guild M.D.   On:  07/06/2021 11:07   CT Cervical Spine Wo Contrast  Result Date: 06/17/2021 CLINICAL DATA:  Level 1 trauma.  Found down. EXAM: CT HEAD WITHOUT CONTRAST CT MAXILLOFACIAL WITHOUT CONTRAST CT CERVICAL SPINE WITHOUT CONTRAST TECHNIQUE: Multidetector CT imaging of the head, cervical spine, and maxillofacial structures were performed using the standard protocol without intravenous contrast. Multiplanar CT image reconstructions  of the cervical spine and maxillofacial structures were also generated. COMPARISON:  None. FINDINGS: CT HEAD FINDINGS Brain: No evidence of acute infarction, hemorrhage, hydrocephalus, extra-axial collection or mass lesion/mass effect. 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 Vascular: No hyperdense vessel or unexpected calcification. Skull: No acute fracture CT MAXILLOFACIAL FINDINGS Osseous: No fracture or mandibular dislocation. No destructive process. Orbits: No evidence of injury Sinuses: Negative for hemosinus Soft tissues: No opaque foreign body or measurable hematoma. CT CERVICAL SPINE FINDINGS Alignment: No traumatic malalignment. Skull base and vertebrae: No acute fracture. No primary bone lesion or focal pathologic process. Soft tissues and spinal canal: No prevertebral fluid or swelling. No visible canal hematoma. Disc levels: Generalized disc collapse with endplate and facet spurring narrowing foramina diffusely, foraminal narrowing especially along the right-side of a scoliotic curvature. Upper chest: Reported separately IMPRESSION: No evidence of acute intracranial or cervical spine injury. Negative for facial fracture. Electronically Signed   By: Jorje Guild M.D.   On: 06/15/2021 11:07   MR BRAIN WO CONTRAST  Result Date: 06/09/2021 CLINICAL DATA:  Neuro deficit, stroke suspected EXAM: MRI HEAD WITHOUT CONTRAST TECHNIQUE: Multiplanar, multiecho pulse sequences of the brain and surrounding structures were obtained without intravenous contrast. COMPARISON:  05/17/2019. FINDINGS: Brain: Areas of cortical restricted diffusion with ADC correlate in the right MCA territory (series 5, image 95, for example), with additional smaller areas of cortical restricted diffusion in the right PCA territory (series 5, image 76-7) and right ACA territory (series 5, image 97-99), with additional more punctate right MCA white matter infarcts (series 5, image 86 for example), and with  additional scattered left frontal infarcts (series 5, images 88 and 92). No significant gyral swelling. No acute hemorrhage, mass, mass effect, or midline shift. Confluent T2 hyperintense signal in the periventricular white matter and pons, likely the sequela of severe chronic small vessel ischemic disease. Remote left parietal infarct. Vascular: Normal flow voids. Skull and upper cervical spine: Choose 1 Sinuses/Orbits: Mucosal thickening in the right frontal sinus, right ethmoid air cells, right sphenoid sinus, and right maxillary sinus. Status post bilateral lens replacements. Other: None. IMPRESSION: Restricted diffusion in the right frontal parietal cortex, primarily in the right MCA territory, but also to a lesser extent in the right ACA, right PCA, and left ACA and MCA territory. This is overall concerning for an embolic etiology. No evidence of hemorrhagic conversion, mass effect, or midline shift. These results will be called to the ordering clinician or representative by the Radiologist Assistant, and communication documented in the PACS or [employer]. Electronically Signed   By: Merilyn Baba M.D.   On: 06/19/2021 23:39   DG Pelvis Portable  Result Date: 07/05/2021 CLINICAL DATA:  Trauma EXAM: PORTABLE PELVIS 1-2 VIEWS COMPARISON:  None. FINDINGS: Patient is rotated to the left which somewhat evaluation. There is no evidence of pelvic fracture or diastasis. No pelvic bone lesions are seen. Limited evaluation of the sacrum due to overlying bowel gas. IMPRESSION: No acute osseous abnormality. Electronically Signed   By: Yetta Glassman M.D.   On: 07/07/2021 10:46   DG Chest Port 1 View  Result Date: 06/23/2021 CLINICAL  DATA:  Respiratory failure EXAM: PORTABLE CHEST 1 VIEW COMPARISON:  Chest radiograph 06/21/2021 FINDINGS: The endotracheal tube tip is approximately 3.4 cm from the carina. The left sided vascular catheter terminates at the cavoatrial junction. The enteric catheter tip  projects over the region of the pylorus. The cardiomediastinal silhouette is stable. There are likely small bilateral pleural effusions with adjacent subsegmental atelectasis. Otherwise, there is no new focal consolidation. There is no pulmonary edema. There is no appreciable pneumothorax. Chain sutures in the left upper lobe are unchanged. IMPRESSION: Small bilateral pleural effusions with adjacent atelectasis, increased in conspicuity since 06/21/2021. Electronically Signed   By: Valetta Mole M.D.   On: 06/23/2021 09:22   DG CHEST PORT 1 VIEW  Result Date: 06/21/2021 CLINICAL DATA:  Shortness of breath EXAM: PORTABLE CHEST 1 VIEW COMPARISON:  Previous studies including the examination of 06/23/2021 FINDINGS: Apparent shift of mediastinum to the left may be due to rotation. Transverse diameter of heart is within normal limits. Thoracic aorta is tortuous and ectatic. Tip of endotracheal tube is 4.1 cm above the carina. Tip of left jugular central venous catheter is seen at the junction of superior vena cava and right atrium. Tip of enteric tube is seen in the stomach. Side-port in the enteric tube is in the lower thoracic esophagus close to gastroesophageal junction. Linear densities in the right upper lung fields may suggest scarring or subsegmental atelectasis. There is no new focal pulmonary consolidation. There is no pleural effusion or pneumothorax. IMPRESSION: There are no new infiltrates or signs of pulmonary edema. Side-port in enteric tube is seen in the lower thoracic esophagus close to gastroesophageal junction. Enteric tube could be advanced 5-10 cm to place the side port within the stomach. Electronically Signed   By: Elmer Picker M.D.   On: 06/21/2021 08:08   DG Chest Portable 1 View  Result Date: 06/25/2021 CLINICAL DATA:  Central line placement EXAM: PORTABLE CHEST 1 VIEW COMPARISON:  Previous studies including examination done earlier today. FINDINGS: Cardiac size is within normal  limits. Thoracic aorta is tortuous and ectatic. Tip of endotracheal tube is approximately 3.6 cm above the carina. Enteric tube is noted traversing the esophagus with its side port at the gastroesophageal junction. There is interval placement of left internal jugular central venous catheter with its tip in the region of junction of superior vena cava and right atrium. Linear density is seen in the right upper lung fields and left mid lung fields suggesting with no significant interval change suggesting scarring or subsegmental atelectasis. There is no new focal consolidation. There are no signs of alveolar pulmonary edema. IMPRESSION: There are no new infiltrates or signs of pulmonary edema. Linear densities in the right upper lobe and left mid lung fields may suggest scarring or subsegmental atelectasis. Other findings as described in the body of the report. Electronically Signed   By: Elmer Picker M.D.   On: 06/28/2021 12:59   DG Chest Port 1 View  Result Date: 06/19/2021 CLINICAL DATA:  Trauma EXAM: PORTABLE CHEST 1 VIEW COMPARISON:  Chest x-ray dated November 02, 2020 FINDINGS: ET tube tip projects approximately 1.0 cm from the carina. Enteric tube partially seen coursing below diaphragm. Evaluation is limited due to left port patient rotation. Bilateral upper lung linear opacities are unchanged compared to prior exam likely due to scarring or atelectasis. No focal consolidation. No large pleural effusion or evidence of pneumothorax. Limited evaluation of cardiac and mediastinal contours due to patient rotation. IMPRESSION: No acute cardiopulmonary no  acute cardiopulmonary abnormality, evaluation somewhat limited due to patient rotation. Electronically Signed   By: Yetta Glassman M.D.   On: 06/13/2021 10:44   EEG adult  Result Date: 07/06/2021 Lora Havens, MD     06/26/2021  2:29 PM Patient Name: Julia [name] MRN: [id number] Epilepsy Attending: Lora Havens Referring Physician/Provider:  Dr Ina Homes Date: 06/09/2021 Duration: 24.16 mins Patient history: 87yo F with ams. EEG to evaluate for seizure Level of alertness: comatose AEDs during EEG study: Propofol Technical aspects: This EEG study was done with scalp electrodes positioned according to the 10-20 International system of electrode placement. Electrical activity was acquired at a sampling rate of 500Hz  and reviewed with a high frequency filter of 70Hz  and a low frequency filter of 1Hz . EEG data were recorded continuously and digitally stored. Description: EEG showed continuous low amplitude 2-3hz  delta slowing in right hemisphere. There was also predominantly 5-8Hz  theta-alpha activity in left hemisphere admixed with intermittent 2-3hz  delta slowing. Hyperventilation and photic stimulation were not performed.   ABNORMALITY - Continuous slow, generalized and lateralized right hemisphere IMPRESSION: This study is suggestive of cortical dysfunction arising from right hemisphere, nonspecific etiology but likely secondary to underlying structural abnormality, post-ictal state. Additionally there is moderate diffuse encephalopathy, nonspecific etiology but likely could be secondary to sedation. No seizures or epileptiform discharges were seen throughout the recording. Dr Rory Percy was notified. Priyanka Barbra Sarks   Overnight EEG with video  Result Date: 06/22/2021 Lora Havens, MD     06/23/2021  9:24 AM Patient Name: Julia [name] MRN: [id number] Epilepsy Attending: Lora Havens Referring Physician/Provider: Dr Rosalin Hawking Duration: 06/21/2021 1445 to 06/22/2021 1445  Patient history: 87yo F with ams. EEG to evaluate for seizure  Level of alertness: comatose  AEDs during EEG study: Propofol, LEV,VPA  Technical aspects: This EEG study was done with scalp electrodes positioned according to the 10-20 International system of electrode placement. Electrical activity was acquired at a sampling rate of 500Hz  and reviewed with a high frequency  filter of 70Hz  and a low frequency filter of 1Hz . EEG data were recorded continuously and digitally stored.  Description: EEG initially showed continuous low amplitude 2-3hz  delta slowing in right hemisphere. There was also predominantly 5-8Hz  theta-alpha activity in left hemisphere admixed with intermittent 2-3hz  delta slowing.  After around 2330 on 06/21/2021, as medications were adjusted, EEG was suggestive of burst attenuation pattern with bursts of low amplitude polymorphic asynchronous 2 to 3 Hz delta slowing with overriding 15 to 18 Hz beta activity lasting 2 to 3 seconds alternating with 5 to 8 seconds of generalized EEG attenuation. Patient event button was pressed on 06/22/2021 at 2001, 2016, 2216, 2038, 2253, 2309 and 2315 for left side neck jerking and possible left arm jerking ( covered under blanket).  Concomitant EEG initially showed sharply contoured 6 to 8 Hz theta-alpha activity in the right frontal temporal region which gradually evolved into 2 to 3 Hz delta slowing. Hyperventilation and photic stimulation were not performed.    ABNORMALITY - Focal seizure, right frontotemporal region  - Continuous slow, generalized and lateralized right hemisphere - Burst attenuation, generalized  IMPRESSION: This study showed focal seizures arising from right frontotemporal region on 06/22/2021 at 2001, 2016, 2216, 2038, 2253, 2309 and 2315, lasting about 2 minute each.  During the seizure patient was noted to have left-sided neck jerking and possible left arm jerking ( covered under blanket). EEG was also suggestive of cortical dysfunction arising from right hemisphere, nonspecific  etiology but likely secondary to underlying structural abnormality, post-ictal state.  Additionally EEG was initially suggestive of moderate diffuse encephalopathy.  After around 2330 on 06/21/2021, as medications were adjusted, EEG was suggestive of profound diffuse encephalopathy, nonspecific etiology but likely could be secondary  to sedation.  Lora Havens   ECHOCARDIOGRAM COMPLETE  Result Date: 06/23/2021    ECHOCARDIOGRAM REPORT   Patient Name:   Julia [name] [facility] Date of Exam: 06/23/2021 Medical Rec #:  [id number]     Height:       62.0 in Accession #:    [id number]    Weight:       117.7 lb Date of Birth:  1931/10/29     BSA:          1.526 m Patient Age:    39 years      BP:           149/58 mmHg Patient Gender: F             HR:           80 bpm. Exam Location:  Inpatient Procedure: 2D Echo, 3D Echo, Cardiac Doppler, Color Doppler and Strain Analysis Indications:    Stroke I63.9  History:        Patient has no prior history of Echocardiogram examinations.                 Signs/Symptoms:Fever; Risk Factors:Diabetes and Hypertension.  Sonographer:    Darlina Sicilian RDCS Referring Phys: [id number] [facility]  Sonographer Comments: Echo performed with patient supine and on artificial respirator. IMPRESSIONS  1. Left ventricular ejection fraction, by estimation, is 65 to 70%. The left ventricle has normal function. The left ventricle has no regional wall motion abnormalities. There is mild left ventricular hypertrophy of the basal segment. Left ventricular diastolic parameters are consistent with Grade I diastolic dysfunction (impaired relaxation).  2. Right ventricular systolic function is normal. The right ventricular size is normal. Tricuspid regurgitation signal is inadequate for assessing PA pressure.  3. The mitral valve is grossly normal. No evidence of mitral valve regurgitation.  4. The aortic valve is grossly normal. Aortic valve regurgitation is not visualized.  5. The inferior vena cava is normal in size with greater than 50% respiratory variability, suggesting right atrial pressure of 3 mmHg. Comparison(s): No prior Echocardiogram. Conclusion(s)/Recommendation(s): Normal biventricular function without evidence of hemodynamically significant valvular heart disease. FINDINGS  Left Ventricle: Left ventricular ejection fraction,  by estimation, is 65 to 70%. The left ventricle has normal function. The left ventricle has no regional wall motion abnormalities. Global longitudinal strain performed but not reported based on interpreter judgement due to suboptimal tracking. The left ventricular internal cavity size was normal in size. There is mild left ventricular hypertrophy of the basal segment. Left ventricular diastolic parameters are consistent with Grade I diastolic dysfunction (impaired relaxation). Right Ventricle: The right ventricular size is normal. No increase in right ventricular wall thickness. Right ventricular systolic function is normal. Tricuspid regurgitation signal is inadequate for assessing PA pressure. Left Atrium: Left atrial size was normal in size. Right Atrium: Right atrial size was normal in size. Pericardium: There is no evidence of pericardial effusion. Mitral Valve: The mitral valve is grossly normal. No evidence of mitral valve regurgitation. Tricuspid Valve: The tricuspid valve is grossly normal. Tricuspid valve regurgitation is not demonstrated. Aortic Valve: Focal calcification. The aortic valve is grossly normal. Aortic valve regurgitation is not visualized. Pulmonic Valve: The pulmonic valve was grossly normal. Pulmonic  valve regurgitation is not visualized. Aorta: The aortic root and ascending aorta are structurally normal, with no evidence of dilitation. Venous: The inferior vena cava is normal in size with greater than 50% respiratory variability, suggesting right atrial pressure of 3 mmHg. IAS/Shunts: No atrial level shunt detected by color flow Doppler.  LEFT VENTRICLE PLAX 2D LVIDd:         3.60 cm   Diastology LVIDs:         2.00 cm   LV e' medial:    5.88 cm/s LV PW:         1.00 cm   LV E/e' medial:  16.1 LV IVS:        1.50 cm   LV e' lateral:   6.20 cm/s LVOT diam:     1.70 cm   LV E/e' lateral: 15.3 LV SV:         57 LV SV Index:   37 LVOT Area:     2.27 cm                           3D Volume EF:                           3D EF:        71 %                          LV EDV:       68 ml                          LV ESV:       20 ml                          LV SV:        49 ml RIGHT VENTRICLE RV S prime:     11.60 cm/s TAPSE (M-mode): 2.4 cm LEFT ATRIUM             Index        RIGHT ATRIUM          Index LA diam:        3.10 cm 2.03 cm/m   RA Area:     6.94 cm LA Vol (A2C):   20.5 ml 13.43 ml/m  RA Volume:   11.80 ml 7.73 ml/m LA Vol (A4C):   36.5 ml 23.92 ml/m LA Biplane Vol: 27.7 ml 18.15 ml/m  AORTIC VALVE LVOT Vmax:   115.00 cm/s LVOT Vmean:  82.800 cm/s LVOT VTI:    0.252 m  AORTA Ao Root diam: 2.80 cm Ao Asc diam:  3.00 cm MITRAL VALVE MV Area (PHT): 3.65 cm     SHUNTS MV Decel Time: 208 msec     Systemic VTI:  0.25 m MV E velocity: 94.70 cm/s   Systemic Diam: 1.70 cm MV A velocity: 110.00 cm/s MV E/A ratio:  0.86 Mary Scientist, physiological signed by Phineas Inches Signature Date/Time: 06/23/2021/2:28:46 PM    Final    VAS [place] LOWER EXTREMITY VENOUS (DVT)  Result Date: 06/22/2021  Lower Venous DVT Study Patient Name:  Julia [name] [facility]  Date of Exam:   06/22/2021 Medical Rec #: [id number]      Accession #:    [id number] Date of Birth: [birth year]      Patient  Gender: F Patient Age:   75 years Exam Location:  [facility] Procedure:      VAS [place] LOWER EXTREMITY VENOUS (DVT) Referring Phys: Cornelius Moras XU --------------------------------------------------------------------------------  Indications: Stroke.  Comparison Study: no prior Performing Technologist: Archie Patten RVS  Examination Guidelines: A complete evaluation includes B-mode imaging, spectral Doppler, color Doppler, and power Doppler as needed of all accessible portions of each vessel. Bilateral testing is considered an integral part of a complete examination. Limited examinations for reoccurring indications may be performed as noted. The reflux portion of the exam is performed with the patient in reverse Trendelenburg.   +---------+---------------+---------+-----------+----------+--------------+  RIGHT     Compressibility Phasicity Spontaneity Properties Thrombus Aging  +---------+---------------+---------+-----------+----------+--------------+  CFV       Full            Yes       Yes                                    +---------+---------------+---------+-----------+----------+--------------+  SFJ       Full                                                             +---------+---------------+---------+-----------+----------+--------------+  FV Prox   Full                                                             +---------+---------------+---------+-----------+----------+--------------+  FV Mid    Full            Yes       Yes                                    +---------+---------------+---------+-----------+----------+--------------+  FV Distal Full            Yes       Yes                                    +---------+---------------+---------+-----------+----------+--------------+  PFV       Full                                                             +---------+---------------+---------+-----------+----------+--------------+  POP       Full            Yes       Yes                                    +---------+---------------+---------+-----------+----------+--------------+  PTV       Full                                                             +---------+---------------+---------+-----------+----------+--------------+  PERO      Full                                                             +---------+---------------+---------+-----------+----------+--------------+   +---------+---------------+---------+-----------+----------+--------------+  LEFT      Compressibility Phasicity Spontaneity Properties Thrombus Aging  +---------+---------------+---------+-----------+----------+--------------+  CFV       Full            Yes       Yes                                     +---------+---------------+---------+-----------+----------+--------------+  SFJ       Full                                                             +---------+---------------+---------+-----------+----------+--------------+  FV Prox   Full                                                             +---------+---------------+---------+-----------+----------+--------------+  FV Mid    Full                                                             +---------+---------------+---------+-----------+----------+--------------+  FV Distal                 Yes       Yes                                    +---------+---------------+---------+-----------+----------+--------------+  PFV       Full                                                             +---------+---------------+---------+-----------+----------+--------------+  POP       Full            Yes       Yes                                    +---------+---------------+---------+-----------+----------+--------------+  PTV       Full                                                             +---------+---------------+---------+-----------+----------+--------------+  PERO      Full                                                             +---------+---------------+---------+-----------+----------+--------------+     Summary: BILATERAL: - No evidence of deep vein thrombosis seen in the lower extremities, bilaterally. -No evidence of popliteal cyst, bilaterally.   *See table(s) above for measurements and observations. Electronically signed by Monica Martinez MD on 06/22/2021 at 7:48:31 PM.    Final    CT CHEST ABDOMEN PELVIS WO CONTRAST  Result Date: 06/25/2021 CLINICAL DATA:  Level 1 trauma. Fall. EXAM: CT CHEST, ABDOMEN AND PELVIS WITHOUT CONTRAST TECHNIQUE: Multidetector CT imaging of the chest, abdomen and pelvis was performed following the standard protocol without IV contrast. COMPARISON:  None similar FINDINGS: CT CHEST FINDINGS Cardiovascular:  Normal heart size. Trace anterior pericardial effusion which appears low-density. Coronary atherosclerosis. Mediastinum/Nodes: Endotracheal and enteric tubes are in unremarkable position. No hematoma or pneumomediastinum. 3.2 cm left thyroid nodule, unchanged from 2020. This was evaluated by ultrasound in 2019. No follow-up recommended unless clinically warranted (ref: J Am Coll Radiol. 2015 Feb;12(2): 143-50). Lungs/Pleura: Bands of scarring in the upper lungs with mild, cylindrical left upper lobe bronchiectasis. Prior left apical surgery with lung sutures. No hemothorax, pneumothorax, or lung contusion. Musculoskeletal: Scoliosis.  No acute fracture or subluxation. CT ABDOMEN PELVIS FINDINGS Hepatobiliary: No hepatic injury or perihepatic hematoma. Gallbladder is unremarkable Pancreas: Unremarkable Spleen: No splenic injury or perisplenic hematoma. Adrenals/Urinary Tract: No adrenal hemorrhage or renal injury identified. Bladder is unremarkable. Bilateral nephrolithiasis. Adjacent or lobulated right lower pole calculus in total measuring 9 mm. 3 mm interpolar left renal calculus. Stomach/Bowel: No evidence of injury. Rectal stool distention. The enteric tube tip is at the stomach. Vascular/Lymphatic: Stranding around the right femoral vessels related to phlebotomy per report. Atheromatous calcification. Reproductive: Hysterectomy Other: No ascites or pneumoperitoneum Musculoskeletal: Scoliosis and ordinary degeneration for age. No acute fracture or subluxation. IMPRESSION: No evidence of acute intrathoracic or intra-abdominal injury. Electronically Signed   By: Jorje Guild M.D.   On: 06/10/2021 11:05   CT Maxillofacial Wo Contrast  Result Date: 07/02/2021 CLINICAL DATA:  Level 1 trauma.  Found down. EXAM: CT HEAD WITHOUT CONTRAST CT MAXILLOFACIAL WITHOUT CONTRAST CT CERVICAL SPINE WITHOUT CONTRAST TECHNIQUE: Multidetector CT imaging of the head, cervical spine, and maxillofacial structures were performed  using the standard protocol without intravenous contrast. Multiplanar CT image reconstructions of the cervical spine and maxillofacial structures were also generated. COMPARISON:  None. FINDINGS: CT HEAD FINDINGS Brain: No evidence of acute infarction, hemorrhage, hydrocephalus, extra-axial collection or mass lesion/mass effect. 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 Vascular: No hyperdense vessel or unexpected calcification. Skull: No acute fracture CT MAXILLOFACIAL FINDINGS Osseous: No fracture or mandibular dislocation. No destructive process. Orbits: No evidence of injury Sinuses: Negative for hemosinus Soft tissues: No opaque foreign body or measurable hematoma. CT CERVICAL SPINE FINDINGS Alignment: No traumatic malalignment. Skull base and vertebrae: No acute fracture. No primary bone lesion or focal pathologic process. Soft tissues and spinal canal: No prevertebral fluid or swelling. No visible canal hematoma. Disc levels: Generalized disc collapse with endplate and facet spurring narrowing foramina diffusely, foraminal narrowing especially along the right-side of a scoliotic curvature. Upper chest: Reported separately IMPRESSION: No evidence of acute intracranial or  cervical spine injury. Negative for facial fracture. Electronically Signed   By: Jorje Guild M.D.   On: 06/08/2021 11:07     PHYSICAL EXAM  Temp:  [97.9 F (36.6 C)-99.5 F (37.5 C)] 97.9 F (36.6 C) (12/17 1115) Pulse Rate:  [72-89] 76 (12/17 1000) Resp:  [16-24] 20 (12/17 1000) BP: (137-203)/(54-76) 170/76 (12/17 1000) SpO2:  [97 %-100 %] 99 % (12/17 1107) FiO2 (%):  [30 %] 30 % (12/17 0800) Weight:  [57.6 kg] 57.6 kg (12/17 0350)  General - Well nourished, well developed elderly lady, intubated off sedation.  Ophthalmologic - fundi not visualized due to noncooperation.  Cardiovascular - Regular rate and rhythm.  Neuro - intubated  off sedation but remains unresponsive, eyes closed, not  following commands. With forced eye opening, eyes in mid position, not blinking to visual threat, doll's eyes absent, not tracking, right pupil 1.78mm but reactive, left pupil pinpoint but irregular shape with evidence of prior ophthalmic surgery. Corneal reflex absent bilaterally, gag and cough present. Breathing over the vent.  Facial symmetry not able to test due to ET tube.  Tongue protrusion not cooperative. On pain stimulation, no significant movement of all extremities. No babinski. Sensation, coordination and gait not tested.   ASSESSMENT/PLAN Julia [name] is a 87 y.o. female with history of cerebral palsy, hypertension, diabetes, sarcoidosis admitted for #home with facial trauma, altered mental status. No tPA given due to unknown last known well.    Stroke:  right MCA and ACA infarcts embolic pattern secondary to unclear source, concerning for occult A. fib CT no acute abnormality MRI right MCA, MCA/PCA and MCA/ACA scattered infarcts CT head and neck unremarkable 2D Echo ejection fraction 65 to 70%. LE venous Doppler no DVT May consider prolonged cardiac monitoring for cardioembolic work-up if aggressive care LDL 25 HgbA1c 8.0 Heparin subcu for VTE prophylaxis aspirin 81 mg daily prior to admission, now on aspirin 325 mg daily given potential procedures (trach or PEG) Ongoing aggressive stroke risk factor management Therapy recommendations: Pending Disposition: Pending, agree with CCM, if pt not make much progress over the weekend, will consider palliative care involvement.   Seizure Reported right facial twitching and right arm jerking movement Received Versed and propofol bolus EEG 12/13 cortical dysfunction arising from right hemisphere, nonspecific etiology  LTM EEG showed focal seizures arising from right frontotemporal region lasting about 2 minute each.  During the seizure patient was noted to have left-sided neck jerking and possible left arm jerking  Keppra load  followed by 500 bid->1500 bid Depakote load x 2, increased to 750 milligrams Q8 Depakote level 33 -> 55 Now off sedation, few brief silent seizures last night if no Sz today , may consider d/c LTM  Fever Leukocytosis  Spiking fever 12/13, T-max 100.9->afebrile Today afebrile WBC 17.8-19.0-14.2-16.7 UA WBC 20-51 CXR unremarkable Blood culture NGTD On cefepime -> zosyn  Diabetes, uncontrolled HgbA1c 8.0 goal < 7.0 Uncontrolled Currently on Levemir 5->10->15 U twice daily CBG monitoring SSI DM education and close PCP follow up  Respiratory failure Intubated on sedation CCM on board Not candidate for extubation today  Hypertension Stable Long term BP goal normotensive  Other Stroke Risk Factors Advanced age  Other Active Problems Cerebral palsy Sarcoidosis  Hospital day # 4 Patient neurological exam remains quite poor but given silent subclinical seizures will increase antiepileptic drugs and continue long-term monitoring for 24 more hours.  Patient does not wake up prognosis will remain poor.  We will need to have goals of  care discussion with family when available.  Discussed with Dr. Tacy Learn critical care medicine.  Discussed with Dr. Hortense Ramal  This patient is critically ill due to right MCA and ACA infarcts, seizure, leukocytosis, respiratory failure, dysphagia and at significant risk of neurological worsening, death form recurrent stroke, status epilepticus, sepsis, heart failure. This patient's care requires constant monitoring of vital signs, hemodynamics, respiratory and cardiac monitoring, review of multiple databases, neurological assessment, discussion with family, other specialists and medical decision making of high complexity. I spent 67minutes of neurocritical care time in the care of this patient.   Antony Contras, MD Stroke Neurology 06/24/2021 12:22 PM    To contact Stroke Continuity provider, please refer to [email or web address] After hours, contact General  Neurology

## 2021-06-24 NOTE — Progress Notes (Signed)
NAME:  Julia [name], MRN:  [id number], DOB:  04/22/1932, LOS: 4 ADMISSION DATE:  06/17/2021, CONSULTATION DATE:  07/03/2021 REFERRING MD:  Dr. Jeraldine Loots, ER, CHIEF COMPLAINT:  AMS   History of Present Illness:  87 yo female found unresponsive at home.  Required intubation for airway protection.  Found to have leukocytosis, lactic acidosis, hyperglycemia, hypernatremia, HTN (BP 229/119).  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  Pertinent  Medical History  GERD, Esophageal stricture, UTI, Rhabdo, CP, Lt eye aphakia, Cataracts, CVA, HTN, DM type 2  Significant Hospital Events: Including procedures, antibiotic start and stop dates in addition to other pertinent events   12/13 Admit, neurology and trauma consulted 12/14 new onset seizure, start LTM  Studies:  CT head 12/13 >> no acute infarction/hemorrhage/hydrocephalus; remote Lt parieto-occipital infarct CT chest 12/13 >> 3.2 cm Lt thyroid nodule (stable since 2020), scarring in upper lungs with cylindrical BTX LUL, prior Lt apical lung surgery, scoliosis EEG 12/13 >> generalized slowing and Rt hemisphere lateralization MRI brain 12/13 >> Restricted diffusion in Rt frontal parietal cortex primarily in Rt MCA territory but also Rt ACA/PCA and Lt ACA/MCA territories; concerning for embolic CVA CT angio head/neck 12/14 >> no large vessel occlusions EEG 12/15 >> focal seizure, Rt frontotemporal region; generalized burst suppression; generalized slowing 12/16 - EEG slow, 2 short seizures in PM, R sided eplileptogenic R hemisphere, comatose 12/17 comatose off sedation > 24 hours  Interim History / Subjective:  NAEON. Mental status poor. Increasing AED per neuro.  Objective   Blood pressure (!) 170/76, pulse 76, temperature 98.5 F (36.9 C), temperature source Oral, resp. rate 20, height 5\' 2"  (1.575 m), weight 57.6 kg, SpO2 98 %.    Vent Mode: PSV;CPAP FiO2 (%):  [30 %] 30 % Set Rate:  [16 bmp] 16 bmp Vt Set:  [400 mL] 400 mL PEEP:  [5 cmH20] 5  cmH20 Pressure Support:  [12 cmH20] 12 cmH20 Plateau Pressure:  [18 cmH20-19 cmH20] 19 cmH20   Intake/Output Summary (Last 24 hours) at 06/24/2021 1102 Last data filed at 06/24/2021 1000 Gross per 24 hour  Intake 1735.88 ml  Output 1300 ml  Net 435.88 ml    Filed Weights   06/21/21 0500 06/22/21 0441 06/24/21 0350  Weight: 48.2 kg 53.4 kg 57.6 kg    Examination:  General - comatose Eyes - PERRL ENT - ETT in place Cardiac - regular rate/rhythm, no murmur Chest - equal breath sounds b/l, no wheezing or rales Abdomen - soft, non tender, + bowel sounds Extremities - lower extremity lymphedema Skin - no rashes Neuro - RASS -4  Resolved Hospital Problem list   Hyperkalemia, Lactic acidosis, Hypokalemia  Assessment & Plan:   Acute metabolic encephalopathy with acute embolic CVA and prior hx of CVA. New onset seizures. - AEDs per neurology - continue ASA -LE doppler negative, Echo largely normal, g1 ddfxn  Compromised airway with acute hypoxic respiratory failure. - pressure support wean as able - mental status barrier to extubation trial at this time - f/u CXR intermittently - goal SpO2 > 92%  HTN emergency. - goal SBP < 180  DM type 2 poorly controlled with hyperglycemia. - SSI with levemir  Recurrent UTI. Aspiration pneumonitis, POA. - day 5/5 of ABx - blood cultures from 12/13 NGTD  Anemia of critical illness. - f/u CBC - transfuse for Hb < 7  Best Practice (right click and "Reselect all SmartList Selections" daily)   Diet/type: tubefeeds DVT prophylaxis: prophylactic heparin  GI prophylaxis: PPI Lines: Central  line Foley:  N/A Code Status:  full code Last date of multidisciplinary goals of care discussion [will attempt to discuss with family 12/17, grave prognosis off sedation and comatose]  Labs    CMP Latest Ref Rng & Units 06/24/2021 06/23/2021 06/22/2021  Glucose 70 - 99 mg/dL 209(H) 305(H) 285(H)  BUN 8 - 23 mg/dL 16 22 16   Creatinine 0.44  - 1.00 mg/dL 0.60 0.90 0.81  Sodium 135 - 145 mmol/L 148(H) 143 146(H)  Potassium 3.5 - 5.1 mmol/L 3.6 3.3(L) 3.7  Chloride 98 - 111 mmol/L 117(H) 115(H) 114(H)  CO2 22 - 32 mmol/L 25 22 22   Calcium 8.9 - 10.3 mg/dL 7.6(L) 7.3(L) 8.2(L)  Total Protein 6.5 - 8.1 g/dL - - -  Total Bilirubin 0.3 - 1.2 mg/dL - - -  Alkaline Phos 38 - 126 U/L - - -  AST 15 - 41 U/L - - -  ALT 0 - 44 U/L - - -    CBC Latest Ref Rng & Units 06/23/2021 06/22/2021 06/21/2021  WBC 4.0 - 10.5 K/uL 16.7(H) 14.2(H) 19.0(H)  Hemoglobin 12.0 - 15.0 g/dL 9.1(L) 9.8(L) 12.3  Hematocrit 36.0 - 46.0 % 29.7(L) 31.8(L) 39.9  Platelets 150 - 400 K/uL 170 176 249    ABG    Component Value Date/Time   PHART 7.451 (H) 06/09/2021 1204   PCO2ART 31.8 (L) 06/17/2021 1204   PO2ART 539 (H) 06/27/2021 1204   HCO3 22.1 06/21/2021 1204   TCO2 23 07/03/2021 1204   ACIDBASEDEF 1.0 06/12/2021 1204   O2SAT 100.0 06/16/2021 1204    CBG (last 3)  Recent Labs    06/24/21 0317 06/24/21 0714 06/24/21 0924  GLUCAP 190* 170* 206*     Critical care time:   CRITICAL CARE Performed by: Bonna Gains Jerime Arif   Total critical care time: 32 minutes  Critical care time was exclusive of separately billable procedures and treating other patients.  Critical care was necessary to treat or prevent imminent or life-threatening deterioration.  Critical care was time spent personally by me on the following activities: development of treatment plan with patient and/or surrogate as well as nursing, discussions with consultants, evaluation of patient's response to treatment, examination of patient, obtaining history from patient or surrogate, ordering and performing treatments and interventions, ordering and review of laboratory studies, ordering and review of radiographic studies, pulse oximetry and re-evaluation of patient's condition.   Lanier Clam, MD [place] [facility]Critical [facility] Se Amion for contact info 06/24/2021, 11:02  AM

## 2021-06-25 LAB — GLUCOSE, CAPILLARY
Glucose-Capillary: 107 mg/dL — ABNORMAL HIGH (ref 70–99)
Glucose-Capillary: 132 mg/dL — ABNORMAL HIGH (ref 70–99)
Glucose-Capillary: 145 mg/dL — ABNORMAL HIGH (ref 70–99)
Glucose-Capillary: 166 mg/dL — ABNORMAL HIGH (ref 70–99)
Glucose-Capillary: 209 mg/dL — ABNORMAL HIGH (ref 70–99)
Glucose-Capillary: 149 mg/dL — ABNORMAL HIGH (ref 70–99)

## 2021-06-25 LAB — BASIC METABOLIC PANEL
Anion gap: 6 (ref 5–15)
BUN: 16 mg/dL (ref 8–23)
CO2: 31 mmol/L (ref 22–32)
Calcium: 8.1 mg/dL — ABNORMAL LOW (ref 8.9–10.3)
Chloride: 113 mmol/L — ABNORMAL HIGH (ref 98–111)
Creatinine, Ser: 0.56 mg/dL (ref 0.44–1.00)
GFR, Estimated: >60 mL/min (ref >60)
Glucose, Bld: 140 mg/dL — ABNORMAL HIGH (ref 70–99)
Potassium: 3.3 mmol/L — ABNORMAL LOW (ref 3.5–5.1)
Sodium: 150 mmol/L — ABNORMAL HIGH (ref 135–145)

## 2021-06-25 LAB — VALPROIC ACID LEVEL: Valproic Acid Lvl: 62 ug/mL (ref 50.0–100.0)

## 2021-06-25 MED ORDER — AMLODIPINE BESYLATE 10 MG PO TABS
10.0000 mg | ORAL_TABLET | Freq: Every day | ORAL | Status: DC
  Administered 2021-06-25 – 2021-06-29 (×5): 10 mg
  Filled 2021-06-25 (×5): qty 1

## 2021-06-25 MED ORDER — VALPROATE SODIUM 100 MG/ML IV SOLN
750.0000 mg | Freq: Four times a day (QID) | INTRAVENOUS | Status: DC
  Administered 2021-06-25 – 2021-06-29 (×16): 750 mg via INTRAVENOUS
  Filled 2021-06-25 (×21): qty 7.5

## 2021-06-25 MED ORDER — LABETALOL HCL 5 MG/ML IV SOLN
10.0000 mg | INTRAVENOUS | Status: DC | PRN
  Administered 2021-06-26 – 2021-06-27 (×4): 10 mg via INTRAVENOUS
  Filled 2021-06-25 (×5): qty 4

## 2021-06-25 MED ORDER — POTASSIUM CHLORIDE 20 MEQ PO PACK
20.0000 meq | PACK | ORAL | Status: AC
  Administered 2021-06-25 (×2): 20 meq
  Filled 2021-06-25: qty 1

## 2021-06-25 MED ORDER — FREE WATER
400.0000 mL | Freq: Four times a day (QID) | Status: DC
Start: 2021-06-25 — End: 2021-06-28
  Administered 2021-06-25 – 2021-06-28 (×12): 400 mL

## 2021-06-25 MED ORDER — POTASSIUM CHLORIDE 10 MEQ/50ML IV SOLN
10.0000 meq | INTRAVENOUS | Status: AC
  Administered 2021-06-25 (×4): 10 meq via INTRAVENOUS
  Filled 2021-06-25 (×4): qty 50

## 2021-06-25 NOTE — Progress Notes (Signed)
[facility] ADULT ICU REPLACEMENT PROTOCOL   The patient does apply for the [facility] Adult ICU Electrolyte Replacment Protocol based on the criteria listed below:   1.Exclusion criteria: TCTS patients, ECMO patients, and Dialysis patients 2. Is GFR >/= 30 ml/min? Yes.    Patient's GFR today is > 60 3. Is SCr </= 2? Yes.   Patient's SCr is 0.56 mg/dL 4. Did SCr increase >/= 0.5 in 24 hours? No. 5.Pt's weight >40kg  Yes.   6. Abnormal electrolyte(s): K= 3.3  7. Electrolytes replaced per protocol 8.  Call MD STAT for K+ </= 2.5, Phos </= 1, or Mag </= 1 Physician:  Dr Lissa Morales, Jettie Booze 06/25/2021 5:23 AM

## 2021-06-25 NOTE — Progress Notes (Signed)
STROKE TEAM PROGRESS NOTE   SUBJECTIVE (INTERVAL HISTORY) No family at the bedside. Pt still intubated, for respiratory failure.  Patient was unable to be weaned today and became tachypneic within 2 hours.  Off sedation, still not responsive, poor brainstem reflexes.  Trace withdrawal in left upper and lower extremity.  LTM EEG continues to show brief electrographic seizures without clinical correlation arising from the right posterior hemisphere lasting 1-2/hour about 45 seconds each.  Severe diffuse encephalopathy.  Depakote level 62 will ncrease maintenance to 750 every 6 hourly.  Agree with CCM, if pt not improving over the weekend, palliative care involvement is reasonable.  I spoke to the patient's son yesterday afternoon about her poor prognosis and he expressed understanding and would like to support her for the next few days before family meeting next week to decide on goals of care.  Neurological exam is unchanged.  Vital signs stable  OBJECTIVE Temp:  [97.4 F (36.3 C)-99 F (37.2 C)] 98 F (36.7 C) (12/18 0729) Pulse Rate:  [62-80] 63 (12/18 1202) Cardiac Rhythm: Normal sinus rhythm (12/18 1204) Resp:  [16-25] 16 (12/18 1202) BP: (126-202)/(54-146) 126/63 (12/18 1202) SpO2:  [94 %-100 %] 99 % (12/18 1202) FiO2 (%):  [30 %] 30 % (12/18 1112) Weight:  [55.7 kg] 55.7 kg (12/18 0406)  Recent Labs  Lab 06/24/21 1918 06/24/21 2309 06/25/21 0303 06/25/21 0728 06/25/21 1127  GLUCAP 153* 185* 107* 132* 145*   Recent Labs  Lab 06/21/21 0347 06/22/21 0437 06/23/21 0412 06/24/21 0250 06/25/21 0410  NA 147* 146* 143 148* 150*  K 4.3 3.7 3.3* 3.6 3.3*  CL 117* 114* 115* 117* 113*  CO2 23 22 22 25 31   GLUCOSE 153* 285* 305* 209* 140*  BUN 18 16 22 16 16   CREATININE 0.94 0.81 0.90 0.60 0.56  CALCIUM 9.3 8.2* 7.3* 7.6* 8.1*  MG 1.9 1.5*  --   --   --   PHOS 1.7* 3.9  --   --   --    Recent Labs  Lab 07/05/2021 1126  AST 25  ALT 13  ALKPHOS 95  BILITOT 1.1  PROT 6.6   ALBUMIN 3.1*   Recent Labs  Lab 07/08/2021 1017 06/22/2021 1034 06/19/2021 1204 06/21/21 0347 06/22/21 0437 06/23/21 0412  WBC 17.8*  --   --  19.0* 14.2* 16.7*  HGB 14.4 16.7* 15.0 12.3 9.8* 9.1*  HCT 47.2* 49.0* 44.0 39.9 31.8* 29.7*  MCV 75.0*  --   --  75.1* 75.0* 74.6*  PLT 290  --   --  249 176 170   No results for input(s): CKTOTAL, CKMB, CKMBINDEX, TROPONINI in the last 168 hours. No results for input(s): LABPROT, INR in the last 72 hours.  No results for input(s): COLORURINE, LABSPEC, [place], GLUCOSEU, HGBUR, BILIRUBINUR, KETONESUR, PROTEINUR, UROBILINOGEN, NITRITE, LEUKOCYTESUR in the last 72 hours.  Invalid input(s): APPERANCEUR      Component Value Date/Time   CHOL 94 06/21/2021 1114   TRIG 26 06/24/2021 0250   HDL 61 06/21/2021 1114   CHOLHDL 1.5 06/21/2021 1114   VLDL 8 06/21/2021 1114   LDLCALC 25 06/21/2021 1114   Lab Results  Component Value Date   HGBA1C 8.0 (H) 06/22/2021   No results found for: LABOPIA, [place], [place], [place], [place], [place]  Recent Labs  Lab 06/15/2021 1126  [place] <10    I have personally reviewed the radiological images below and agree with the radiology interpretations.  CT ANGIO HEAD NECK W WO CM  Result Date:  06/21/2021 CLINICAL DATA:  Stroke/TIA, determine embolic source EXAM: CT ANGIOGRAPHY HEAD AND NECK TECHNIQUE: Multidetector CT imaging of the head and neck was performed using the standard protocol during bolus administration of intravenous contrast. Multiplanar CT image reconstructions and MIPs were obtained to evaluate the vascular anatomy. Carotid stenosis measurements (when applicable) are obtained utilizing NASCET criteria, using the distal internal carotid diameter as the denominator. CONTRAST:  39mL OMNIPAQUE IOHEXOL 350 MG/ML SOLN COMPARISON:  CT and MRI brain 07/04/2021; MRA head 2015 FINDINGS: CT HEAD Brain: There is no acute intracranial hemorrhage. There is new loss of gray-white differentiation corresponding  to some of the areas of infarction seen on the MRI, particularly in the right hemisphere. There is no mass effect. No hydrocephalus. Stable chronic infarcts and chronic microvascular ischemic changes. Vascular: No new finding. Skull: Calvarium is unremarkable. Sinuses/Orbits: No acute finding. Other: None. Review of the MIP images confirms the above findings CTA NECK Aortic arch: Mild calcified plaque along the arch. Great vessel origins appear. Right carotid system: Patent.  No stenosis. Left carotid system: Patent. Plaque at the ICA origin causes less than 50% stenosis. Vertebral arteries: Patent.  Codominant.  No stenosis. Skeleton: Degenerative changes of the included spine. Other neck: Enlarged, heterogeneous thyroid previously evaluated by ultrasound. Endotracheal and enteric tubes are present Upper chest: Scarring with traction bronchiectasis. Review of the MIP images confirms the above findings CTA HEAD Anterior circulation: Intracranial internal carotid arteries are patent with calcified plaque causing mild stenosis. Anterior and middle cerebral arteries are patent. Atherosclerotic irregularity of distal branches. Posterior circulation: Intracranial vertebral arteries are patent. Basilar artery is patent. Major cerebellar artery origins are patent. Right posterior communicating artery is present. Posterior cerebral arteries are patent. Venous sinuses: Patent as allowed by contrast bolus timing. Review of the MIP images confirms the above findings IMPRESSION: No large vessel occlusion or hemodynamically significant stenosis in the neck. No proximal intracranial vessel occlusion. Atherosclerotic irregularity of medium to small vessel branches in the anterior circulation. Electronically Signed   By: Macy Mis M.D.   On: 06/21/2021 14:48   DG Abd 1 View  Result Date: 06/21/2021 CLINICAL DATA:  NG tube placement. EXAM: ABDOMEN - 1 VIEW COMPARISON:  CT scan 06/30/2021 FINDINGS: The bowel gas pattern is  unremarkable. The NG tube tip is in the antropyloric region of the stomach. IMPRESSION: NG tube tip is in the antropyloric region of the stomach. Electronically Signed   By: Marijo Sanes M.D.   On: 06/21/2021 13:37   CT Head Wo Contrast  Result Date: 06/19/2021 CLINICAL DATA:  Level 1 trauma.  Found down. EXAM: CT HEAD WITHOUT CONTRAST CT MAXILLOFACIAL WITHOUT CONTRAST CT CERVICAL SPINE WITHOUT CONTRAST TECHNIQUE: Multidetector CT imaging of the head, cervical spine, and maxillofacial structures were performed using the standard protocol without intravenous contrast. Multiplanar CT image reconstructions of the cervical spine and maxillofacial structures were also generated. COMPARISON:  None. FINDINGS: CT HEAD FINDINGS Brain: No evidence of acute infarction, hemorrhage, hydrocephalus, extra-axial collection or mass lesion/mass effect. 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 Vascular: No hyperdense vessel or unexpected calcification. Skull: No acute fracture CT MAXILLOFACIAL FINDINGS Osseous: No fracture or mandibular dislocation. No destructive process. Orbits: No evidence of injury Sinuses: Negative for hemosinus Soft tissues: No opaque foreign body or measurable hematoma. CT CERVICAL SPINE FINDINGS Alignment: No traumatic malalignment. Skull base and vertebrae: No acute fracture. No primary bone lesion or focal pathologic process. Soft tissues and spinal canal: No prevertebral fluid or swelling. No visible canal  hematoma. Disc levels: Generalized disc collapse with endplate and facet spurring narrowing foramina diffusely, foraminal narrowing especially along the right-side of a scoliotic curvature. Upper chest: Reported separately IMPRESSION: No evidence of acute intracranial or cervical spine injury. Negative for facial fracture. Electronically Signed   By: Jorje Guild M.D.   On: 06/13/2021 11:07   CT Cervical Spine Wo Contrast  Result Date: 06/26/2021 CLINICAL DATA:  Level 1  trauma.  Found down. EXAM: CT HEAD WITHOUT CONTRAST CT MAXILLOFACIAL WITHOUT CONTRAST CT CERVICAL SPINE WITHOUT CONTRAST TECHNIQUE: Multidetector CT imaging of the head, cervical spine, and maxillofacial structures were performed using the standard protocol without intravenous contrast. Multiplanar CT image reconstructions of the cervical spine and maxillofacial structures were also generated. COMPARISON:  None. FINDINGS: CT HEAD FINDINGS Brain: No evidence of acute infarction, hemorrhage, hydrocephalus, extra-axial collection or mass lesion/mass effect. 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 Vascular: No hyperdense vessel or unexpected calcification. Skull: No acute fracture CT MAXILLOFACIAL FINDINGS Osseous: No fracture or mandibular dislocation. No destructive process. Orbits: No evidence of injury Sinuses: Negative for hemosinus Soft tissues: No opaque foreign body or measurable hematoma. CT CERVICAL SPINE FINDINGS Alignment: No traumatic malalignment. Skull base and vertebrae: No acute fracture. No primary bone lesion or focal pathologic process. Soft tissues and spinal canal: No prevertebral fluid or swelling. No visible canal hematoma. Disc levels: Generalized disc collapse with endplate and facet spurring narrowing foramina diffusely, foraminal narrowing especially along the right-side of a scoliotic curvature. Upper chest: Reported separately IMPRESSION: No evidence of acute intracranial or cervical spine injury. Negative for facial fracture. Electronically Signed   By: Jorje Guild M.D.   On: 06/12/2021 11:07   MR BRAIN WO CONTRAST  Result Date: 06/26/2021 CLINICAL DATA:  Neuro deficit, stroke suspected EXAM: MRI HEAD WITHOUT CONTRAST TECHNIQUE: Multiplanar, multiecho pulse sequences of the brain and surrounding structures were obtained without intravenous contrast. COMPARISON:  05/17/2019. FINDINGS: Brain: Areas of cortical restricted diffusion with ADC correlate in the  right MCA territory (series 5, image 95, for example), with additional smaller areas of cortical restricted diffusion in the right PCA territory (series 5, image 76-7) and right ACA territory (series 5, image 97-99), with additional more punctate right MCA white matter infarcts (series 5, image 86 for example), and with additional scattered left frontal infarcts (series 5, images 88 and 92). No significant gyral swelling. No acute hemorrhage, mass, mass effect, or midline shift. Confluent T2 hyperintense signal in the periventricular white matter and pons, likely the sequela of severe chronic small vessel ischemic disease. Remote left parietal infarct. Vascular: Normal flow voids. Skull and upper cervical spine: Choose 1 Sinuses/Orbits: Mucosal thickening in the right frontal sinus, right ethmoid air cells, right sphenoid sinus, and right maxillary sinus. Status post bilateral lens replacements. Other: None. IMPRESSION: Restricted diffusion in the right frontal parietal cortex, primarily in the right MCA territory, but also to a lesser extent in the right ACA, right PCA, and left ACA and MCA territory. This is overall concerning for an embolic etiology. No evidence of hemorrhagic conversion, mass effect, or midline shift. These results will be called to the ordering clinician or representative by the Radiologist Assistant, and communication documented in the PACS or [employer]. Electronically Signed   By: Merilyn Baba M.D.   On: 06/10/2021 23:39   DG Pelvis Portable  Result Date: 06/22/2021 CLINICAL DATA:  Trauma EXAM: PORTABLE PELVIS 1-2 VIEWS COMPARISON:  None. FINDINGS: Patient is rotated to the left which somewhat evaluation. There is  no evidence of pelvic fracture or diastasis. No pelvic bone lesions are seen. Limited evaluation of the sacrum due to overlying bowel gas. IMPRESSION: No acute osseous abnormality. Electronically Signed   By: Yetta Glassman M.D.   On: 06/25/2021 10:46   DG Chest  Port 1 View  Result Date: 06/23/2021 CLINICAL DATA:  Respiratory failure EXAM: PORTABLE CHEST 1 VIEW COMPARISON:  Chest radiograph 06/21/2021 FINDINGS: The endotracheal tube tip is approximately 3.4 cm from the carina. The left sided vascular catheter terminates at the cavoatrial junction. The enteric catheter tip projects over the region of the pylorus. The cardiomediastinal silhouette is stable. There are likely small bilateral pleural effusions with adjacent subsegmental atelectasis. Otherwise, there is no new focal consolidation. There is no pulmonary edema. There is no appreciable pneumothorax. Chain sutures in the left upper lobe are unchanged. IMPRESSION: Small bilateral pleural effusions with adjacent atelectasis, increased in conspicuity since 06/21/2021. Electronically Signed   By: Valetta Mole M.D.   On: 06/23/2021 09:22   DG CHEST PORT 1 VIEW  Result Date: 06/21/2021 CLINICAL DATA:  Shortness of breath EXAM: PORTABLE CHEST 1 VIEW COMPARISON:  Previous studies including the examination of 07/03/2021 FINDINGS: Apparent shift of mediastinum to the left may be due to rotation. Transverse diameter of heart is within normal limits. Thoracic aorta is tortuous and ectatic. Tip of endotracheal tube is 4.1 cm above the carina. Tip of left jugular central venous catheter is seen at the junction of superior vena cava and right atrium. Tip of enteric tube is seen in the stomach. Side-port in the enteric tube is in the lower thoracic esophagus close to gastroesophageal junction. Linear densities in the right upper lung fields may suggest scarring or subsegmental atelectasis. There is no new focal pulmonary consolidation. There is no pleural effusion or pneumothorax. IMPRESSION: There are no new infiltrates or signs of pulmonary edema. Side-port in enteric tube is seen in the lower thoracic esophagus close to gastroesophageal junction. Enteric tube could be advanced 5-10 cm to place the side port within the  stomach. Electronically Signed   By: Elmer Picker M.D.   On: 06/21/2021 08:08   DG Chest Portable 1 View  Result Date: 06/27/2021 CLINICAL DATA:  Central line placement EXAM: PORTABLE CHEST 1 VIEW COMPARISON:  Previous studies including examination done earlier today. FINDINGS: Cardiac size is within normal limits. Thoracic aorta is tortuous and ectatic. Tip of endotracheal tube is approximately 3.6 cm above the carina. Enteric tube is noted traversing the esophagus with its side port at the gastroesophageal junction. There is interval placement of left internal jugular central venous catheter with its tip in the region of junction of superior vena cava and right atrium. Linear density is seen in the right upper lung fields and left mid lung fields suggesting with no significant interval change suggesting scarring or subsegmental atelectasis. There is no new focal consolidation. There are no signs of alveolar pulmonary edema. IMPRESSION: There are no new infiltrates or signs of pulmonary edema. Linear densities in the right upper lobe and left mid lung fields may suggest scarring or subsegmental atelectasis. Other findings as described in the body of the report. Electronically Signed   By: Elmer Picker M.D.   On: 07/07/2021 12:59   DG Chest Port 1 View  Result Date: 06/22/2021 CLINICAL DATA:  Trauma EXAM: PORTABLE CHEST 1 VIEW COMPARISON:  Chest x-ray dated November 02, 2020 FINDINGS: ET tube tip projects approximately 1.0 cm from the carina. Enteric tube partially seen coursing below  diaphragm. Evaluation is limited due to left port patient rotation. Bilateral upper lung linear opacities are unchanged compared to prior exam likely due to scarring or atelectasis. No focal consolidation. No large pleural effusion or evidence of pneumothorax. Limited evaluation of cardiac and mediastinal contours due to patient rotation. IMPRESSION: No acute cardiopulmonary no acute cardiopulmonary abnormality,  evaluation somewhat limited due to patient rotation. Electronically Signed   By: Yetta Glassman M.D.   On: 07/07/2021 10:44   EEG adult  Result Date: 06/23/2021 Lora Havens, MD     06/16/2021  2:29 PM Patient Name: GENELDA GIAMBRA MRN: [id number] Epilepsy Attending: Lora Havens Referring Physician/Provider: Dr Ina Homes Date: 07/07/2021 Duration: 24.16 mins Patient history: 87yo F with ams. EEG to evaluate for seizure Level of alertness: comatose AEDs during EEG study: Propofol Technical aspects: This EEG study was done with scalp electrodes positioned according to the 10-20 International system of electrode placement. Electrical activity was acquired at a sampling rate of 500Hz  and reviewed with a high frequency filter of 70Hz  and a low frequency filter of 1Hz . EEG data were recorded continuously and digitally stored. Description: EEG showed continuous low amplitude 2-3hz  delta slowing in right hemisphere. There was also predominantly 5-8Hz  theta-alpha activity in left hemisphere admixed with intermittent 2-3hz  delta slowing. Hyperventilation and photic stimulation were not performed.   ABNORMALITY - Continuous slow, generalized and lateralized right hemisphere IMPRESSION: This study is suggestive of cortical dysfunction arising from right hemisphere, nonspecific etiology but likely secondary to underlying structural abnormality, post-ictal state. Additionally there is moderate diffuse encephalopathy, nonspecific etiology but likely could be secondary to sedation. No seizures or epileptiform discharges were seen throughout the recording. Dr Rory Percy was notified. Priyanka Barbra Sarks   Overnight EEG with video  Result Date: 06/22/2021 Lora Havens, MD     06/23/2021  9:24 AM Patient Name: JASMEEN GEISS MRN: [id number] Epilepsy Attending: Lora Havens Referring Physician/Provider: Dr Rosalin Hawking Duration: 06/21/2021 1445 to 06/22/2021 1445  Patient history: 87yo F with ams. EEG to evaluate  for seizure  Level of alertness: comatose  AEDs during EEG study: Propofol, LEV,VPA  Technical aspects: This EEG study was done with scalp electrodes positioned according to the 10-20 International system of electrode placement. Electrical activity was acquired at a sampling rate of 500Hz  and reviewed with a high frequency filter of 70Hz  and a low frequency filter of 1Hz . EEG data were recorded continuously and digitally stored.  Description: EEG initially showed continuous low amplitude 2-3hz  delta slowing in right hemisphere. There was also predominantly 5-8Hz  theta-alpha activity in left hemisphere admixed with intermittent 2-3hz  delta slowing.  After around 2330 on 06/21/2021, as medications were adjusted, EEG was suggestive of burst attenuation pattern with bursts of low amplitude polymorphic asynchronous 2 to 3 Hz delta slowing with overriding 15 to 18 Hz beta activity lasting 2 to 3 seconds alternating with 5 to 8 seconds of generalized EEG attenuation. Patient event button was pressed on 06/22/2021 at 2001, 2016, 2216, 2038, 2253, 2309 and 2315 for left side neck jerking and possible left arm jerking ( covered under blanket).  Concomitant EEG initially showed sharply contoured 6 to 8 Hz theta-alpha activity in the right frontal temporal region which gradually evolved into 2 to 3 Hz delta slowing. Hyperventilation and photic stimulation were not performed.    ABNORMALITY - Focal seizure, right frontotemporal region  - Continuous slow, generalized and lateralized right hemisphere - Burst attenuation, generalized  IMPRESSION: This study showed focal seizures  arising from right frontotemporal region on 06/22/2021 at 2001, 2016, 2216, 2038, 2253, 2309 and 2315, lasting about 2 minute each.  During the seizure patient was noted to have left-sided neck jerking and possible left arm jerking ( covered under blanket). EEG was also suggestive of cortical dysfunction arising from right hemisphere, nonspecific etiology  but likely secondary to underlying structural abnormality, post-ictal state.  Additionally EEG was initially suggestive of moderate diffuse encephalopathy.  After around 2330 on 06/21/2021, as medications were adjusted, EEG was suggestive of profound diffuse encephalopathy, nonspecific etiology but likely could be secondary to sedation.  Charlsie Quest   ECHOCARDIOGRAM COMPLETE  Result Date: 06/23/2021    ECHOCARDIOGRAM REPORT   Patient Name:   REANNA SCOGGIN [facility] Date of Exam: 06/23/2021 Medical Rec #:  [id number]     Height:       62.0 in Accession #:    [id number]    Weight:       117.7 lb Date of Birth:  02-Sep-1931     BSA:          1.526 m Patient Age:    87 years      BP:           149/58 mmHg Patient Gender: F             HR:           80 bpm. Exam Location:  Inpatient Procedure: 2D Echo, 3D Echo, Cardiac Doppler, Color Doppler and Strain Analysis Indications:    Stroke I63.9  History:        Patient has no prior history of Echocardiogram examinations.                 Signs/Symptoms:Fever; Risk Factors:Diabetes and Hypertension.  Sonographer:    Leta Jungling RDCS Referring Phys: [id number] [facility] XU  Sonographer Comments: Echo performed with patient supine and on artificial respirator. IMPRESSIONS  1. Left ventricular ejection fraction, by estimation, is 65 to 70%. The left ventricle has normal function. The left ventricle has no regional wall motion abnormalities. There is mild left ventricular hypertrophy of the basal segment. Left ventricular diastolic parameters are consistent with Grade I diastolic dysfunction (impaired relaxation).  2. Right ventricular systolic function is normal. The right ventricular size is normal. Tricuspid regurgitation signal is inadequate for assessing PA pressure.  3. The mitral valve is grossly normal. No evidence of mitral valve regurgitation.  4. The aortic valve is grossly normal. Aortic valve regurgitation is not visualized.  5. The inferior vena cava is normal in size  with greater than 50% respiratory variability, suggesting right atrial pressure of 3 mmHg. Comparison(s): No prior Echocardiogram. Conclusion(s)/Recommendation(s): Normal biventricular function without evidence of hemodynamically significant valvular heart disease. FINDINGS  Left Ventricle: Left ventricular ejection fraction, by estimation, is 65 to 70%. The left ventricle has normal function. The left ventricle has no regional wall motion abnormalities. Global longitudinal strain performed but not reported based on interpreter judgement due to suboptimal tracking. The left ventricular internal cavity size was normal in size. There is mild left ventricular hypertrophy of the basal segment. Left ventricular diastolic parameters are consistent with Grade I diastolic dysfunction (impaired relaxation). Right Ventricle: The right ventricular size is normal. No increase in right ventricular wall thickness. Right ventricular systolic function is normal. Tricuspid regurgitation signal is inadequate for assessing PA pressure. Left Atrium: Left atrial size was normal in size. Right Atrium: Right atrial size was normal in size. Pericardium: There is no evidence of pericardial  effusion. Mitral Valve: The mitral valve is grossly normal. No evidence of mitral valve regurgitation. Tricuspid Valve: The tricuspid valve is grossly normal. Tricuspid valve regurgitation is not demonstrated. Aortic Valve: Focal calcification. The aortic valve is grossly normal. Aortic valve regurgitation is not visualized. Pulmonic Valve: The pulmonic valve was grossly normal. Pulmonic valve regurgitation is not visualized. Aorta: The aortic root and ascending aorta are structurally normal, with no evidence of dilitation. Venous: The inferior vena cava is normal in size with greater than 50% respiratory variability, suggesting right atrial pressure of 3 mmHg. IAS/Shunts: No atrial level shunt detected by color flow Doppler.  LEFT VENTRICLE PLAX 2D  LVIDd:         3.60 cm   Diastology LVIDs:         2.00 cm   LV e' medial:    5.88 cm/s LV PW:         1.00 cm   LV E/e' medial:  16.1 LV IVS:        1.50 cm   LV e' lateral:   6.20 cm/s LVOT diam:     1.70 cm   LV E/e' lateral: 15.3 LV SV:         57 LV SV Index:   37 LVOT Area:     2.27 cm                           3D Volume EF:                          3D EF:        71 %                          LV EDV:       68 ml                          LV ESV:       20 ml                          LV SV:        49 ml RIGHT VENTRICLE RV S prime:     11.60 cm/s TAPSE (M-mode): 2.4 cm LEFT ATRIUM             Index        RIGHT ATRIUM          Index LA diam:        3.10 cm 2.03 cm/m   RA Area:     6.94 cm LA Vol (A2C):   20.5 ml 13.43 ml/m  RA Volume:   11.80 ml 7.73 ml/m LA Vol (A4C):   36.5 ml 23.92 ml/m LA Biplane Vol: 27.7 ml 18.15 ml/m  AORTIC VALVE LVOT Vmax:   115.00 cm/s LVOT Vmean:  82.800 cm/s LVOT VTI:    0.252 m  AORTA Ao Root diam: 2.80 cm Ao Asc diam:  3.00 cm MITRAL VALVE MV Area (PHT): 3.65 cm     SHUNTS MV Decel Time: 208 msec     Systemic VTI:  0.25 m MV E velocity: 94.70 cm/s   Systemic Diam: 1.70 cm MV A velocity: 110.00 cm/s MV E/A ratio:  0.86 Mary Scientist, physiological signed by Phineas Inches Signature Date/Time: 06/23/2021/2:28:46 PM    Final  VAS [place] LOWER EXTREMITY VENOUS (DVT)  Result Date: 06/22/2021  Lower Venous DVT Study Patient Name:  WYLODEAN LOZEAU [facility]  Date of Exam:   06/22/2021 Medical Rec #: [id number]      Accession #:    [id number] Date of Birth: July 11, 1931      Patient Gender: F Patient Age:   55 years Exam Location:  [facility] Procedure:      VAS [place] LOWER EXTREMITY VENOUS (DVT) Referring Phys: Cornelius Moras XU --------------------------------------------------------------------------------  Indications: Stroke.  Comparison Study: no prior Performing Technologist: Archie Patten RVS  Examination Guidelines: A complete evaluation includes B-mode imaging, spectral Doppler, color  Doppler, and power Doppler as needed of all accessible portions of each vessel. Bilateral testing is considered an integral part of a complete examination. Limited examinations for reoccurring indications may be performed as noted. The reflux portion of the exam is performed with the patient in reverse Trendelenburg.  +---------+---------------+---------+-----------+----------+--------------+  RIGHT     Compressibility Phasicity Spontaneity Properties Thrombus Aging  +---------+---------------+---------+-----------+----------+--------------+  CFV       Full            Yes       Yes                                    +---------+---------------+---------+-----------+----------+--------------+  SFJ       Full                                                             +---------+---------------+---------+-----------+----------+--------------+  FV Prox   Full                                                             +---------+---------------+---------+-----------+----------+--------------+  FV Mid    Full            Yes       Yes                                    +---------+---------------+---------+-----------+----------+--------------+  FV Distal Full            Yes       Yes                                    +---------+---------------+---------+-----------+----------+--------------+  PFV       Full                                                             +---------+---------------+---------+-----------+----------+--------------+  POP       Full            Yes       Yes                                    +---------+---------------+---------+-----------+----------+--------------+  PTV       Full                                                             +---------+---------------+---------+-----------+----------+--------------+  PERO      Full                                                             +---------+---------------+---------+-----------+----------+--------------+    +---------+---------------+---------+-----------+----------+--------------+  LEFT      Compressibility Phasicity Spontaneity Properties Thrombus Aging  +---------+---------------+---------+-----------+----------+--------------+  CFV       Full            Yes       Yes                                    +---------+---------------+---------+-----------+----------+--------------+  SFJ       Full                                                             +---------+---------------+---------+-----------+----------+--------------+  FV Prox   Full                                                             +---------+---------------+---------+-----------+----------+--------------+  FV Mid    Full                                                             +---------+---------------+---------+-----------+----------+--------------+  FV Distal                 Yes       Yes                                    +---------+---------------+---------+-----------+----------+--------------+  PFV       Full                                                             +---------+---------------+---------+-----------+----------+--------------+  POP       Full            Yes       Yes                                    +---------+---------------+---------+-----------+----------+--------------+  PTV       Full                                                             +---------+---------------+---------+-----------+----------+--------------+  PERO      Full                                                             +---------+---------------+---------+-----------+----------+--------------+     Summary: BILATERAL: - No evidence of deep vein thrombosis seen in the lower extremities, bilaterally. -No evidence of popliteal cyst, bilaterally.   *See table(s) above for measurements and observations. Electronically signed by Monica Martinez MD on 06/22/2021 at 7:48:31 PM.    Final    CT CHEST ABDOMEN PELVIS WO CONTRAST  Result Date:  06/28/2021 CLINICAL DATA:  Level 1 trauma. Fall. EXAM: CT CHEST, ABDOMEN AND PELVIS WITHOUT CONTRAST TECHNIQUE: Multidetector CT imaging of the chest, abdomen and pelvis was performed following the standard protocol without IV contrast. COMPARISON:  None similar FINDINGS: CT CHEST FINDINGS Cardiovascular: Normal heart size. Trace anterior pericardial effusion which appears low-density. Coronary atherosclerosis. Mediastinum/Nodes: Endotracheal and enteric tubes are in unremarkable position. No hematoma or pneumomediastinum. 3.2 cm left thyroid nodule, unchanged from 2020. This was evaluated by ultrasound in 2019. No follow-up recommended unless clinically warranted (ref: J Am Coll Radiol. 2015 Feb;12(2): 143-50). Lungs/Pleura: Bands of scarring in the upper lungs with mild, cylindrical left upper lobe bronchiectasis. Prior left apical surgery with lung sutures. No hemothorax, pneumothorax, or lung contusion. Musculoskeletal: Scoliosis.  No acute fracture or subluxation. CT ABDOMEN PELVIS FINDINGS Hepatobiliary: No hepatic injury or perihepatic hematoma. Gallbladder is unremarkable Pancreas: Unremarkable Spleen: No splenic injury or perisplenic hematoma. Adrenals/Urinary Tract: No adrenal hemorrhage or renal injury identified. Bladder is unremarkable. Bilateral nephrolithiasis. Adjacent or lobulated right lower pole calculus in total measuring 9 mm. 3 mm interpolar left renal calculus. Stomach/Bowel: No evidence of injury. Rectal stool distention. The enteric tube tip is at the stomach. Vascular/Lymphatic: Stranding around the right femoral vessels related to phlebotomy per report. Atheromatous calcification. Reproductive: Hysterectomy Other: No ascites or pneumoperitoneum Musculoskeletal: Scoliosis and ordinary degeneration for age. No acute fracture or subluxation. IMPRESSION: No evidence of acute intrathoracic or intra-abdominal injury. Electronically Signed   By: Jorje Guild M.D.   On: 06/13/2021 11:05   CT  Maxillofacial Wo Contrast  Result Date: 06/10/2021 CLINICAL DATA:  Level 1 trauma.  Found down. EXAM: CT HEAD WITHOUT CONTRAST CT MAXILLOFACIAL WITHOUT CONTRAST CT CERVICAL SPINE WITHOUT CONTRAST TECHNIQUE: Multidetector CT imaging of the head, cervical spine, and maxillofacial structures were performed using the standard protocol without intravenous contrast. Multiplanar CT image reconstructions of the cervical spine and maxillofacial structures were also generated. COMPARISON:  None. FINDINGS: CT HEAD FINDINGS Brain: No evidence of acute infarction, hemorrhage, hydrocephalus, extra-axial collection or mass lesion/mass effect. 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 Vascular: No hyperdense vessel or unexpected calcification. Skull: No acute fracture CT MAXILLOFACIAL FINDINGS Osseous: No fracture or mandibular dislocation. No destructive process. Orbits: No evidence of injury Sinuses: Negative for hemosinus Soft tissues: No opaque foreign body or measurable hematoma. CT  CERVICAL SPINE FINDINGS Alignment: No traumatic malalignment. Skull base and vertebrae: No acute fracture. No primary bone lesion or focal pathologic process. Soft tissues and spinal canal: No prevertebral fluid or swelling. No visible canal hematoma. Disc levels: Generalized disc collapse with endplate and facet spurring narrowing foramina diffusely, foraminal narrowing especially along the right-side of a scoliotic curvature. Upper chest: Reported separately IMPRESSION: No evidence of acute intracranial or cervical spine injury. Negative for facial fracture. Electronically Signed   By: Jorje Guild M.D.   On: 06/19/2021 11:07     PHYSICAL EXAM  Temp:  [97.4 F (36.3 C)-99 F (37.2 C)] 98 F (36.7 C) (12/18 0729) Pulse Rate:  [62-80] 63 (12/18 1202) Resp:  [16-25] 16 (12/18 1202) BP: (126-202)/(54-146) 126/63 (12/18 1202) SpO2:  [94 %-100 %] 99 % (12/18 1202) FiO2 (%):  [30 %] 30 % (12/18 1112) Weight:   [55.7 kg] 55.7 kg (12/18 0406)  General - Well nourished, well developed elderly lady, intubated off sedation.  Ophthalmologic - fundi not visualized due to noncooperation.  Cardiovascular - Regular rate and rhythm.  Neuro - intubated  off sedation but remains unresponsive, eyes closed, not following commands. With forced eye opening, eyes in mid position, not blinking to visual threat, doll's eyes absent, not tracking, right pupil 1.64mm but reactive, left pupil pinpoint but irregular shape with evidence of prior ophthalmic surgery. Corneal reflex absent bilaterally, gag and cough present. Breathing over the vent.  Facial symmetry not able to test due to ET tube.  Tongue protrusion not cooperative. On pain stimulation, no significant movement of all extremities except trace withdrawal in the left greater than right lower extremity.. No babinski. Sensation, coordination and gait not tested.   ASSESSMENT/PLAN Ms. KAMBREA SCHEERER is a 87 y.o. female with history of cerebral palsy, hypertension, diabetes, sarcoidosis admitted for #home with facial trauma, altered mental status. No tPA given due to unknown last known well.    Stroke:  right MCA and ACA infarcts embolic pattern secondary to unclear source, concerning for occult A. fib CT no acute abnormality MRI right MCA, MCA/PCA and MCA/ACA scattered infarcts CT head and neck unremarkable 2D Echo ejection fraction 65 to 70%. LE venous Doppler no DVT LDL 25 HgbA1c 8.0 Heparin subcu for VTE prophylaxis aspirin 81 mg daily prior to admission, now on aspirin 325 mg daily given potential procedures (trach or PEG) Ongoing aggressive stroke risk factor management Therapy recommendations: Pending Disposition: Pending, agree with CCM, if pt not make much progress over the weekend, will consider palliative care involvement.   Seizure Reported right facial twitching and right arm jerking movement Received Versed and propofol bolus EEG 12/13 cortical  dysfunction arising from right hemisphere, nonspecific etiology  LTM EEG showed focal seizures arising from right frontotemporal region lasting about 2 minute each.  During the seizure patient was noted to have left-sided neck jerking and possible left arm jerking  Keppra load followed by 500 bid->1500 bid Depakote load x 2, increased to 750 milligrams Q6 Depakote level 33 -> 55. >62 Now off sedation, few brief silent seizures continue on long-term EEG monitoring Fever Leukocytosis  Spiking fever 12/13, T-max 100.9->afebrile Today afebrile WBC 17.8-19.0-14.2-16.7 UA WBC 20-51 CXR unremarkable Blood culture NGTD On cefepime -> zosyn  Diabetes, uncontrolled HgbA1c 8.0 goal < 7.0 Uncontrolled Currently on Levemir 5->10->15 U twice daily CBG monitoring SSI DM education and close PCP follow up  Respiratory failure Intubated on sedation CCM on board Not candidate for extubation today  Hypertension Stable Long  term BP goal normotensive  Other Stroke Risk Factors Advanced age  Other Active Problems Cerebral palsy Sarcoidosis  Hospital day # 5 Patient neurological exam continues to remain quite poor but given silent subclinical seizures will increase valproic acid to 750 mg every 6 hourly and continue long-term monitoring for 24 more hours.  Patient does not wake up prognosis will remain poor.  We will need to have goals of care discussion with family when available.  Discussed with Dr. Tacy Learn critical care medicine.  Discussed with patient's son yesterday afternoon and answered questions. This patient is critically ill due to right MCA and ACA infarcts, seizure, leukocytosis, respiratory failure, dysphagia and at significant risk of neurological worsening, death form recurrent stroke, status epilepticus, sepsis, heart failure. This patient's care requires constant monitoring of vital signs, hemodynamics, respiratory and cardiac monitoring, review of multiple databases, neurological  assessment, discussion with family, other specialists and medical decision making of high complexity. I spent 32 minutes of neurocritical care time in the care of this patient.   Antony Contras, MD Stroke Neurology 06/25/2021 1:13 PM    To contact Stroke Continuity provider, please refer to [email or web address] After hours, contact General Neurology

## 2021-06-25 NOTE — Procedures (Addendum)
Patient Name: Julia [name]  MRN: [id number]  Epilepsy Attending: Charlsie Quest  Referring Physician/Provider: Dr Marvel Plan Duration: 06/24/2021 1445 to 06/25/2021 1445   Patient history: 87yo F with ams. EEG to evaluate for seizure   Level of alertness: comatose   AEDs during EEG study: LEV,VPA   Technical aspects: This EEG study was done with scalp electrodes positioned according to the 10-20 International system of electrode placement. Electrical activity was acquired at a sampling rate of 500Hz  and reviewed with a high frequency filter of 70Hz  and a low frequency filter of 1Hz . EEG data were recorded continuously and digitally stored.    Description: EEG showed continuous generalized and lateralized right hemisphere 3 to 6 Hz theta -delta slowing which at times appear rhythmic. Lateralized periodic discharges were also noted in right hemisphere, maximal right posterior quadrant, with fluctuating frequency of 0.5-1hz .    Seizure without clinical signs were noted, average 1-2/hour, lasting about 45 seconds. Concomitant EEG showed 6-7hz  theta slowing in right posterior quadrant which then involved all of right hemisphere and evolved into 2-3hz  delta slowing with overriding 15-18hz  beta activity. Average duration about 45 seconds.   Hyperventilation and photic stimulation were not performed.      ABNORMALITY -  Seizure without clinical signs, right posterior quadrant - Lateralized periodic discharges, right hemisphere, maximal right posterior quadrant - Continuous slow, generalized  and lateralized right hemisphere   IMPRESSION: This study showed seizure without clinical signs, average 1-2/hour,  lasting about 45 seconds each arising from right posterior quadrant.     Additionally, EEG showed evidence of epileptogenicity and cortical dysfunction arising from right hemisphere, maximal right posterior quadrant likely secondary to underlying structural abnormality.   Lastly, there is  severe diffuse encephalopathy, nonspecific etiology but could be secondary to sedation.    [name] 

## 2021-06-25 NOTE — Progress Notes (Signed)
LTM maintain at bedside. Moved all forehead leads to nearby rea due to skin redness. Impedances good. Results pending

## 2021-06-25 NOTE — Progress Notes (Signed)
eLink Physician-Brief Progress Note Patient Name: Julia [name] DOB: [birth year] MRN: [id number]   Date of Service  06/25/2021  HPI/Events of Note  Notified of SBP > 190, HR 80s Previously on labetalol  eICU Interventions  Re-ordered labetalol prn     Intervention Category Intermediate Interventions: Hypertension - evaluation and management  Darl Pikes 06/25/2021, 11:59 PM

## 2021-06-25 NOTE — Progress Notes (Signed)
NAME:  Julia [name], MRN:  [id number], DOB:  October 07, 1931, LOS: 5 ADMISSION DATE:  06/14/2021, CONSULTATION DATE:  06/18/2021 REFERRING MD:  Dr. Vanita Panda, ER, CHIEF COMPLAINT:  AMS   History of Present Illness:  87 yo female found unresponsive at home.  Required intubation for airway protection.  Found to have leukocytosis, lactic acidosis, hyperglycemia, hypernatremia, HTN (BP 229/119).  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  Pertinent  Medical History  GERD, Esophageal stricture, UTI, Rhabdo, CP, Lt eye aphakia, Cataracts, CVA, HTN, DM type 2  Significant Hospital Events: Including procedures, antibiotic start and stop dates in addition to other pertinent events   12/13 Admit, neurology and trauma consulted 12/14 new onset seizure, start LTM 12/16 2 short seizures 12/17 still having seizures. AEDs increased   Studies:  CT head 12/13 >> no acute infarction/hemorrhage/hydrocephalus; remote Lt parieto-occipital infarct CT chest 12/13 >> 3.2 cm Lt thyroid nodule (stable since 2020), scarring in upper lungs with cylindrical BTX LUL, prior Lt apical lung surgery, scoliosis EEG 12/13 >> generalized slowing and Rt hemisphere lateralization MRI brain 12/13 >> Restricted diffusion in Rt frontal parietal cortex primarily in Rt MCA territory but also Rt ACA/PCA and Lt ACA/MCA territories; concerning for embolic CVA CT angio head/neck 12/14 >> no large vessel occlusions EEG 12/15 >> focal seizure, Rt frontotemporal region; generalized burst suppression; generalized slowing 12/16 - EEG slow, 2 short seizures in PM, R sided eplileptogenic R hemisphere, comatose 12/17 comatose off sedation > 24 hours. 2 seizures on EEG 12/18 eeg: having 1-2 seizures/hr without clinical signs, lasting approx 45 sec each   Interim History / Subjective:  NAEO K 3.3 and Na 150 today  Remains off of continuous sedation -- now off for >48hrs   PSV 5/5  12/18 having 1-2 seizures/hr without clinical signs   Objective   Blood  pressure (!) 180/68, pulse 65, temperature 98 F (36.7 C), temperature source Axillary, resp. rate 18, height 5\' 2"  (1.575 m), weight 55.7 kg, SpO2 100 %.    Vent Mode: PSV;CPAP FiO2 (%):  [30 %] 30 % Set Rate:  [16 bmp] 16 bmp Vt Set:  [400 mL] 400 mL PEEP:  [5 cmH20] 5 cmH20 Pressure Support:  [8 [id number] cmH20] 8 cmH20 Plateau Pressure:  [13 cmH20-19 cmH20] 13 cmH20   Intake/Output Summary (Last 24 hours) at 06/25/2021 0952 Last data filed at 06/25/2021 0600 Gross per 24 hour  Intake 2019.16 ml  Output 2350 ml  Net -330.84 ml   Filed Weights   06/22/21 0441 06/24/21 0350 06/25/21 0406  Weight: 53.4 kg 57.6 kg 55.7 kg    Examination:  General - critically ill appearing, debilitated elderly F supine in bed minimally conscious  HEENT- NCAT pink mm L pupillary defect  CV- rrr s1s2 cap refill < 3  Pulm- Coarse breath sounds Even unlabored on PSV  Abdomen - soft round ndnt + bowel sounds Extremities - no acute joint deformities. Abducted BLE.  Skin - c/d/warm, wrinkled  Neuro - Weak flicker response to pain LLE. Initiates respirations   Resolved Hospital Problem list   Hyperkalemia, Lactic acidosis, Hypokalemia, hypertensive emergency  Assessment & Plan:   Acute metabolic encephalopathy  Acute embolic CVA New onset seizures P - AEDs -continue off sedation  Acute hypoxic respiratory failure in setting of above  Aspiration pneumonitis (POA) P - tolerating PSV 5/5 - PRN CXR  -pulm hygiene  DM2 with hyperglycemia - SSI with levemir  Recurrent UTI - s/p 5d abx   Anemia of critical illness  -  CBC 12/19 - transfuse for Hb < 7  GOC -if no improvement need to consider palliative care   Best Practice (right click and "Reselect all SmartList Selections" daily)   Diet/type: tubefeeds DVT prophylaxis: prophylactic heparin  GI prophylaxis: PPI Lines: Central line Foley:  N/A Code Status:  full code Last date of multidisciplinary goals of care discussion [will  attempt to discuss with family 12/17, grave prognosis off sedation and comatose]  Labs    CMP Latest Ref Rng & Units 06/25/2021 06/24/2021 06/23/2021  Glucose 70 - 99 mg/dL 681(L) 572(I) 203(T)  BUN 8 - 23 mg/dL 16 16 22   Creatinine 0.44 - 1.00 mg/dL 5.97 4.16  Sodium 135 - 145 mmol/L 150(H) 148(H) 143  Potassium 3.5 - 5.1 mmol/L 3.3(L) 3.6 3.3(L)  Chloride 98 - 111 mmol/L 113(H) 117(H) 115(H)  CO2 22 - 32 mmol/L 31 25 22   Calcium 8.9 - 10.3 mg/dL 8.1(L) 7.6(L) 7.3(L)  Total Protein 6.5 - 8.1 g/dL - - -  Total Bilirubin 0.3 - 1.2 mg/dL - - -  Alkaline Phos 38 - 126 U/L - - -  AST 15 - 41 U/L - - -  ALT 0 - 44 U/L - - -    CBC Latest Ref Rng & Units 06/23/2021 06/22/2021 06/21/2021  WBC 4.0 - 10.5 K/uL 16.7(H) 14.2(H) 19.0(H)  Hemoglobin 12.0 - 15.0 g/dL 06/24/2021) 06/23/2021) 5.3(M  Hematocrit 36.0 - 46.0 % 29.7(L) 31.8(L) 39.9  Platelets 150 - 400 K/uL 170 176 249    ABG    Component Value Date/Time   PHART 7.451 (H) 06/24/2021 1204   PCO2ART 31.8 (L) 06/15/2021 1204   PO2ART 539 (H) 06/14/2021 1204   HCO3 22.1 06/17/2021 1204   TCO2 23 06/23/2021 1204   ACIDBASEDEF 1.0 07/02/2021 1204   O2SAT 100.0 07/06/2021 1204    CBG (last 3)  Recent Labs    06/24/21 2309 06/25/21 0303 06/25/21 0728  GLUCAP 185* 107* 132*    Critical care time:    CRITICAL CARE Performed by: 06/27/21   Total critical care time: 37 minutes  Critical care time was exclusive of separately billable procedures and treating other patients. Critical care was necessary to treat or prevent imminent or life-threatening deterioration.  Critical care was time spent personally by me on the following activities: development of treatment plan with patient and/or surrogate as well as nursing, discussions with consultants, evaluation of patient's response to treatment, examination of patient, obtaining history from patient or surrogate, ordering and performing treatments and interventions, ordering  and review of laboratory studies, ordering and review of radiographic studies, pulse oximetry and re-evaluation of patient's condition.  06/27/21 MSN, AGACNP-BC [facility] Pulmonary/Critical Care Medicine Amion for pager 06/25/2021, 9:52 AM

## 2021-06-26 DIAGNOSIS — J9690 Respiratory failure, unspecified, unspecified whether with hypoxia or hypercapnia: Secondary | ICD-10-CM

## 2021-06-26 LAB — BASIC METABOLIC PANEL
Anion gap: 7 (ref 5–15)
BUN: 17 mg/dL (ref 8–23)
CO2: 28 mmol/L (ref 22–32)
Calcium: 8 mg/dL — ABNORMAL LOW (ref 8.9–10.3)
Chloride: 110 mmol/L (ref 98–111)
Creatinine, Ser: 0.47 mg/dL (ref 0.44–1.00)
GFR, Estimated: >60 mL/min (ref >60)
Glucose, Bld: 194 mg/dL — ABNORMAL HIGH (ref 70–99)
Potassium: 3.5 mmol/L (ref 3.5–5.1)
Sodium: 145 mmol/L (ref 135–145)

## 2021-06-26 LAB — GLUCOSE, CAPILLARY
Glucose-Capillary: 175 mg/dL — ABNORMAL HIGH (ref 70–99)
Glucose-Capillary: 131 mg/dL — ABNORMAL HIGH (ref 70–99)
Glucose-Capillary: 105 mg/dL — ABNORMAL HIGH (ref 70–99)
Glucose-Capillary: 211 mg/dL — ABNORMAL HIGH (ref 70–99)
Glucose-Capillary: 169 mg/dL — ABNORMAL HIGH (ref 70–99)
Glucose-Capillary: 184 mg/dL — ABNORMAL HIGH (ref 70–99)

## 2021-06-26 LAB — CULTURE, BLOOD (ROUTINE X 2)
Culture: NO GROWTH
Culture: NO GROWTH

## 2021-06-26 LAB — CBC
HCT: 32.3 % — ABNORMAL LOW (ref 36.0–46.0)
Hemoglobin: 9.9 g/dL — ABNORMAL LOW (ref 12.0–15.0)
MCH: 22.6 pg — ABNORMAL LOW (ref 26.0–34.0)
MCHC: 30.7 g/dL (ref 30.0–36.0)
MCV: 73.7 fL — ABNORMAL LOW (ref 80.0–100.0)
Platelets: 188 10*3/uL (ref 150–400)
RBC: 4.38 MIL/uL (ref 3.87–5.11)
RDW: 17 % — ABNORMAL HIGH (ref 11.5–15.5)
WBC: 12.8 10*3/uL — ABNORMAL HIGH (ref 4.0–10.5)
nRBC: 0.2 % (ref 0.0–0.2)

## 2021-06-26 LAB — VALPROIC ACID LEVEL: Valproic Acid Lvl: 67 ug/mL (ref 50.0–100.0)

## 2021-06-26 MED ORDER — POTASSIUM CHLORIDE 20 MEQ PO PACK
40.0000 meq | PACK | Freq: Once | ORAL | Status: AC
  Administered 2021-06-26: 10:00:00 40 meq
  Filled 2021-06-26: qty 2

## 2021-06-26 MED ORDER — FUROSEMIDE 10 MG/ML IJ SOLN
40.0000 mg | Freq: Once | INTRAMUSCULAR | Status: AC
Start: 2021-06-26 — End: 2021-06-26
  Administered 2021-06-26: 12:00:00 40 mg via INTRAVENOUS
  Filled 2021-06-26: qty 4

## 2021-06-26 NOTE — Progress Notes (Signed)
Spoke with son, Alba Cory. Discussed no change in neuro exam. Off analgesics and sedatives approaching 96 hours. Unlikely to have any meaningful neurologic recovery. Discussed considering comfort care, would be my recommendation. He will discuss with family and contact RN with time for family meeting. Recommended meeting tomorrow or next day at latest.

## 2021-06-26 NOTE — Progress Notes (Signed)
NAME:  Julia [name], MRN:  [id number], DOB:  1931/12/19, LOS: 6 ADMISSION DATE:  July 12, 2021, CONSULTATION DATE:  07/12/2021 REFERRING MD:  Dr. Jeraldine Loots, ER, CHIEF COMPLAINT:  AMS   History of Present Illness:  87 yo female found unresponsive at home.  Required intubation for airway protection.  Found to have leukocytosis, lactic acidosis, hyperglycemia, hypernatremia, HTN (BP 229/119).  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  Pertinent  Medical History  GERD, Esophageal stricture, UTI, Rhabdo, CP, Lt eye aphakia, Cataracts, CVA, HTN, DM type 2  Significant Hospital Events: Including procedures, antibiotic start and stop dates in addition to other pertinent events   12/13 Admit, neurology and trauma consulted. CT head 12/13 >> no acute infarction/hemorrhage/hydrocephalus; remote Lt parieto-occipital infarct. EEG 12/13 >> generalized slowing and Rt hemisphere lateralization.MRI brain 12/13 >> Restricted diffusion in Rt frontal parietal cortex primarily in Rt MCA territory but also Rt ACA/PCA and Lt ACA/MCA territories; concerning for embolic CVA 12/14 new onset seizure, start LTM. CT angio head/neck 12/14 >> no large vessel occlusions EG 12/15 >> focal seizure, Rt frontotemporal region; generalized burst suppression; generalized slowing 12/16 - EEG slow, 2 short seizures in PM, R sided eplileptogenic R hemisphere, comatose 12/16 2 short seizures 12/17 still having seizures. AEDs increased 12/17 comatose off sedation > 24 hours. 2 seizures on EEG 12/18 eeg: having 1-2 seizures/hr without clinical signs, lasting approx 45 sec each  12/19: Palliative care consult   Interim History / Subjective:  Remains comatose  Objective   Blood pressure (Abnormal) 152/67, pulse 63, temperature 98.4 F (36.9 C), temperature source Axillary, resp. rate 16, height 5\' 2"  (1.575 m), weight 55.7 kg, SpO2 100 %.    Vent Mode: PRVC FiO2 (%):  [30 %] 30 % Set Rate:  [16 bmp] 16 bmp Vt Set:  [400 mL] 400 mL PEEP:  [5  cmH20] 5 cmH20 Plateau Pressure:  [16 cmH20-17 cmH20] 17 cmH20   Intake/Output Summary (Last 24 hours) at 06/26/2021 0801 Last data filed at 06/26/2021 0600 Gross per 24 hour  Intake 2331.57 ml  Output 1475 ml  Net 856.57 ml   Filed Weights   06/22/21 0441 06/24/21 0350 06/25/21 0406  Weight: 53.4 kg 57.6 kg 55.7 kg    Examination:  General 87 year old female she is comatose on ventilatory support HEENT orally intubated, has chronic left pupillary deficit Pulmonary: Clear to auscultation currently on minimal ventilator support Cardiac regular rate and rhythm brisk capillary refill Extremities warm dry no significant edema Neuro: Has a cough, I cannot elicit gag.  Pupillary response on right brisk.  Withdraws lowers minimally Abdomen soft has Flexi-Seal in place  Resolved Hospital Problem list   Hyperkalemia, Lactic acidosis, Hypokalemia, hypertensive emergency Recurrent UTI- s/p 5d abx  Assessment & Plan:   Acute metabolic encephalopathy  Acute embolic CVA New onset seizures Remains comatose Plan Supportive care Continue AEDs   Acute hypoxic respiratory failure in setting of above  Aspiration pneumonitis (POA) Plan PSV as tolerated, unfortunately mental status prevents extubation VAP bundle PAD protocol  DM2 with hyperglycemia -Current glycemic control acceptable Plan Continue sliding scale insulin, currently on moderate resistant Continue Levemir 15 units every 12   Anemia of critical illness  -Hemoglobin stable Plan Continue to trend Transfusion trigger for hemoglobin less than 7  GOC -if no improvement need to consider palliative care  Plan Will reach out to family in regards to goals of care    Best Practice (right click and "Reselect all SmartList Selections" daily)   Diet/type: tubefeeds  DVT prophylaxis: prophylactic heparin  GI prophylaxis: PPI Lines: Central line Foley:  N/A Code Status:  full code Last date of multidisciplinary goals of  care discussion [will attempt to discuss with family 12/17, grave prognosis off sedation and comatose]  Critical care time: 32 min   CRITICAL CARE Performed by

## 2021-06-26 NOTE — Progress Notes (Signed)
D/C'd Patient. Patient had little to no skin breakdown. [facility] notified.

## 2021-06-26 NOTE — Progress Notes (Signed)
STROKE TEAM PROGRESS NOTE   SUBJECTIVE (INTERVAL HISTORY) No family at the bedside. Pt still intubated, for respiratory failure.  And unable to be weaned.  Off ventilatory support.  Off sedation, still not responsive, poor brainstem reflexes.  Trace withdrawal in left upper and lower extremity.  LTM EEG continues to show continued slow generalized slowing in the right posterior hemisphere but no more electrographic seizures  .  Depakote level 67 .Neurological exam is unchanged.  Vital signs stable.  Family is not yet decided time for family meeting to discuss goals of care.  Hopefully this will happen soon  OBJECTIVE Temp:  [98.4 F (36.9 C)-99.7 F (37.6 C)] 98.9 F (37.2 C) (12/19 1110) Pulse Rate:  [61-165] 165 (12/19 1200) Cardiac Rhythm: Normal sinus rhythm (12/19 1200) Resp:  [9-22] 16 (12/19 1300) BP: (135-204)/(58-86) 142/67 (12/19 1300) SpO2:  [87 %-100 %] 100 % (12/19 1200) FiO2 (%):  [30 %-40 %] 30 % (12/19 1200)  Recent Labs  Lab 06/25/21 1904 06/25/21 2306 06/26/21 0411 06/26/21 0712 06/26/21 1109  GLUCAP 209* 149* 175* 131* 105*   Recent Labs  Lab 06/21/21 0347 06/22/21 0437 06/23/21 0412 06/24/21 0250 06/25/21 0410 06/26/21 0410  NA 147* 146* 143 148* 150* 145  K 4.3 3.7 3.3* 3.6 3.3* 3.5  CL 117* 114* 115* 117* 113* 110  CO2 23 22 22 25 31 28   GLUCOSE 153* 285* 305* 209* 140* 194*  BUN 18 16 22 16 16 17   CREATININE 0.94 0.81 0.90 0.60 0.56 0.47  CALCIUM 9.3 8.2* 7.3* 7.6* 8.1* 8.0*  MG 1.9 1.5*  --   --   --   --   PHOS 1.7* 3.9  --   --   --   --    Recent Labs  Lab 07/03/2021 1126  AST 25  ALT 13  ALKPHOS 95  BILITOT 1.1  PROT 6.6  ALBUMIN 3.1*   Recent Labs  Lab 07/07/2021 1017 07/08/2021 1034 06/15/2021 1204 06/21/21 0347 06/22/21 0437 06/23/21 0412 06/26/21 0410  WBC 17.8*  --   --  19.0* 14.2* 16.7* 12.8*  HGB 14.4   < > 15.0 12.3 9.8* 9.1* 9.9*  HCT 47.2*   < > 44.0 39.9 31.8* 29.7* 32.3*  MCV 75.0*  --   --  75.1* 75.0* 74.6* 73.7*   PLT 290  --   --  249 176 170 188   < > = values in this interval not displayed.   No results for input(s): CKTOTAL, CKMB, CKMBINDEX, TROPONINI in the last 168 hours. No results for input(s): LABPROT, INR in the last 72 hours.  No results for input(s): COLORURINE, LABSPEC, [place], GLUCOSEU, HGBUR, BILIRUBINUR, KETONESUR, PROTEINUR, UROBILINOGEN, NITRITE, LEUKOCYTESUR in the last 72 hours.  Invalid input(s): APPERANCEUR      Component Value Date/Time   CHOL 94 06/21/2021 1114   TRIG 26 06/24/2021 0250   HDL 61 06/21/2021 1114   CHOLHDL 1.5 06/21/2021 1114   VLDL 8 06/21/2021 1114   LDLCALC 25 06/21/2021 1114   Lab Results  Component Value Date   HGBA1C 8.0 (H) 06/26/2021   No results found for: LABOPIA, [place], [place], [place], [place], [place]  Recent Labs  Lab 06/21/2021 1126  Daniels <10    I have personally reviewed the radiological images below and agree with the radiology interpretations.  CT ANGIO HEAD NECK W WO CM  Result Date: 06/21/2021 CLINICAL DATA:  Stroke/TIA, determine embolic source EXAM: CT ANGIOGRAPHY HEAD AND NECK TECHNIQUE: Multidetector CT imaging of the  head and neck was performed using the standard protocol during bolus administration of intravenous contrast. Multiplanar CT image reconstructions and MIPs were obtained to evaluate the vascular anatomy. Carotid stenosis measurements (when applicable) are obtained utilizing NASCET criteria, using the distal internal carotid diameter as the denominator. CONTRAST:  42mL OMNIPAQUE IOHEXOL 350 MG/ML SOLN COMPARISON:  CT and MRI brain 06/18/2021; MRA head 2015 FINDINGS: CT HEAD Brain: There is no acute intracranial hemorrhage. There is new loss of gray-white differentiation corresponding to some of the areas of infarction seen on the MRI, particularly in the right hemisphere. There is no mass effect. No hydrocephalus. Stable chronic infarcts and chronic microvascular ischemic changes. Vascular: No new finding.  Skull: Calvarium is unremarkable. Sinuses/Orbits: No acute finding. Other: None. Review of the MIP images confirms the above findings CTA NECK Aortic arch: Mild calcified plaque along the arch. Great vessel origins appear. Right carotid system: Patent.  No stenosis. Left carotid system: Patent. Plaque at the ICA origin causes less than 50% stenosis. Vertebral arteries: Patent.  Codominant.  No stenosis. Skeleton: Degenerative changes of the included spine. Other neck: Enlarged, heterogeneous thyroid previously evaluated by ultrasound. Endotracheal and enteric tubes are present Upper chest: Scarring with traction bronchiectasis. Review of the MIP images confirms the above findings CTA HEAD Anterior circulation: Intracranial internal carotid arteries are patent with calcified plaque causing mild stenosis. Anterior and middle cerebral arteries are patent. Atherosclerotic irregularity of distal branches. Posterior circulation: Intracranial vertebral arteries are patent. Basilar artery is patent. Major cerebellar artery origins are patent. Right posterior communicating artery is present. Posterior cerebral arteries are patent. Venous sinuses: Patent as allowed by contrast bolus timing. Review of the MIP images confirms the above findings IMPRESSION: No large vessel occlusion or hemodynamically significant stenosis in the neck. No proximal intracranial vessel occlusion. Atherosclerotic irregularity of medium to small vessel branches in the anterior circulation. Electronically Signed   By: Macy Mis M.D.   On: 06/21/2021 14:48   DG Abd 1 View  Result Date: 06/21/2021 CLINICAL DATA:  NG tube placement. EXAM: ABDOMEN - 1 VIEW COMPARISON:  CT scan 06/25/2021 FINDINGS: The bowel gas pattern is unremarkable. The NG tube tip is in the antropyloric region of the stomach. IMPRESSION: NG tube tip is in the antropyloric region of the stomach. Electronically Signed   By: Marijo Sanes M.D.   On: 06/21/2021 13:37   CT Head  Wo Contrast  Result Date: 07/04/2021 CLINICAL DATA:  Level 1 trauma.  Found down. EXAM: CT HEAD WITHOUT CONTRAST CT MAXILLOFACIAL WITHOUT CONTRAST CT CERVICAL SPINE WITHOUT CONTRAST TECHNIQUE: Multidetector CT imaging of the head, cervical spine, and maxillofacial structures were performed using the standard protocol without intravenous contrast. Multiplanar CT image reconstructions of the cervical spine and maxillofacial structures were also generated. COMPARISON:  None. FINDINGS: CT HEAD FINDINGS Brain: No evidence of acute infarction, hemorrhage, hydrocephalus, extra-axial collection or mass lesion/mass effect. 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 Vascular: No hyperdense vessel or unexpected calcification. Skull: No acute fracture CT MAXILLOFACIAL FINDINGS Osseous: No fracture or mandibular dislocation. No destructive process. Orbits: No evidence of injury Sinuses: Negative for hemosinus Soft tissues: No opaque foreign body or measurable hematoma. CT CERVICAL SPINE FINDINGS Alignment: No traumatic malalignment. Skull base and vertebrae: No acute fracture. No primary bone lesion or focal pathologic process. Soft tissues and spinal canal: No prevertebral fluid or swelling. No visible canal hematoma. Disc levels: Generalized disc collapse with endplate and facet spurring narrowing foramina diffusely, foraminal narrowing especially along the right-side  of a scoliotic curvature. Upper chest: Reported separately IMPRESSION: No evidence of acute intracranial or cervical spine injury. Negative for facial fracture. Electronically Signed   By: Tiburcio Pea M.D.   On: 07/10/21 11:07   CT Cervical Spine Wo Contrast  Result Date: 07-10-21 CLINICAL DATA:  Level 1 trauma.  Found down. EXAM: CT HEAD WITHOUT CONTRAST CT MAXILLOFACIAL WITHOUT CONTRAST CT CERVICAL SPINE WITHOUT CONTRAST TECHNIQUE: Multidetector CT imaging of the head, cervical spine, and maxillofacial structures were  performed using the standard protocol without intravenous contrast. Multiplanar CT image reconstructions of the cervical spine and maxillofacial structures were also generated. COMPARISON:  None. FINDINGS: CT HEAD FINDINGS Brain: No evidence of acute infarction, hemorrhage, hydrocephalus, extra-axial collection or mass lesion/mass effect. 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 Vascular: No hyperdense vessel or unexpected calcification. Skull: No acute fracture CT MAXILLOFACIAL FINDINGS Osseous: No fracture or mandibular dislocation. No destructive process. Orbits: No evidence of injury Sinuses: Negative for hemosinus Soft tissues: No opaque foreign body or measurable hematoma. CT CERVICAL SPINE FINDINGS Alignment: No traumatic malalignment. Skull base and vertebrae: No acute fracture. No primary bone lesion or focal pathologic process. Soft tissues and spinal canal: No prevertebral fluid or swelling. No visible canal hematoma. Disc levels: Generalized disc collapse with endplate and facet spurring narrowing foramina diffusely, foraminal narrowing especially along the right-side of a scoliotic curvature. Upper chest: Reported separately IMPRESSION: No evidence of acute intracranial or cervical spine injury. Negative for facial fracture. Electronically Signed   By: Tiburcio Pea M.D.   On: 2021-07-10 11:07   MR BRAIN WO CONTRAST  Result Date: July 10, 2021 CLINICAL DATA:  Neuro deficit, stroke suspected EXAM: MRI HEAD WITHOUT CONTRAST TECHNIQUE: Multiplanar, multiecho pulse sequences of the brain and surrounding structures were obtained without intravenous contrast. COMPARISON:  05/17/2019. FINDINGS: Brain: Areas of cortical restricted diffusion with ADC correlate in the right MCA territory (series 5, image 95, for example), with additional smaller areas of cortical restricted diffusion in the right PCA territory (series 5, image 76-7) and right ACA territory (series 5, image 97-99), with  additional more punctate right MCA white matter infarcts (series 5, image 86 for example), and with additional scattered left frontal infarcts (series 5, images 88 and 92). No significant gyral swelling. No acute hemorrhage, mass, mass effect, or midline shift. Confluent T2 hyperintense signal in the periventricular white matter and pons, likely the sequela of severe chronic small vessel ischemic disease. Remote left parietal infarct. Vascular: Normal flow voids. Skull and upper cervical spine: Choose 1 Sinuses/Orbits: Mucosal thickening in the right frontal sinus, right ethmoid air cells, right sphenoid sinus, and right maxillary sinus. Status post bilateral lens replacements. Other: None. IMPRESSION: Restricted diffusion in the right frontal parietal cortex, primarily in the right MCA territory, but also to a lesser extent in the right ACA, right PCA, and left ACA and MCA territory. This is overall concerning for an embolic etiology. No evidence of hemorrhagic conversion, mass effect, or midline shift. These results will be called to the ordering clinician or representative by the Radiologist Assistant, and communication documented in the PACS or [employer]. Electronically Signed   By: Wiliam Ke M.D.   On: 07/10/21 23:39   DG Pelvis Portable  Result Date: 07-10-2021 CLINICAL DATA:  Trauma EXAM: PORTABLE PELVIS 1-2 VIEWS COMPARISON:  None. FINDINGS: Patient is rotated to the left which somewhat evaluation. There is no evidence of pelvic fracture or diastasis. No pelvic bone lesions are seen. Limited evaluation of the sacrum due to  overlying bowel gas. IMPRESSION: No acute osseous abnormality. Electronically Signed   By: Yetta Glassman M.D.   On: 06/11/2021 10:46   DG Chest Port 1 View  Result Date: 06/23/2021 CLINICAL DATA:  Respiratory failure EXAM: PORTABLE CHEST 1 VIEW COMPARISON:  Chest radiograph 06/21/2021 FINDINGS: The endotracheal tube tip is approximately 3.4 cm from the carina. The  left sided vascular catheter terminates at the cavoatrial junction. The enteric catheter tip projects over the region of the pylorus. The cardiomediastinal silhouette is stable. There are likely small bilateral pleural effusions with adjacent subsegmental atelectasis. Otherwise, there is no new focal consolidation. There is no pulmonary edema. There is no appreciable pneumothorax. Chain sutures in the left upper lobe are unchanged. IMPRESSION: Small bilateral pleural effusions with adjacent atelectasis, increased in conspicuity since 06/21/2021. Electronically Signed   By: Valetta Mole M.D.   On: 06/23/2021 09:22   DG CHEST PORT 1 VIEW  Result Date: 06/21/2021 CLINICAL DATA:  Shortness of breath EXAM: PORTABLE CHEST 1 VIEW COMPARISON:  Previous studies including the examination of 06/30/2021 FINDINGS: Apparent shift of mediastinum to the left may be due to rotation. Transverse diameter of heart is within normal limits. Thoracic aorta is tortuous and ectatic. Tip of endotracheal tube is 4.1 cm above the carina. Tip of left jugular central venous catheter is seen at the junction of superior vena cava and right atrium. Tip of enteric tube is seen in the stomach. Side-port in the enteric tube is in the lower thoracic esophagus close to gastroesophageal junction. Linear densities in the right upper lung fields may suggest scarring or subsegmental atelectasis. There is no new focal pulmonary consolidation. There is no pleural effusion or pneumothorax. IMPRESSION: There are no new infiltrates or signs of pulmonary edema. Side-port in enteric tube is seen in the lower thoracic esophagus close to gastroesophageal junction. Enteric tube could be advanced 5-10 cm to place the side port within the stomach. Electronically Signed   By: Elmer Picker M.D.   On: 06/21/2021 08:08   DG Chest Portable 1 View  Result Date: 06/12/2021 CLINICAL DATA:  Central line placement EXAM: PORTABLE CHEST 1 VIEW COMPARISON:   Previous studies including examination done earlier today. FINDINGS: Cardiac size is within normal limits. Thoracic aorta is tortuous and ectatic. Tip of endotracheal tube is approximately 3.6 cm above the carina. Enteric tube is noted traversing the esophagus with its side port at the gastroesophageal junction. There is interval placement of left internal jugular central venous catheter with its tip in the region of junction of superior vena cava and right atrium. Linear density is seen in the right upper lung fields and left mid lung fields suggesting with no significant interval change suggesting scarring or subsegmental atelectasis. There is no new focal consolidation. There are no signs of alveolar pulmonary edema. IMPRESSION: There are no new infiltrates or signs of pulmonary edema. Linear densities in the right upper lobe and left mid lung fields may suggest scarring or subsegmental atelectasis. Other findings as described in the body of the report. Electronically Signed   By: Elmer Picker M.D.   On: 06/24/2021 12:59   DG Chest Port 1 View  Result Date: 07/06/2021 CLINICAL DATA:  Trauma EXAM: PORTABLE CHEST 1 VIEW COMPARISON:  Chest x-ray dated November 02, 2020 FINDINGS: ET tube tip projects approximately 1.0 cm from the carina. Enteric tube partially seen coursing below diaphragm. Evaluation is limited due to left port patient rotation. Bilateral upper lung linear opacities are unchanged compared to prior  exam likely due to scarring or atelectasis. No focal consolidation. No large pleural effusion or evidence of pneumothorax. Limited evaluation of cardiac and mediastinal contours due to patient rotation. IMPRESSION: No acute cardiopulmonary no acute cardiopulmonary abnormality, evaluation somewhat limited due to patient rotation. Electronically Signed   By: Yetta Glassman M.D.   On: 06/18/2021 10:44   EEG adult  Result Date: 07/08/2021 Lora Havens, MD     06/28/2021  2:29 PM Patient  Name: MAIZY FORNASH MRN: [id number] Epilepsy Attending: Lora Havens Referring Physician/Provider: Dr Ina Homes Date: 06/09/2021 Duration: 24.16 mins Patient history: 87yo F with ams. EEG to evaluate for seizure Level of alertness: comatose AEDs during EEG study: Propofol Technical aspects: This EEG study was done with scalp electrodes positioned according to the 10-20 International system of electrode placement. Electrical activity was acquired at a sampling rate of 500Hz  and reviewed with a high frequency filter of 70Hz  and a low frequency filter of 1Hz . EEG data were recorded continuously and digitally stored. Description: EEG showed continuous low amplitude 2-3hz  delta slowing in right hemisphere. There was also predominantly 5-8Hz  theta-alpha activity in left hemisphere admixed with intermittent 2-3hz  delta slowing. Hyperventilation and photic stimulation were not performed.   ABNORMALITY - Continuous slow, generalized and lateralized right hemisphere IMPRESSION: This study is suggestive of cortical dysfunction arising from right hemisphere, nonspecific etiology but likely secondary to underlying structural abnormality, post-ictal state. Additionally there is moderate diffuse encephalopathy, nonspecific etiology but likely could be secondary to sedation. No seizures or epileptiform discharges were seen throughout the recording. Dr Rory Percy was notified. Priyanka Barbra Sarks   Overnight EEG with video  Result Date: 06/22/2021 Lora Havens, MD     06/23/2021  9:24 AM Patient Name: HERMENA FLATEN MRN: [id number] Epilepsy Attending: Lora Havens Referring Physician/Provider: Dr Rosalin Hawking Duration: 06/21/2021 1445 to 06/22/2021 1445  Patient history: 87yo F with ams. EEG to evaluate for seizure  Level of alertness: comatose  AEDs during EEG study: Propofol, LEV,VPA  Technical aspects: This EEG study was done with scalp electrodes positioned according to the 10-20 International system of electrode  placement. Electrical activity was acquired at a sampling rate of 500Hz  and reviewed with a high frequency filter of 70Hz  and a low frequency filter of 1Hz . EEG data were recorded continuously and digitally stored.  Description: EEG initially showed continuous low amplitude 2-3hz  delta slowing in right hemisphere. There was also predominantly 5-8Hz  theta-alpha activity in left hemisphere admixed with intermittent 2-3hz  delta slowing.  After around 2330 on 06/21/2021, as medications were adjusted, EEG was suggestive of burst attenuation pattern with bursts of low amplitude polymorphic asynchronous 2 to 3 Hz delta slowing with overriding 15 to 18 Hz beta activity lasting 2 to 3 seconds alternating with 5 to 8 seconds of generalized EEG attenuation. Patient event button was pressed on 06/22/2021 at 2001, 2016, 2216, 2038, 2253, 2309 and 2315 for left side neck jerking and possible left arm jerking ( covered under blanket).  Concomitant EEG initially showed sharply contoured 6 to 8 Hz theta-alpha activity in the right frontal temporal region which gradually evolved into 2 to 3 Hz delta slowing. Hyperventilation and photic stimulation were not performed.    ABNORMALITY - Focal seizure, right frontotemporal region  - Continuous slow, generalized and lateralized right hemisphere - Burst attenuation, generalized  IMPRESSION: This study showed focal seizures arising from right frontotemporal region on 06/22/2021 at 2001, 2016, 2216, 2038, 2253, 2309 and 2315, lasting about 2 minute  each.  During the seizure patient was noted to have left-sided neck jerking and possible left arm jerking ( covered under blanket). EEG was also suggestive of cortical dysfunction arising from right hemisphere, nonspecific etiology but likely secondary to underlying structural abnormality, post-ictal state.  Additionally EEG was initially suggestive of moderate diffuse encephalopathy.  After around 2330 on 06/21/2021, as medications were adjusted,  EEG was suggestive of profound diffuse encephalopathy, nonspecific etiology but likely could be secondary to sedation.  Lora Havens   ECHOCARDIOGRAM COMPLETE  Result Date: 06/23/2021    ECHOCARDIOGRAM REPORT   Patient Name:   TANJI KUETHER [facility] Date of Exam: 06/23/2021 Medical Rec #:  [id number]     Height:       62.0 in Accession #:    [id number]    Weight:       117.7 lb Date of Birth:  July 25, 1931     BSA:          1.526 m Patient Age:    74 years      BP:           149/58 mmHg Patient Gender: F             HR:           80 bpm. Exam Location:  Inpatient Procedure: 2D Echo, 3D Echo, Cardiac Doppler, Color Doppler and Strain Analysis Indications:    Stroke I63.9  History:        Patient has no prior history of Echocardiogram examinations.                 Signs/Symptoms:Fever; Risk Factors:Diabetes and Hypertension.  Sonographer:    Darlina Sicilian RDCS Referring Phys: [id number] [facility]  Sonographer Comments: Echo performed with patient supine and on artificial respirator. IMPRESSIONS  1. Left ventricular ejection fraction, by estimation, is 65 to 70%. The left ventricle has normal function. The left ventricle has no regional wall motion abnormalities. There is mild left ventricular hypertrophy of the basal segment. Left ventricular diastolic parameters are consistent with Grade I diastolic dysfunction (impaired relaxation).  2. Right ventricular systolic function is normal. The right ventricular size is normal. Tricuspid regurgitation signal is inadequate for assessing PA pressure.  3. The mitral valve is grossly normal. No evidence of mitral valve regurgitation.  4. The aortic valve is grossly normal. Aortic valve regurgitation is not visualized.  5. The inferior vena cava is normal in size with greater than 50% respiratory variability, suggesting right atrial pressure of 3 mmHg. Comparison(s): No prior Echocardiogram. Conclusion(s)/Recommendation(s): Normal biventricular function without evidence of  hemodynamically significant valvular heart disease. FINDINGS  Left Ventricle: Left ventricular ejection fraction, by estimation, is 65 to 70%. The left ventricle has normal function. The left ventricle has no regional wall motion abnormalities. Global longitudinal strain performed but not reported based on interpreter judgement due to suboptimal tracking. The left ventricular internal cavity size was normal in size. There is mild left ventricular hypertrophy of the basal segment. Left ventricular diastolic parameters are consistent with Grade I diastolic dysfunction (impaired relaxation). Right Ventricle: The right ventricular size is normal. No increase in right ventricular wall thickness. Right ventricular systolic function is normal. Tricuspid regurgitation signal is inadequate for assessing PA pressure. Left Atrium: Left atrial size was normal in size. Right Atrium: Right atrial size was normal in size. Pericardium: There is no evidence of pericardial effusion. Mitral Valve: The mitral valve is grossly normal. No evidence of mitral valve regurgitation. Tricuspid Valve: The tricuspid valve  is grossly normal. Tricuspid valve regurgitation is not demonstrated. Aortic Valve: Focal calcification. The aortic valve is grossly normal. Aortic valve regurgitation is not visualized. Pulmonic Valve: The pulmonic valve was grossly normal. Pulmonic valve regurgitation is not visualized. Aorta: The aortic root and ascending aorta are structurally normal, with no evidence of dilitation. Venous: The inferior vena cava is normal in size with greater than 50% respiratory variability, suggesting right atrial pressure of 3 mmHg. IAS/Shunts: No atrial level shunt detected by color flow Doppler.  LEFT VENTRICLE PLAX 2D LVIDd:         3.60 cm   Diastology LVIDs:         2.00 cm   LV e' medial:    5.88 cm/s LV PW:         1.00 cm   LV E/e' medial:  16.1 LV IVS:        1.50 cm   LV e' lateral:   6.20 cm/s LVOT diam:     1.70 cm   LV E/e'  lateral: 15.3 LV SV:         57 LV SV Index:   37 LVOT Area:     2.27 cm                           3D Volume EF:                          3D EF:        71 %                          LV EDV:       68 ml                          LV ESV:       20 ml                          LV SV:        49 ml RIGHT VENTRICLE RV S prime:     11.60 cm/s TAPSE (M-mode): 2.4 cm LEFT ATRIUM             Index        RIGHT ATRIUM          Index LA diam:        3.10 cm 2.03 cm/m   RA Area:     6.94 cm LA Vol (A2C):   20.5 ml 13.43 ml/m  RA Volume:   11.80 ml 7.73 ml/m LA Vol (A4C):   36.5 ml 23.92 ml/m LA Biplane Vol: 27.7 ml 18.15 ml/m  AORTIC VALVE LVOT Vmax:   115.00 cm/s LVOT Vmean:  82.800 cm/s LVOT VTI:    0.252 m  AORTA Ao Root diam: 2.80 cm Ao Asc diam:  3.00 cm MITRAL VALVE MV Area (PHT): 3.65 cm     SHUNTS MV Decel Time: 208 msec     Systemic VTI:  0.25 m MV E velocity: 94.70 cm/s   Systemic Diam: 1.70 cm MV A velocity: 110.00 cm/s MV E/A ratio:  0.86 Mary Scientist, physiological signed by Phineas Inches Signature Date/Time: 06/23/2021/2:28:46 PM    Final    VAS [place] LOWER EXTREMITY VENOUS (DVT)  Result Date: 06/22/2021  Lower Venous DVT Study Patient Name:  ANGELIGUE  Starr Sinclair  Date of Exam:   06/22/2021 Medical Rec #: [id number]      Accession #:    [id number] Date of Birth: 08-Oct-1931      Patient Gender: F Patient Age:   46 years Exam Location:  [facility] Procedure:      VAS [place] LOWER EXTREMITY VENOUS (DVT) Referring Phys: Cornelius Moras XU --------------------------------------------------------------------------------  Indications: Stroke.  Comparison Study: no prior Performing Technologist: Archie Patten RVS  Examination Guidelines: A complete evaluation includes B-mode imaging, spectral Doppler, color Doppler, and power Doppler as needed of all accessible portions of each vessel. Bilateral testing is considered an integral part of a complete examination. Limited examinations for reoccurring indications may be performed as  noted. The reflux portion of the exam is performed with the patient in reverse Trendelenburg.  +---------+---------------+---------+-----------+----------+--------------+  RIGHT     Compressibility Phasicity Spontaneity Properties Thrombus Aging  +---------+---------------+---------+-----------+----------+--------------+  CFV       Full            Yes       Yes                                    +---------+---------------+---------+-----------+----------+--------------+  SFJ       Full                                                             +---------+---------------+---------+-----------+----------+--------------+  FV Prox   Full                                                             +---------+---------------+---------+-----------+----------+--------------+  FV Mid    Full            Yes       Yes                                    +---------+---------------+---------+-----------+----------+--------------+  FV Distal Full            Yes       Yes                                    +---------+---------------+---------+-----------+----------+--------------+  PFV       Full                                                             +---------+---------------+---------+-----------+----------+--------------+  POP       Full            Yes       Yes                                    +---------+---------------+---------+-----------+----------+--------------+  PTV       Full                                                             +---------+---------------+---------+-----------+----------+--------------+  PERO      Full                                                             +---------+---------------+---------+-----------+----------+--------------+   +---------+---------------+---------+-----------+----------+--------------+  LEFT      Compressibility Phasicity Spontaneity Properties Thrombus Aging  +---------+---------------+---------+-----------+----------+--------------+  CFV       Full             Yes       Yes                                    +---------+---------------+---------+-----------+----------+--------------+  SFJ       Full                                                             +---------+---------------+---------+-----------+----------+--------------+  FV Prox   Full                                                             +---------+---------------+---------+-----------+----------+--------------+  FV Mid    Full                                                             +---------+---------------+---------+-----------+----------+--------------+  FV Distal                 Yes       Yes                                    +---------+---------------+---------+-----------+----------+--------------+  PFV       Full                                                             +---------+---------------+---------+-----------+----------+--------------+  POP       Full            Yes       Yes                                    +---------+---------------+---------+-----------+----------+--------------+  PTV       Full                                                             +---------+---------------+---------+-----------+----------+--------------+  PERO      Full                                                             +---------+---------------+---------+-----------+----------+--------------+     Summary: BILATERAL: - No evidence of deep vein thrombosis seen in the lower extremities, bilaterally. -No evidence of popliteal cyst, bilaterally.   *See table(s) above for measurements and observations. Electronically signed by Sherald Hesshristopher Clark MD on 06/22/2021 at 7:48:31 PM.    Final    CT CHEST ABDOMEN PELVIS WO CONTRAST  Result Date: 2021/03/27 CLINICAL DATA:  Level 1 trauma. Fall. EXAM: CT CHEST, ABDOMEN AND PELVIS WITHOUT CONTRAST TECHNIQUE: Multidetector CT imaging of the chest, abdomen and pelvis was performed following the standard protocol without IV contrast. COMPARISON:  None  similar FINDINGS: CT CHEST FINDINGS Cardiovascular: Normal heart size. Trace anterior pericardial effusion which appears low-density. Coronary atherosclerosis. Mediastinum/Nodes: Endotracheal and enteric tubes are in unremarkable position. No hematoma or pneumomediastinum. 3.2 cm left thyroid nodule, unchanged from 2020. This was evaluated by ultrasound in 2019. No follow-up recommended unless clinically warranted (ref: J Am Coll Radiol. 2015 Feb;12(2): 143-50). Lungs/Pleura: Bands of scarring in the upper lungs with mild, cylindrical left upper lobe bronchiectasis. Prior left apical surgery with lung sutures. No hemothorax, pneumothorax, or lung contusion. Musculoskeletal: Scoliosis.  No acute fracture or subluxation. CT ABDOMEN PELVIS FINDINGS Hepatobiliary: No hepatic injury or perihepatic hematoma. Gallbladder is unremarkable Pancreas: Unremarkable Spleen: No splenic injury or perisplenic hematoma. Adrenals/Urinary Tract: No adrenal hemorrhage or renal injury identified. Bladder is unremarkable. Bilateral nephrolithiasis. Adjacent or lobulated right lower pole calculus in total measuring 9 mm. 3 mm interpolar left renal calculus. Stomach/Bowel: No evidence of injury. Rectal stool distention. The enteric tube tip is at the stomach. Vascular/Lymphatic: Stranding around the right femoral vessels related to phlebotomy per report. Atheromatous calcification. Reproductive: Hysterectomy Other: No ascites or pneumoperitoneum Musculoskeletal: Scoliosis and ordinary degeneration for age. No acute fracture or subluxation. IMPRESSION: No evidence of acute intrathoracic or intra-abdominal injury. Electronically Signed   By: Tiburcio PeaJonathan  Watts M.D.   On: 2021/03/27 11:05   CT Maxillofacial Wo Contrast  Result Date: 2021/03/27 CLINICAL DATA:  Level 1 trauma.  Found down. EXAM: CT HEAD WITHOUT CONTRAST CT MAXILLOFACIAL WITHOUT CONTRAST CT CERVICAL SPINE WITHOUT CONTRAST TECHNIQUE: Multidetector CT imaging of the head,  cervical spine, and maxillofacial structures were performed using the standard protocol without intravenous contrast. Multiplanar CT image reconstructions of the cervical spine and maxillofacial structures were also generated. COMPARISON:  None. FINDINGS: CT HEAD FINDINGS Brain: No evidence of acute infarction, hemorrhage, hydrocephalus, extra-axial collection or mass lesion/mass effect. 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 Vascular: No hyperdense vessel or unexpected calcification. Skull: No acute fracture CT MAXILLOFACIAL FINDINGS Osseous: No fracture or mandibular dislocation. No destructive process. Orbits: No evidence of injury Sinuses: Negative for hemosinus Soft tissues: No opaque foreign body or measurable hematoma. CT  CERVICAL SPINE FINDINGS Alignment: No traumatic malalignment. Skull base and vertebrae: No acute fracture. No primary bone lesion or focal pathologic process. Soft tissues and spinal canal: No prevertebral fluid or swelling. No visible canal hematoma. Disc levels: Generalized disc collapse with endplate and facet spurring narrowing foramina diffusely, foraminal narrowing especially along the right-side of a scoliotic curvature. Upper chest: Reported separately IMPRESSION: No evidence of acute intracranial or cervical spine injury. Negative for facial fracture. Electronically Signed   By: Jorje Guild M.D.   On: 06/11/2021 11:07     PHYSICAL EXAM  Temp:  [98.4 F (36.9 C)-99.7 F (37.6 C)] 98.9 F (37.2 C) (12/19 1110) Pulse Rate:  [61-165] 165 (12/19 1200) Resp:  [9-22] 16 (12/19 1300) BP: (135-204)/(58-86) 142/67 (12/19 1300) SpO2:  [87 %-100 %] 100 % (12/19 1200) FiO2 (%):  [30 %-40 %] 30 % (12/19 1200)  General - Well nourished, well developed elderly lady, intubated off sedation.  Ophthalmologic - fundi not visualized due to noncooperation.  Cardiovascular - Regular rate and rhythm.  Neuro - intubated  off sedation but remains unresponsive,  eyes closed, not following commands. With forced eye opening, eyes in mid position, not blinking to visual threat, doll's eyes absent, not tracking, right pupil 1.6mm but reactive, left pupil pinpoint but irregular shape with evidence of prior ophthalmic surgery. Corneal reflex absent bilaterally, gag and cough present. Breathing over the vent.  Facial symmetry not able to test due to ET tube.  Tongue protrusion not cooperative. On pain stimulation, no significant movement of all extremities except trace withdrawal in the left greater than right lower extremity.. No babinski. Sensation, coordination and gait not tested.   ASSESSMENT/PLAN Ms. KEHLANIE TENSLEY is a 87 y.o. female with history of cerebral palsy, hypertension, diabetes, sarcoidosis admitted for #home with facial trauma, altered mental status. No tPA given due to unknown last known well.    Stroke:  right MCA and ACA infarcts embolic pattern secondary to unclear source, concerning for occult A. fib CT no acute abnormality MRI right MCA, MCA/PCA and MCA/ACA scattered infarcts CT head and neck unremarkable 2D Echo ejection fraction 65 to 70%. LE venous Doppler no DVT LDL 25 HgbA1c 8.0 Heparin subcu for VTE prophylaxis aspirin 81 mg daily prior to admission, now on aspirin 325 mg daily given potential procedures (trach or PEG) Ongoing aggressive stroke risk factor management Therapy recommendations: Pending Disposition: Pending, agree with CCM, if pt not make much progress over the weekend, will consider palliative care involvement.   Seizure Reported right facial twitching and right arm jerking movement Received Versed and propofol bolus EEG 12/13 cortical dysfunction arising from right hemisphere, nonspecific etiology  LTM EEG showed focal seizures arising from right frontotemporal region lasting about 2 minute each.  During the seizure patient was noted to have left-sided neck jerking and possible left arm jerking  Keppra load  followed by 500 bid->1500 bid Depakote load x 2, increased to 750 milligrams Q6 Depakote level 33 -> 55. >62 Now off sedation, few brief silent seizures continue on long-term EEG monitoring Fever Leukocytosis  Spiking fever 12/13, T-max 100.9->afebrile Today afebrile WBC 17.8-19.0-14.2-16.7 UA WBC 20-51 CXR unremarkable Blood culture NGTD On cefepime -> zosyn  Diabetes, uncontrolled HgbA1c 8.0 goal < 7.0 Uncontrolled Currently on Levemir 5->10->15 U twice daily CBG monitoring SSI DM education and close PCP follow up  Respiratory failure Intubated on sedation CCM on board Not candidate for extubation today  Hypertension Stable Long term BP goal normotensive  Other Stroke  Risk Factors Advanced age  Other Active Problems Cerebral palsy Sarcoidosis  Hospital day # 6 Patient neurological exam continues to remain quite poor and long-term EEG now shows no ongoing seizures.  Patient does not wake up prognosis will remain poor.  We will need to have goals of care discussion with family when available.  Discussed with Dr. Silas Flood critical care medicine.  Discussed with patient's son 06/24/21 afternoon and answered questions. This patient is critically ill due to right MCA and ACA infarcts, seizure, leukocytosis, respiratory failure, dysphagia and at significant risk of neurological worsening, death form recurrent stroke, status epilepticus, sepsis, heart failure. This patient's care requires constant monitoring of vital signs, hemodynamics, respiratory and cardiac monitoring, review of multiple databases, neurological assessment, discussion with family, other specialists and medical decision making of high complexity. I spent 30 minutes of neurocritical care time in the care of this patient.   Antony Contras, MD Stroke Neurology 06/26/2021 1:55 PM    To contact Stroke Continuity provider, please refer to [email or web address] After hours, contact General Neurology

## 2021-06-26 NOTE — Procedures (Addendum)
Patient Name: Julia [name]  MRN: [id number]  Epilepsy Attending: Charlsie Quest  Referring Physician/Provider: Dr Marvel Plan Duration: 06/25/2021 1445 to 06/26/2021 1430   Patient history: 87yo F with ams. EEG to evaluate for seizure   Level of alertness: comatose   AEDs during EEG study: LEV,VPA   Technical aspects: This EEG study was done with scalp electrodes positioned according to the 10-20 International system of electrode placement. Electrical activity was acquired at a sampling rate of 500Hz  and reviewed with a high frequency filter of 70Hz  and a low frequency filter of 1Hz . EEG data were recorded continuously and digitally stored.    Description: EEG showed continuous generalized and lateralized right hemisphere 3 to 6 Hz theta -delta slowing which at times appear rhythmic. Hyperventilation and photic stimulation were not performed.     ABNORMALITY - Continuous slow, generalized  and lateralized right hemisphere   IMPRESSION: This study is suggestive of cortical dysfunction arising from right hemisphere, maximal right posterior quadrant likely secondary to underlying structural abnormality. Additionally there is severe diffuse encephalopathy, nonspecific etiology.  No seizures were seen during the study.   [name] 

## 2021-06-27 DIAGNOSIS — J9690 Respiratory failure, unspecified, unspecified whether with hypoxia or hypercapnia: Secondary | ICD-10-CM

## 2021-06-27 DIAGNOSIS — D72829 Elevated white blood cell count, unspecified: Secondary | ICD-10-CM

## 2021-06-27 DIAGNOSIS — E119 Type 2 diabetes mellitus without complications: Secondary | ICD-10-CM

## 2021-06-27 DIAGNOSIS — I63311 Cerebral infarction due to thrombosis of right middle cerebral artery: Secondary | ICD-10-CM

## 2021-06-27 DIAGNOSIS — I10 Essential (primary) hypertension: Secondary | ICD-10-CM

## 2021-06-27 LAB — CBC WITH DIFFERENTIAL/PLATELET
Abs Immature Granulocytes: 0 10*3/uL (ref 0.00–0.07)
Basophils Absolute: 0 10*3/uL (ref 0.0–0.1)
Basophils Relative: 0 %
Eosinophils Absolute: 0 10*3/uL (ref 0.0–0.5)
Eosinophils Relative: 0 %
HCT: 34.2 % — ABNORMAL LOW (ref 36.0–46.0)
Hemoglobin: 10.6 g/dL — ABNORMAL LOW (ref 12.0–15.0)
Lymphocytes Relative: 13 %
Lymphs Abs: 1.6 10*3/uL (ref 0.7–4.0)
MCH: 22.9 pg — ABNORMAL LOW (ref 26.0–34.0)
MCHC: 31 g/dL (ref 30.0–36.0)
MCV: 74 fL — ABNORMAL LOW (ref 80.0–100.0)
Monocytes Absolute: 0.7 10*3/uL (ref 0.1–1.0)
Monocytes Relative: 6 %
Neutro Abs: 9.7 10*3/uL — ABNORMAL HIGH (ref 1.7–7.7)
Neutrophils Relative %: 81 %
Platelets: 190 10*3/uL (ref 150–400)
RBC: 4.62 MIL/uL (ref 3.87–5.11)
RDW: 16.9 % — ABNORMAL HIGH (ref 11.5–15.5)
WBC: 12 10*3/uL — ABNORMAL HIGH (ref 4.0–10.5)
nRBC: 0 % (ref 0.0–0.2)
nRBC: 0 /100 WBC

## 2021-06-27 LAB — BASIC METABOLIC PANEL
Anion gap: 4 — ABNORMAL LOW (ref 5–15)
BUN: 22 mg/dL (ref 8–23)
CO2: 32 mmol/L (ref 22–32)
Calcium: 8.1 mg/dL — ABNORMAL LOW (ref 8.9–10.3)
Chloride: 104 mmol/L (ref 98–111)
Creatinine, Ser: 0.53 mg/dL (ref 0.44–1.00)
GFR, Estimated: >60 mL/min (ref >60)
Glucose, Bld: 217 mg/dL — ABNORMAL HIGH (ref 70–99)
Potassium: 3.4 mmol/L — ABNORMAL LOW (ref 3.5–5.1)
Sodium: 140 mmol/L (ref 135–145)

## 2021-06-27 LAB — GLUCOSE, CAPILLARY
Glucose-Capillary: 166 mg/dL — ABNORMAL HIGH (ref 70–99)
Glucose-Capillary: 191 mg/dL — ABNORMAL HIGH (ref 70–99)
Glucose-Capillary: 169 mg/dL — ABNORMAL HIGH (ref 70–99)
Glucose-Capillary: 184 mg/dL — ABNORMAL HIGH (ref 70–99)
Glucose-Capillary: 148 mg/dL — ABNORMAL HIGH (ref 70–99)
Glucose-Capillary: 188 mg/dL — ABNORMAL HIGH (ref 70–99)

## 2021-06-27 LAB — VALPROIC ACID LEVEL: Valproic Acid Lvl: 77 ug/mL (ref 50.0–100.0)

## 2021-06-27 NOTE — Progress Notes (Signed)
STROKE TEAM PROGRESS NOTE   SUBJECTIVE (INTERVAL HISTORY) No family at the bedside. Pt remains intubated, for respiratory failure unable to be weaned.of ventilatory support.  Of sedation, still not responsive, poor brainstem reflexes.  Trace withdrawal in left upper and lower extremity.  LTM EEG now discontinued.  Depakote level 77 .Neurological exam is unchanged.  Vital signs stable.  I spoke to son yesterday about poor prognosis and he will talk to other family members and is agreeable to talk to palliative care  OBJECTIVE Temp:  [97.8 F (36.6 C)-99.2 F (37.3 C)] 98.4 F (36.9 C) (12/20 1138) Pulse Rate:  [60-73] 69 (12/20 1200) Cardiac Rhythm: Normal sinus rhythm (12/20 0800) Resp:  [15-28] 18 (12/20 1200) BP: (113-219)/(53-121) 158/67 (12/20 1200) SpO2:  [96 %-100 %] 99 % (12/20 1200) FiO2 (%):  [30 %] 30 % (12/20 1200)  Recent Labs  Lab 06/26/21 1922 06/26/21 2329 06/27/21 0305 06/27/21 0755 06/27/21 1136  GLUCAP 169* 184* 166* 191* 169*   Recent Labs  Lab 06/21/21 0347 06/22/21 0437 06/23/21 0412 06/24/21 0250 06/25/21 0410 06/26/21 0410 06/27/21 0659  NA 147* 146* 143 148* 150* 145 140  K 4.3 3.7 3.3* 3.6 3.3* 3.5 3.4*  CL 117* 114* 115* 117* 113* 110 104  CO2 23 22 22 25 31 28  32  GLUCOSE 153* 285* 305* 209* 140* 194* 217*  BUN 18 16 22 16 16 17 22   CREATININE 0.94 0.81 0.90 0.60 0.56 0.47 0.53  CALCIUM 9.3 8.2* 7.3* 7.6* 8.1* 8.0* 8.1*  MG 1.9 1.5*  --   --   --   --   --   PHOS 1.7* 3.9  --   --   --   --   --    No results for input(s): AST, ALT, ALKPHOS, BILITOT, PROT, ALBUMIN in the last 168 hours.  Recent Labs  Lab 06/21/21 0347 06/22/21 0437 06/23/21 0412 06/26/21 0410 06/27/21 0659  WBC 19.0* 14.2* 16.7* 12.8* 12.0*  NEUTROABS  --   --   --   --  9.7*  HGB 12.3 9.8* 9.1* 9.9* 10.6*  HCT 39.9 31.8* 29.7* 32.3* 34.2*  MCV 75.1* 75.0* 74.6* 73.7* 74.0*  PLT 249 176 170 188 190   No results for input(s): CKTOTAL, CKMB, CKMBINDEX, TROPONINI  in the last 168 hours. No results for input(s): LABPROT, INR in the last 72 hours.  No results for input(s): COLORURINE, LABSPEC, [place], GLUCOSEU, HGBUR, BILIRUBINUR, KETONESUR, PROTEINUR, UROBILINOGEN, NITRITE, LEUKOCYTESUR in the last 72 hours.  Invalid input(s): APPERANCEUR      Component Value Date/Time   CHOL 94 06/21/2021 1114   TRIG 26 06/24/2021 0250   HDL 61 06/21/2021 1114   CHOLHDL 1.5 06/21/2021 1114   VLDL 8 06/21/2021 1114   LDLCALC 25 06/21/2021 1114   Lab Results  Component Value Date   HGBA1C 8.0 (H) 06/12/2021   No results found for: LABOPIA, COCAINSCRNUR, LABBENZ, AMPHETMU, THCU, LABBARB  No results for input(s): ETH in the last 168 hours.   I have personally reviewed the radiological images below and agree with the radiology interpretations.  CT ANGIO HEAD NECK W WO CM  Result Date: 06/21/2021 CLINICAL DATA:  Stroke/TIA, determine embolic source EXAM: CT ANGIOGRAPHY HEAD AND NECK TECHNIQUE: Multidetector CT imaging of the head and neck was performed using the standard protocol during bolus administration of intravenous contrast. Multiplanar CT image reconstructions and MIPs were obtained to evaluate the vascular anatomy. Carotid stenosis measurements (when applicable) are obtained utilizing NASCET criteria, using the  distal internal carotid diameter as the denominator. CONTRAST:  73mL OMNIPAQUE IOHEXOL 350 MG/ML SOLN COMPARISON:  CT and MRI brain 06/19/2021; MRA head 2015 FINDINGS: CT HEAD Brain: There is no acute intracranial hemorrhage. There is new loss of gray-white differentiation corresponding to some of the areas of infarction seen on the MRI, particularly in the right hemisphere. There is no mass effect. No hydrocephalus. Stable chronic infarcts and chronic microvascular ischemic changes. Vascular: No new finding. Skull: Calvarium is unremarkable. Sinuses/Orbits: No acute finding. Other: None. Review of the MIP images confirms the above findings CTA NECK  Aortic arch: Mild calcified plaque along the arch. Great vessel origins appear. Right carotid system: Patent.  No stenosis. Left carotid system: Patent. Plaque at the ICA origin causes less than 50% stenosis. Vertebral arteries: Patent.  Codominant.  No stenosis. Skeleton: Degenerative changes of the included spine. Other neck: Enlarged, heterogeneous thyroid previously evaluated by ultrasound. Endotracheal and enteric tubes are present Upper chest: Scarring with traction bronchiectasis. Review of the MIP images confirms the above findings CTA HEAD Anterior circulation: Intracranial internal carotid arteries are patent with calcified plaque causing mild stenosis. Anterior and middle cerebral arteries are patent. Atherosclerotic irregularity of distal branches. Posterior circulation: Intracranial vertebral arteries are patent. Basilar artery is patent. Major cerebellar artery origins are patent. Right posterior communicating artery is present. Posterior cerebral arteries are patent. Venous sinuses: Patent as allowed by contrast bolus timing. Review of the MIP images confirms the above findings IMPRESSION: No large vessel occlusion or hemodynamically significant stenosis in the neck. No proximal intracranial vessel occlusion. Atherosclerotic irregularity of medium to small vessel branches in the anterior circulation. Electronically Signed   By: Macy Mis M.D.   On: 06/21/2021 14:48   DG Abd 1 View  Result Date: 06/21/2021 CLINICAL DATA:  NG tube placement. EXAM: ABDOMEN - 1 VIEW COMPARISON:  CT scan 06/28/2021 FINDINGS: The bowel gas pattern is unremarkable. The NG tube tip is in the antropyloric region of the stomach. IMPRESSION: NG tube tip is in the antropyloric region of the stomach. Electronically Signed   By: Marijo Sanes M.D.   On: 06/21/2021 13:37   CT Head Wo Contrast  Result Date: 07/02/2021 CLINICAL DATA:  Level 1 trauma.  Found down. EXAM: CT HEAD WITHOUT CONTRAST CT MAXILLOFACIAL WITHOUT  CONTRAST CT CERVICAL SPINE WITHOUT CONTRAST TECHNIQUE: Multidetector CT imaging of the head, cervical spine, and maxillofacial structures were performed using the standard protocol without intravenous contrast. Multiplanar CT image reconstructions of the cervical spine and maxillofacial structures were also generated. COMPARISON:  None. FINDINGS: CT HEAD FINDINGS Brain: No evidence of acute infarction, hemorrhage, hydrocephalus, extra-axial collection or mass lesion/mass effect. 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 Vascular: No hyperdense vessel or unexpected calcification. Skull: No acute fracture CT MAXILLOFACIAL FINDINGS Osseous: No fracture or mandibular dislocation. No destructive process. Orbits: No evidence of injury Sinuses: Negative for hemosinus Soft tissues: No opaque foreign body or measurable hematoma. CT CERVICAL SPINE FINDINGS Alignment: No traumatic malalignment. Skull base and vertebrae: No acute fracture. No primary bone lesion or focal pathologic process. Soft tissues and spinal canal: No prevertebral fluid or swelling. No visible canal hematoma. Disc levels: Generalized disc collapse with endplate and facet spurring narrowing foramina diffusely, foraminal narrowing especially along the right-side of a scoliotic curvature. Upper chest: Reported separately IMPRESSION: No evidence of acute intracranial or cervical spine injury. Negative for facial fracture. Electronically Signed   By: Jorje Guild M.D.   On: 07/01/2021 11:07   CT Cervical  Spine Wo Contrast  Result Date: 07/05/2021 CLINICAL DATA:  Level 1 trauma.  Found down. EXAM: CT HEAD WITHOUT CONTRAST CT MAXILLOFACIAL WITHOUT CONTRAST CT CERVICAL SPINE WITHOUT CONTRAST TECHNIQUE: Multidetector CT imaging of the head, cervical spine, and maxillofacial structures were performed using the standard protocol without intravenous contrast. Multiplanar CT image reconstructions of the cervical spine and maxillofacial  structures were also generated. COMPARISON:  None. FINDINGS: CT HEAD FINDINGS Brain: No evidence of acute infarction, hemorrhage, hydrocephalus, extra-axial collection or mass lesion/mass effect. 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 Vascular: No hyperdense vessel or unexpected calcification. Skull: No acute fracture CT MAXILLOFACIAL FINDINGS Osseous: No fracture or mandibular dislocation. No destructive process. Orbits: No evidence of injury Sinuses: Negative for hemosinus Soft tissues: No opaque foreign body or measurable hematoma. CT CERVICAL SPINE FINDINGS Alignment: No traumatic malalignment. Skull base and vertebrae: No acute fracture. No primary bone lesion or focal pathologic process. Soft tissues and spinal canal: No prevertebral fluid or swelling. No visible canal hematoma. Disc levels: Generalized disc collapse with endplate and facet spurring narrowing foramina diffusely, foraminal narrowing especially along the right-side of a scoliotic curvature. Upper chest: Reported separately IMPRESSION: No evidence of acute intracranial or cervical spine injury. Negative for facial fracture. Electronically Signed   By: Jorje Guild M.D.   On: 06/14/2021 11:07   MR BRAIN WO CONTRAST  Result Date: 06/25/2021 CLINICAL DATA:  Neuro deficit, stroke suspected EXAM: MRI HEAD WITHOUT CONTRAST TECHNIQUE: Multiplanar, multiecho pulse sequences of the brain and surrounding structures were obtained without intravenous contrast. COMPARISON:  05/17/2019. FINDINGS: Brain: Areas of cortical restricted diffusion with ADC correlate in the right MCA territory (series 5, image 95, for example), with additional smaller areas of cortical restricted diffusion in the right PCA territory (series 5, image 76-7) and right ACA territory (series 5, image 97-99), with additional more punctate right MCA white matter infarcts (series 5, image 86 for example), and with additional scattered left frontal infarcts  (series 5, images 88 and 92). No significant gyral swelling. No acute hemorrhage, mass, mass effect, or midline shift. Confluent T2 hyperintense signal in the periventricular white matter and pons, likely the sequela of severe chronic small vessel ischemic disease. Remote left parietal infarct. Vascular: Normal flow voids. Skull and upper cervical spine: Choose 1 Sinuses/Orbits: Mucosal thickening in the right frontal sinus, right ethmoid air cells, right sphenoid sinus, and right maxillary sinus. Status post bilateral lens replacements. Other: None. IMPRESSION: Restricted diffusion in the right frontal parietal cortex, primarily in the right MCA territory, but also to a lesser extent in the right ACA, right PCA, and left ACA and MCA territory. This is overall concerning for an embolic etiology. No evidence of hemorrhagic conversion, mass effect, or midline shift. These results will be called to the ordering clinician or representative by the Radiologist Assistant, and communication documented in the PACS or [employer]. Electronically Signed   By: Merilyn Baba M.D.   On: 06/22/2021 23:39   DG Pelvis Portable  Result Date: 06/19/2021 CLINICAL DATA:  Trauma EXAM: PORTABLE PELVIS 1-2 VIEWS COMPARISON:  None. FINDINGS: Patient is rotated to the left which somewhat evaluation. There is no evidence of pelvic fracture or diastasis. No pelvic bone lesions are seen. Limited evaluation of the sacrum due to overlying bowel gas. IMPRESSION: No acute osseous abnormality. Electronically Signed   By: Yetta Glassman M.D.   On: 07/05/2021 10:46   DG Chest Port 1 View  Result Date: 06/23/2021 CLINICAL DATA:  Respiratory failure EXAM: PORTABLE  CHEST 1 VIEW COMPARISON:  Chest radiograph 06/21/2021 FINDINGS: The endotracheal tube tip is approximately 3.4 cm from the carina. The left sided vascular catheter terminates at the cavoatrial junction. The enteric catheter tip projects over the region of the pylorus. The  cardiomediastinal silhouette is stable. There are likely small bilateral pleural effusions with adjacent subsegmental atelectasis. Otherwise, there is no new focal consolidation. There is no pulmonary edema. There is no appreciable pneumothorax. Chain sutures in the left upper lobe are unchanged. IMPRESSION: Small bilateral pleural effusions with adjacent atelectasis, increased in conspicuity since 06/21/2021. Electronically Signed   By: Valetta Mole M.D.   On: 06/23/2021 09:22   DG CHEST PORT 1 VIEW  Result Date: 06/21/2021 CLINICAL DATA:  Shortness of breath EXAM: PORTABLE CHEST 1 VIEW COMPARISON:  Previous studies including the examination of 07/01/2021 FINDINGS: Apparent shift of mediastinum to the left may be due to rotation. Transverse diameter of heart is within normal limits. Thoracic aorta is tortuous and ectatic. Tip of endotracheal tube is 4.1 cm above the carina. Tip of left jugular central venous catheter is seen at the junction of superior vena cava and right atrium. Tip of enteric tube is seen in the stomach. Side-port in the enteric tube is in the lower thoracic esophagus close to gastroesophageal junction. Linear densities in the right upper lung fields may suggest scarring or subsegmental atelectasis. There is no new focal pulmonary consolidation. There is no pleural effusion or pneumothorax. IMPRESSION: There are no new infiltrates or signs of pulmonary edema. Side-port in enteric tube is seen in the lower thoracic esophagus close to gastroesophageal junction. Enteric tube could be advanced 5-10 cm to place the side port within the stomach. Electronically Signed   By: Elmer Picker M.D.   On: 06/21/2021 08:08   DG Chest Portable 1 View  Result Date: 06/21/2021 CLINICAL DATA:  Central line placement EXAM: PORTABLE CHEST 1 VIEW COMPARISON:  Previous studies including examination done earlier today. FINDINGS: Cardiac size is within normal limits. Thoracic aorta is tortuous and  ectatic. Tip of endotracheal tube is approximately 3.6 cm above the carina. Enteric tube is noted traversing the esophagus with its side port at the gastroesophageal junction. There is interval placement of left internal jugular central venous catheter with its tip in the region of junction of superior vena cava and right atrium. Linear density is seen in the right upper lung fields and left mid lung fields suggesting with no significant interval change suggesting scarring or subsegmental atelectasis. There is no new focal consolidation. There are no signs of alveolar pulmonary edema. IMPRESSION: There are no new infiltrates or signs of pulmonary edema. Linear densities in the right upper lobe and left mid lung fields may suggest scarring or subsegmental atelectasis. Other findings as described in the body of the report. Electronically Signed   By: Elmer Picker M.D.   On: 07/03/2021 12:59   DG Chest Port 1 View  Result Date: 06/23/2021 CLINICAL DATA:  Trauma EXAM: PORTABLE CHEST 1 VIEW COMPARISON:  Chest x-ray dated November 02, 2020 FINDINGS: ET tube tip projects approximately 1.0 cm from the carina. Enteric tube partially seen coursing below diaphragm. Evaluation is limited due to left port patient rotation. Bilateral upper lung linear opacities are unchanged compared to prior exam likely due to scarring or atelectasis. No focal consolidation. No large pleural effusion or evidence of pneumothorax. Limited evaluation of cardiac and mediastinal contours due to patient rotation. IMPRESSION: No acute cardiopulmonary no acute cardiopulmonary abnormality, evaluation somewhat limited  due to patient rotation. Electronically Signed   By: Allegra Lai M.D.   On: 06/30/2021 10:44   EEG adult  Result Date: 2021/06/30 Charlsie Quest, MD     Jun 30, 2021  2:29 PM Patient Name: LELANIA BIA MRN: [id number] Epilepsy Attending: Charlsie Quest Referring Physician/Provider: Dr Levon Hedger Date: 2021/06/30  Duration: 24.16 mins Patient history: 87yo F with ams. EEG to evaluate for seizure Level of alertness: comatose AEDs during EEG study: Propofol Technical aspects: This EEG study was done with scalp electrodes positioned according to the 10-20 International system of electrode placement. Electrical activity was acquired at a sampling rate of 500Hz  and reviewed with a high frequency filter of 70Hz  and a low frequency filter of 1Hz . EEG data were recorded continuously and digitally stored. Description: EEG showed continuous low amplitude 2-3hz  delta slowing in right hemisphere. There was also predominantly 5-8Hz  theta-alpha activity in left hemisphere admixed with intermittent 2-3hz  delta slowing. Hyperventilation and photic stimulation were not performed.   ABNORMALITY - Continuous slow, generalized and lateralized right hemisphere IMPRESSION: This study is suggestive of cortical dysfunction arising from right hemisphere, nonspecific etiology but likely secondary to underlying structural abnormality, post-ictal state. Additionally there is moderate diffuse encephalopathy, nonspecific etiology but likely could be secondary to sedation. No seizures or epileptiform discharges were seen throughout the recording. Dr was notified. Priyanka   Overnight EEG with video  Result Date: 06/22/2021 Wilford Corner, MD     06/23/2021  9:24 AM Patient Name: CONSEPCION UTT MRN: Charlsie Quest Epilepsy Attending: 06/25/2021 Referring Physician/Provider: Dr Jonelle Sidle Duration: 06/21/2021 1445 to 06/22/2021 1445  Patient history: 87yo F with ams. EEG to evaluate for seizure  Level of alertness: comatose  AEDs during EEG study: Propofol, LEV,VPA  Technical aspects: This EEG study was done with scalp electrodes positioned according to the 10-20 International system of electrode placement. Electrical activity was acquired at a sampling rate of 500Hz  and reviewed with a high frequency filter of 70Hz  and a low frequency  filter of 1Hz . EEG data were recorded continuously and digitally stored.  Description: EEG initially showed continuous low amplitude 2-3hz  delta slowing in right hemisphere. There was also predominantly 5-8Hz  theta-alpha activity in left hemisphere admixed with intermittent 2-3hz  delta slowing.  After around 2330 on 06/21/2021, as medications were adjusted, EEG was suggestive of burst attenuation pattern with bursts of low amplitude polymorphic asynchronous 2 to 3 Hz delta slowing with overriding 15 to 18 Hz beta activity lasting 2 to 3 seconds alternating with 5 to 8 seconds of generalized EEG attenuation. Patient event button was pressed on 06/22/2021 at 2001, 2016, 2216, 2038, 2253, 2309 and 2315 for left side neck jerking and possible left arm jerking ( covered under blanket).  Concomitant EEG initially showed sharply contoured 6 to 8 Hz theta-alpha activity in the right frontal temporal region which gradually evolved into 2 to 3 Hz delta slowing. Hyperventilation and photic stimulation were not performed.    ABNORMALITY - Focal seizure, right frontotemporal region  - Continuous slow, generalized and lateralized right hemisphere - Burst attenuation, generalized  IMPRESSION: This study showed focal seizures arising from right frontotemporal region on 06/22/2021 at 2001, 2016, 2216, 2038, 2253, 2309 and 2315, lasting about 2 minute each.  During the seizure patient was noted to have left-sided neck jerking and possible left arm jerking ( covered under blanket). EEG was also suggestive of cortical dysfunction arising from right hemisphere, nonspecific etiology but likely secondary to underlying  structural abnormality, post-ictal state.  Additionally EEG was initially suggestive of moderate diffuse encephalopathy.  After around 2330 on 06/21/2021, as medications were adjusted, EEG was suggestive of profound diffuse encephalopathy, nonspecific etiology but likely could be secondary to sedation.  Lora Havens    ECHOCARDIOGRAM COMPLETE  Result Date: 06/23/2021    ECHOCARDIOGRAM REPORT   Patient Name:   BERNITA COOLIDGE [facility] Date of Exam: 06/23/2021 Medical Rec #:  [id number]     Height:       62.0 in Accession #:    [id number]    Weight:       117.7 lb Date of Birth:  07/07/1932     BSA:          1.526 m Patient Age:    34 years      BP:           149/58 mmHg Patient Gender: F             HR:           80 bpm. Exam Location:  Inpatient Procedure: 2D Echo, 3D Echo, Cardiac Doppler, Color Doppler and Strain Analysis Indications:    Stroke I63.9  History:        Patient has no prior history of Echocardiogram examinations.                 Signs/Symptoms:Fever; Risk Factors:Diabetes and Hypertension.  Sonographer:    Darlina Sicilian RDCS Referring Phys: [id number] [facility]  Sonographer Comments: Echo performed with patient supine and on artificial respirator. IMPRESSIONS  1. Left ventricular ejection fraction, by estimation, is 65 to 70%. The left ventricle has normal function. The left ventricle has no regional wall motion abnormalities. There is mild left ventricular hypertrophy of the basal segment. Left ventricular diastolic parameters are consistent with Grade I diastolic dysfunction (impaired relaxation).  2. Right ventricular systolic function is normal. The right ventricular size is normal. Tricuspid regurgitation signal is inadequate for assessing PA pressure.  3. The mitral valve is grossly normal. No evidence of mitral valve regurgitation.  4. The aortic valve is grossly normal. Aortic valve regurgitation is not visualized.  5. The inferior vena cava is normal in size with greater than 50% respiratory variability, suggesting right atrial pressure of 3 mmHg. Comparison(s): No prior Echocardiogram. Conclusion(s)/Recommendation(s): Normal biventricular function without evidence of hemodynamically significant valvular heart disease. FINDINGS  Left Ventricle: Left ventricular ejection fraction, by estimation, is 65 to 70%. The  left ventricle has normal function. The left ventricle has no regional wall motion abnormalities. Global longitudinal strain performed but not reported based on interpreter judgement due to suboptimal tracking. The left ventricular internal cavity size was normal in size. There is mild left ventricular hypertrophy of the basal segment. Left ventricular diastolic parameters are consistent with Grade I diastolic dysfunction (impaired relaxation). Right Ventricle: The right ventricular size is normal. No increase in right ventricular wall thickness. Right ventricular systolic function is normal. Tricuspid regurgitation signal is inadequate for assessing PA pressure. Left Atrium: Left atrial size was normal in size. Right Atrium: Right atrial size was normal in size. Pericardium: There is no evidence of pericardial effusion. Mitral Valve: The mitral valve is grossly normal. No evidence of mitral valve regurgitation. Tricuspid Valve: The tricuspid valve is grossly normal. Tricuspid valve regurgitation is not demonstrated. Aortic Valve: Focal calcification. The aortic valve is grossly normal. Aortic valve regurgitation is not visualized. Pulmonic Valve: The pulmonic valve was grossly normal. Pulmonic valve regurgitation is not visualized. Aorta:  The aortic root and ascending aorta are structurally normal, with no evidence of dilitation. Venous: The inferior vena cava is normal in size with greater than 50% respiratory variability, suggesting right atrial pressure of 3 mmHg. IAS/Shunts: No atrial level shunt detected by color flow Doppler.  LEFT VENTRICLE PLAX 2D LVIDd:         3.60 cm   Diastology LVIDs:         2.00 cm   LV e' medial:    5.88 cm/s LV PW:         1.00 cm   LV E/e' medial:  16.1 LV IVS:        1.50 cm   LV e' lateral:   6.20 cm/s LVOT diam:     1.70 cm   LV E/e' lateral: 15.3 LV SV:         57 LV SV Index:   37 LVOT Area:     2.27 cm                           3D Volume EF:                          3D EF:         71 %                          LV EDV:       68 ml                          LV ESV:       20 ml                          LV SV:        49 ml RIGHT VENTRICLE RV S prime:     11.60 cm/s TAPSE (M-mode): 2.4 cm LEFT ATRIUM             Index        RIGHT ATRIUM          Index LA diam:        3.10 cm 2.03 cm/m   RA Area:     6.94 cm LA Vol (A2C):   20.5 ml 13.43 ml/m  RA Volume:   11.80 ml 7.73 ml/m LA Vol (A4C):   36.5 ml 23.92 ml/m LA Biplane Vol: 27.7 ml 18.15 ml/m  AORTIC VALVE LVOT Vmax:   115.00 cm/s LVOT Vmean:  82.800 cm/s LVOT VTI:    0.252 m  AORTA Ao Root diam: 2.80 cm Ao Asc diam:  3.00 cm MITRAL VALVE MV Area (PHT): 3.65 cm     SHUNTS MV Decel Time: 208 msec     Systemic VTI:  0.25 m MV E velocity: 94.70 cm/s   Systemic Diam: 1.70 cm MV A velocity: 110.00 cm/s MV E/A ratio:  0.86 [place] signed by Carolan Clines Signature Date/Time: 06/23/2021/2:28:46 PM    Final    VAS [place] LOWER EXTREMITY VENOUS (DVT)  Result Date: 06/22/2021  Lower Venous DVT Study Patient Name:  KAYLENE SHADDOCK [facility]  Date of Exam:   06/22/2021 Medical Rec #: [id number]      Accession #:    [id number] Date of Birth: [birth year]      Patient Gender: F Patient Age:  89 years Exam Location:  [facility] Procedure:      VAS [place] LOWER EXTREMITY VENOUS (DVT) Referring Phys: Cornelius Moras XU --------------------------------------------------------------------------------  Indications: Stroke.  Comparison Study: no prior Performing Technologist: Archie Patten RVS  Examination Guidelines: A complete evaluation includes B-mode imaging, spectral Doppler, color Doppler, and power Doppler as needed of all accessible portions of each vessel. Bilateral testing is considered an integral part of a complete examination. Limited examinations for reoccurring indications may be performed as noted. The reflux portion of the exam is performed with the patient in reverse Trendelenburg.   +---------+---------------+---------+-----------+----------+--------------+  RIGHT     Compressibility Phasicity Spontaneity Properties Thrombus Aging  +---------+---------------+---------+-----------+----------+--------------+  CFV       Full            Yes       Yes                                    +---------+---------------+---------+-----------+----------+--------------+  SFJ       Full                                                             +---------+---------------+---------+-----------+----------+--------------+  FV Prox   Full                                                             +---------+---------------+---------+-----------+----------+--------------+  FV Mid    Full            Yes       Yes                                    +---------+---------------+---------+-----------+----------+--------------+  FV Distal Full            Yes       Yes                                    +---------+---------------+---------+-----------+----------+--------------+  PFV       Full                                                             +---------+---------------+---------+-----------+----------+--------------+  POP       Full            Yes       Yes                                    +---------+---------------+---------+-----------+----------+--------------+  PTV       Full                                                             +---------+---------------+---------+-----------+----------+--------------+  PERO      Full                                                             +---------+---------------+---------+-----------+----------+--------------+   +---------+---------------+---------+-----------+----------+--------------+  LEFT      Compressibility Phasicity Spontaneity Properties Thrombus Aging  +---------+---------------+---------+-----------+----------+--------------+  CFV       Full            Yes       Yes                                     +---------+---------------+---------+-----------+----------+--------------+  SFJ       Full                                                             +---------+---------------+---------+-----------+----------+--------------+  FV Prox   Full                                                             +---------+---------------+---------+-----------+----------+--------------+  FV Mid    Full                                                             +---------+---------------+---------+-----------+----------+--------------+  FV Distal                 Yes       Yes                                    +---------+---------------+---------+-----------+----------+--------------+  PFV       Full                                                             +---------+---------------+---------+-----------+----------+--------------+  POP       Full            Yes       Yes                                    +---------+---------------+---------+-----------+----------+--------------+  PTV       Full                                                             +---------+---------------+---------+-----------+----------+--------------+  PERO      Full                                                             +---------+---------------+---------+-----------+----------+--------------+     Summary: BILATERAL: - No evidence of deep vein thrombosis seen in the lower extremities, bilaterally. -No evidence of popliteal cyst, bilaterally.   *See table(s) above for measurements and observations. Electronically signed by Monica Martinez MD on 06/22/2021 at 7:48:31 PM.    Final    CT CHEST ABDOMEN PELVIS WO CONTRAST  Result Date: 06/27/2021 CLINICAL DATA:  Level 1 trauma. Fall. EXAM: CT CHEST, ABDOMEN AND PELVIS WITHOUT CONTRAST TECHNIQUE: Multidetector CT imaging of the chest, abdomen and pelvis was performed following the standard protocol without IV contrast. COMPARISON:  None similar FINDINGS: CT CHEST FINDINGS Cardiovascular:  Normal heart size. Trace anterior pericardial effusion which appears low-density. Coronary atherosclerosis. Mediastinum/Nodes: Endotracheal and enteric tubes are in unremarkable position. No hematoma or pneumomediastinum. 3.2 cm left thyroid nodule, unchanged from 2020. This was evaluated by ultrasound in 2019. No follow-up recommended unless clinically warranted (ref: J Am Coll Radiol. 2015 Feb;12(2): 143-50). Lungs/Pleura: Bands of scarring in the upper lungs with mild, cylindrical left upper lobe bronchiectasis. Prior left apical surgery with lung sutures. No hemothorax, pneumothorax, or lung contusion. Musculoskeletal: Scoliosis.  No acute fracture or subluxation. CT ABDOMEN PELVIS FINDINGS Hepatobiliary: No hepatic injury or perihepatic hematoma. Gallbladder is unremarkable Pancreas: Unremarkable Spleen: No splenic injury or perisplenic hematoma. Adrenals/Urinary Tract: No adrenal hemorrhage or renal injury identified. Bladder is unremarkable. Bilateral nephrolithiasis. Adjacent or lobulated right lower pole calculus in total measuring 9 mm. 3 mm interpolar left renal calculus. Stomach/Bowel: No evidence of injury. Rectal stool distention. The enteric tube tip is at the stomach. Vascular/Lymphatic: Stranding around the right femoral vessels related to phlebotomy per report. Atheromatous calcification. Reproductive: Hysterectomy Other: No ascites or pneumoperitoneum Musculoskeletal: Scoliosis and ordinary degeneration for age. No acute fracture or subluxation. IMPRESSION: No evidence of acute intrathoracic or intra-abdominal injury. Electronically Signed   By: Jorje Guild M.D.   On: 06/19/2021 11:05   CT Maxillofacial Wo Contrast  Result Date: 06/08/2021 CLINICAL DATA:  Level 1 trauma.  Found down. EXAM: CT HEAD WITHOUT CONTRAST CT MAXILLOFACIAL WITHOUT CONTRAST CT CERVICAL SPINE WITHOUT CONTRAST TECHNIQUE: Multidetector CT imaging of the head, cervical spine, and maxillofacial structures were performed  using the standard protocol without intravenous contrast. Multiplanar CT image reconstructions of the cervical spine and maxillofacial structures were also generated. COMPARISON:  None. FINDINGS: CT HEAD FINDINGS Brain: No evidence of acute infarction, hemorrhage, hydrocephalus, extra-axial collection or mass lesion/mass effect. 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 Vascular: No hyperdense vessel or unexpected calcification. Skull: No acute fracture CT MAXILLOFACIAL FINDINGS Osseous: No fracture or mandibular dislocation. No destructive process. Orbits: No evidence of injury Sinuses: Negative for hemosinus Soft tissues: No opaque foreign body or measurable hematoma. CT CERVICAL SPINE FINDINGS Alignment: No traumatic malalignment. Skull base and vertebrae: No acute fracture. No primary bone lesion or focal pathologic process. Soft tissues and spinal canal: No prevertebral fluid or swelling. No visible canal hematoma. Disc levels: Generalized disc collapse with endplate and facet spurring narrowing foramina diffusely, foraminal narrowing especially along the right-side of a scoliotic curvature. Upper chest: Reported separately IMPRESSION: No evidence of acute intracranial or  cervical spine injury. Negative for facial fracture. Electronically Signed   By: Jorje Guild M.D.   On: 06/18/2021 11:07     PHYSICAL EXAM  Temp:  [97.8 F (36.6 C)-99.2 F (37.3 C)] 98.4 F (36.9 C) (12/20 1138) Pulse Rate:  [60-73] 69 (12/20 1200) Resp:  [15-28] 18 (12/20 1200) BP: (113-219)/(53-121) 158/67 (12/20 1200) SpO2:  [96 %-100 %] 99 % (12/20 1200) FiO2 (%):  [30 %] 30 % (12/20 1200)  General - Well nourished, well developed elderly lady, intubated off sedation.  Ophthalmologic - fundi not visualized due to noncooperation.  Cardiovascular - Regular rate and rhythm.  Neuro - intubated  off sedation but remains unresponsive, eyes closed, not following commands. With forced eye opening,  eyes in mid position, not blinking to visual threat, doll's eyes absent, not tracking, right pupil 1.29mm but reactive, left pupil pinpoint but irregular shape with evidence of prior ophthalmic surgery. Corneal reflex absent bilaterally, gag and cough present. Breathing over the vent.  Facial symmetry not able to test due to ET tube.  Tongue protrusion not cooperative. On pain stimulation, no significant movement of all extremities except trace withdrawal in the left greater than right lower extremity.. No babinski. Sensation, coordination and gait not tested.   ASSESSMENT/PLAN Ms. SUDHA MALANEY is a 87 y.o. female with history of cerebral palsy, hypertension, diabetes, sarcoidosis admitted for #home with facial trauma, altered mental status. No tPA given due to unknown last known well.    Stroke:  right MCA and ACA infarcts embolic pattern secondary to unclear source, concerning for occult A. fib CT no acute abnormality MRI right MCA, MCA/PCA and MCA/ACA scattered infarcts CT head and neck unremarkable 2D Echo ejection fraction 65 to 70%. LE venous Doppler no DVT LDL 25 HgbA1c 8.0 Heparin subcu for VTE prophylaxis aspirin 81 mg daily prior to admission, now on aspirin 325 mg daily given potential procedures (trach or PEG) Ongoing aggressive stroke risk factor management Therapy recommendations: Pending Disposition: Pending, agree with CCM, if pt not make much progress over the weekend, will consider palliative care involvement.   Seizure Reported right facial twitching and right arm jerking movement Received Versed and propofol bolus EEG 12/13 cortical dysfunction arising from right hemisphere, nonspecific etiology  LTM EEG showed focal seizures arising from right frontotemporal region lasting about 2 minute each.  During the seizure patient was noted to have left-sided neck jerking and possible left arm jerking  Keppra load followed by 500 bid->1500 bid Depakote load x 2, increased to 750  milligrams Q6 Depakote level 33 -> 55. >62 Now off sedation, few brief silent seizures continue on long-term EEG monitoring Fever Leukocytosis  Spiking fever 12/13, T-max 100.9->afebrile Today afebrile WBC 17.8-19.0-14.2-16.7 UA WBC 20-51 CXR unremarkable Blood culture NGTD On cefepime -> zosyn  Diabetes, uncontrolled HgbA1c 8.0 goal < 7.0 Uncontrolled Currently on Levemir 5->10->15 U twice daily CBG monitoring SSI DM education and close PCP follow up  Respiratory failure Intubated on sedation CCM on board Not candidate for extubation today  Hypertension Stable Long term BP goal normotensive  Other Stroke Risk Factors Advanced age  Other Active Problems Cerebral palsy Sarcoidosis  Hospital day # 7 Patient neurological exam continues to remain quite poor and patient does not wake up despite being off sedation and not having seizures making prognosis quite poor.  We need to have goals of care discussion with family when available.  Discussed with Dr. Tacy Learn critical care medicine.   This patient is critically ill due  to right MCA and ACA infarcts, seizure, leukocytosis, respiratory failure, dysphagia and at significant risk of neurological worsening, death form recurrent stroke, status epilepticus, sepsis, heart failure. This patient's care requires constant monitoring of vital signs, hemodynamics, respiratory and cardiac monitoring, review of multiple databases, neurological assessment, discussion with family, other specialists and medical decision making of high complexity. I spent 30 minutes of neurocritical care time in the care of this patient.   Antony Contras, MD Stroke Neurology 06/27/2021 2:41 PM    To contact Stroke Continuity provider, please refer to [email or web address] After hours, contact General Neurology

## 2021-06-27 NOTE — Progress Notes (Signed)
NAME:  Julia [name], MRN:  [id number], DOB:  11-21-1931, LOS: 7 ADMISSION DATE:  06-24-2021, CONSULTATION DATE:  2021/06/24 REFERRING MD:  Dr. Jeraldine Loots, ER, CHIEF COMPLAINT:  AMS   History of Present Illness:  87 yo female found unresponsive at home.  Required intubation for airway protection.  Found to have leukocytosis, lactic acidosis, hyperglycemia, hypernatremia, HTN (BP 229/119).  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  Pertinent  Medical History  GERD, Esophageal stricture, UTI, Rhabdo, CP, Lt eye aphakia, Cataracts, CVA, HTN, DM type 2  Significant Hospital Events: Including procedures, antibiotic start and stop dates in addition to other pertinent events   12/13 Admit, neurology and trauma consulted. CT head 12/13 >> no acute infarction/hemorrhage/hydrocephalus; remote Lt parieto-occipital infarct. EEG 12/13 >> generalized slowing and Rt hemisphere lateralization.MRI brain 12/13 >> Restricted diffusion in Rt frontal parietal cortex primarily in Rt MCA territory but also Rt ACA/PCA and Lt ACA/MCA territories; concerning for embolic CVA 12/14 new onset seizure, start LTM. CT angio head/neck 12/14 >> no large vessel occlusions EG 12/15 >> focal seizure, Rt frontotemporal region; generalized burst suppression; generalized slowing 12/16 - EEG slow, 2 short seizures in PM, R sided eplileptogenic R hemisphere, comatose 12/16 2 short seizures 12/17 still having seizures. AEDs increased 12/17 comatose off sedation > 24 hours. 2 seizures on EEG 12/18 eeg: having 1-2 seizures/hr without clinical signs, lasting approx 45 sec each  12/19: Palliative care consulted. Son made aware devastating neurological injury by both Neuro and critical care team. LTM findings:  "cortical dysfunction arising from right hemisphere, maximal right posterior quadrant likely secondary to underlying structural abnormality. Additionally there is severe diffuse encephalopathy, nonspecific etiology.  No seizures were seen during the  study". LTM discontinued.    Interim History / Subjective:  No sig change   Objective   Blood pressure (Abnormal) 163/66, pulse 64, temperature 99.2 F (37.3 C), temperature source Oral, resp. rate 16, height 5\' 2"  (1.575 m), weight 55.7 kg, SpO2 99 %.    Vent Mode: PRVC FiO2 (%):  [30 %-40 %] 30 % Set Rate:  [16 bmp] 16 bmp Vt Set:  [400 mL] 400 mL PEEP:  [5 cmH20] 5 cmH20 Plateau Pressure:  [16 cmH20-18 cmH20] 17 cmH20   Intake/Output Summary (Last 24 hours) at 06/27/2021 0731 Last data filed at 06/27/2021 0400 Gross per 24 hour  Intake 2942.15 ml  Output 2080 ml  Net 862.15 ml   Filed Weights   06/22/21 0441 06/24/21 0350 06/25/21 0406  Weight: 53.4 kg 57.6 kg 55.7 kg    Examination:  General 87 year old female who remains comatose after devastating CVA HENT NCAT has chronic left pupillary defect. Orally intubated Pulm scattered rhonchi. Some thick clear secretions from ETT. Tolerating PSV 10  Card RRR Abd soft Ext warm  Neuro no response to painful stim. + cough Right pupil reactive GU cl yellow   Resolved Hospital Problem list   Hyperkalemia, Lactic acidosis, Hypokalemia, hypertensive emergency Recurrent UTI- s/p 5d abx  Assessment & Plan:   Acute metabolic encephalopathy (remains Comatose) s/p Acute embolic CVA c/b New onset seizures (controlled as of 12/19) Plan Supportive care  RASS goal 0 Cont AEDs GOC meeting pending    Acute hypoxic respiratory failure in setting of above  Aspiration pneumonitis (POA) Plan Cont vent support VAP bundle  DM2 with hyperglycemia -Current glycemic control acceptable Plan Cont mod SSI Levemir at 15 units bid   Anemia of critical illness  -Hemoglobin stable Plan Intermittent CBC Trigger for <  7   GOC -Son made aware of poor prognosis. Palliative consulted (pending)  Plan Awaiting family to set up time    Best Practice (right click and "Reselect all SmartList Selections" daily)   Diet/type:  tubefeeds DVT prophylaxis: prophylactic heparin  GI prophylaxis: PPI Lines: Central line Foley:  N/A Code Status:  full code Last date of multidisciplinary goals of care discussion [will attempt to discuss with family 12/17, grave prognosis off sedation and comatose]  Critical care time: 31 min    CRITICAL CARE Performed by Simonne Martinet ACNP-BC [facility] Pulmonary/Critical Care Pager # [phone] OR # [phone] if no answer

## 2021-06-28 LAB — GLUCOSE, CAPILLARY
Glucose-Capillary: 136 mg/dL — ABNORMAL HIGH (ref 70–99)
Glucose-Capillary: 146 mg/dL — ABNORMAL HIGH (ref 70–99)
Glucose-Capillary: 170 mg/dL — ABNORMAL HIGH (ref 70–99)
Glucose-Capillary: 174 mg/dL — ABNORMAL HIGH (ref 70–99)
Glucose-Capillary: 180 mg/dL — ABNORMAL HIGH (ref 70–99)
Glucose-Capillary: 199 mg/dL — ABNORMAL HIGH (ref 70–99)

## 2021-06-28 LAB — BASIC METABOLIC PANEL
Anion gap: 7 (ref 5–15)
BUN: 19 mg/dL (ref 8–23)
CO2: 30 mmol/L (ref 22–32)
Calcium: 7.8 mg/dL — ABNORMAL LOW (ref 8.9–10.3)
Chloride: 102 mmol/L (ref 98–111)
Creatinine, Ser: 0.49 mg/dL (ref 0.44–1.00)
GFR, Estimated: >60 mL/min (ref >60)
Glucose, Bld: 187 mg/dL — ABNORMAL HIGH (ref 70–99)
Potassium: 3.3 mmol/L — ABNORMAL LOW (ref 3.5–5.1)
Sodium: 139 mmol/L (ref 135–145)

## 2021-06-28 LAB — VALPROIC ACID LEVEL: Valproic Acid Lvl: 81 ug/mL (ref 50.0–100.0)

## 2021-06-28 MED ORDER — FREE WATER
200.0000 mL | Freq: Four times a day (QID) | Status: DC
  Administered 2021-06-28 – 2021-06-29 (×3): 200 mL

## 2021-06-28 MED ORDER — POTASSIUM CHLORIDE 10 MEQ/50ML IV SOLN
10.0000 meq | INTRAVENOUS | Status: AC
  Administered 2021-06-28 (×4): 10 meq via INTRAVENOUS
  Filled 2021-06-28 (×4): qty 50

## 2021-06-28 MED ORDER — VITAL AF 1.2 CAL PO LIQD
1000.0000 mL | ORAL | Status: DC
  Administered 2021-06-28 – 2021-06-29 (×2): 1000 mL

## 2021-06-28 MED ORDER — POTASSIUM CHLORIDE 20 MEQ PO PACK
20.0000 meq | PACK | ORAL | Status: AC
  Administered 2021-06-28 (×2): 20 meq
  Filled 2021-06-28 (×2): qty 1

## 2021-06-28 NOTE — Progress Notes (Signed)
NAME:  Julia [name], MRN:  [id number], DOB:  1932/01/31, LOS: 8 ADMISSION DATE:  07/10/2021, CONSULTATION DATE:  07-10-21 REFERRING MD:  Dr. Jeraldine Loots, ER, CHIEF COMPLAINT:  AMS   History of Present Illness:  87 yo female found unresponsive at home.  Required intubation for airway protection.  Found to have leukocytosis, lactic acidosis, hyperglycemia, hypernatremia, HTN (BP 229/119).  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  Pertinent  Medical History  GERD, Esophageal stricture, UTI, Rhabdo, CP, Lt eye aphakia, Cataracts, CVA, HTN, DM type 2  Significant Hospital Events: Including procedures, antibiotic start and stop dates in addition to other pertinent events   12/13 Admit, neurology and trauma consulted. CT head 12/13 >> no acute infarction/hemorrhage/hydrocephalus; remote Lt parieto-occipital infarct. EEG 12/13 >> generalized slowing and Rt hemisphere lateralization.MRI brain 12/13 >> Restricted diffusion in Rt frontal parietal cortex primarily in Rt MCA territory but also Rt ACA/PCA and Lt ACA/MCA territories; concerning for embolic CVA 12/14 new onset seizure, start LTM. CT angio head/neck 12/14 >> no large vessel occlusions EG 12/15 >> focal seizure, Rt frontotemporal region; generalized burst suppression; generalized slowing 12/16 - EEG slow, 2 short seizures in PM, R sided eplileptogenic R hemisphere, comatose 12/16 2 short seizures 12/17 still having seizures. AEDs increased 12/17 comatose off sedation > 24 hours. 2 seizures on EEG 12/18 eeg: having 1-2 seizures/hr without clinical signs, lasting approx 45 sec each  12/19: Palliative care consulted. Son made aware devastating neurological injury by both Neuro and critical care team. LTM findings:  "cortical dysfunction arising from right hemisphere, maximal right posterior quadrant likely secondary to underlying structural abnormality. Additionally there is severe diffuse encephalopathy, nonspecific etiology.  No seizures were seen during the  study". LTM discontinued.    Interim History / Subjective:  No events overnight  Objective   Blood pressure (!) 169/74, pulse 75, temperature 99.1 F (37.3 C), temperature source Axillary, resp. rate 19, height 5\' 2"  (1.575 m), weight 55.7 kg, SpO2 99 %.    Vent Mode: PRVC FiO2 (%):  [30 %] 30 % Set Rate:  [16 bmp] 16 bmp Vt Set:  [400 mL] 400 mL PEEP:  [5 cmH20] 5 cmH20 Pressure Support:  [10 cmH20] 10 cmH20 Plateau Pressure:  [16 cmH20-17 cmH20] 17 cmH20   Intake/Output Summary (Last 24 hours) at 06/28/2021 0805 Last data filed at 06/28/2021 0600 Gross per 24 hour  Intake 2756.43 ml  Output 1875 ml  Net 881.43 ml   Filed Weights   06/22/21 0441 06/24/21 0350 06/25/21 0406  Weight: 53.4 kg 57.6 kg 55.7 kg    Examination: General:  elderly female intubated/ on mechanical vent in NAD HEENT: MM pink/moist, ETT/ OGT, right pupil 3/reactive, L irregular, + corneals Neuro:  unresponsive except for withdrawal to noxious stimuli in LLE, does not open eyes or f/c CV: NSR PULM:  non labored on full MV, coarse throughout, scant tan secretions, +cough, flipped to PSV 12/5 with still low TVs and increasing RR GI: soft, bs+, purwick Extremities: warm/dry, some upper extremity dependent edema, no tibial edema  UOP 1.8L / 24hrs  Net +9.7L   Labs reviewed 12/21:  K 3.3  Resolved Hospital Problem list   Hyperkalemia, Lactic acidosis, Hypokalemia, hypertensive emergency Recurrent UTI- s/p 5d abx  Assessment & Plan:   Acute metabolic encephalopathy (remains Comatose) s/p Acute embolic CVA c/b New onset seizures (controlled as of 12/19) Plan Ongoing supportive care Off sedation as of 12/16 without significant improvement- still not needing sedation, prognosis remains poor  Continue  AED per Neuro- keppra and VPA seizure precautions PMT consulted for GOC  Acute hypoxic respiratory failure in setting of above  Aspiration pneumonitis (POA), completed zosyn 12/17 for 5 days of  abx Plan Continue MV support, PRVC VAP prevention protocol/ PPI PAD protocol for sedation> not needed daily SAT & SBT- mental status and poor weaning efforts remain barrier to extubation  DM2 with hyperglycemia -Current glycemic control acceptable Plan Cont mod SSI Levemir at 15 units bid   Anemia of critical illness  -Hemoglobin stable Plan Intermittent CBC Trigger for < 7   GOC -Son made aware of poor prognosis. Palliative consulted (pending)   Best Practice (right click and "Reselect all SmartList Selections" daily)   Diet/type: tubefeeds DVT prophylaxis: prophylactic heparin  GI prophylaxis: PPI Lines: Central line Foley:  N/A Code Status:  full code Last date of multidisciplinary goals of care discussion [will attempt to discuss with family 12/17, grave prognosis off sedation and comatose]  Critical care time: 30 min     Posey Boyer, ACNP [place] [facility] 06/28/2021, 8:13 AM  See Amion for pager If no response to pager, please call PCCM consult pager After 7:00 pm call Elink

## 2021-06-28 NOTE — Progress Notes (Signed)
STROKE TEAM PROGRESS NOTE   SUBJECTIVE (INTERVAL HISTORY) No neurological changes.. Pt remains intubated, for respiratory failure unable to be weaned.of ventilatory support.  Of sedation, still not responsive, poor brainstem reflexes.  Trace withdrawal in left upper and lower extremity.    Vital signs stable.  Family did not keep appointment with palliative care team yesterday and hopefully will meet with them today  OBJECTIVE Temp:  [98.5 F (36.9 C)-99.3 F (37.4 C)] 98.8 F (37.1 C) (12/21 1143) Pulse Rate:  [67-81] 73 (12/21 1015) Cardiac Rhythm: Normal sinus rhythm (12/21 0800) Resp:  [15-26] 18 (12/21 1100) BP: (124-184)/(48-107) 142/63 (12/21 1000) SpO2:  [97 %-100 %] 100 % (12/21 1015) FiO2 (%):  [30 %] 30 % (12/21 1100)  Recent Labs  Lab 06/27/21 1956 06/27/21 2310 06/28/21 0346 06/28/21 0731 06/28/21 1142  GLUCAP 148* 188* 170* 136* 174*   Recent Labs  Lab 06/22/21 0437 06/23/21 0412 06/24/21 0250 06/25/21 0410 06/26/21 0410 06/27/21 0659 06/28/21 0257  NA 146*   < > 148* 150* 145 140 139  K 3.7   < > 3.6 3.3* 3.5 3.4* 3.3*  CL 114*   < > 117* 113* 110 104 102  CO2 22   < > 25 31 28  32 30  GLUCOSE 285*   < > 209* 140* 194* 217* 187*  BUN 16   < > 16 16 17 22 19   CREATININE 0.81   < > 0.60 0.56 0.47 0.53 0.49  CALCIUM 8.2*   < > 7.6* 8.1* 8.0* 8.1* 7.8*  MG 1.5*  --   --   --   --   --   --   PHOS 3.9  --   --   --   --   --   --    < > = values in this interval not displayed.   No results for input(s): AST, ALT, ALKPHOS, BILITOT, PROT, ALBUMIN in the last 168 hours.  Recent Labs  Lab 06/22/21 0437 06/23/21 0412 06/26/21 0410 06/27/21 0659  WBC 14.2* 16.7* 12.8* 12.0*  NEUTROABS  --   --   --  9.7*  HGB 9.8* 9.1* 9.9* 10.6*  HCT 31.8* 29.7* 32.3* 34.2*  MCV 75.0* 74.6* 73.7* 74.0*  PLT 176 170 188 190   No results for input(s): CKTOTAL, CKMB, CKMBINDEX, TROPONINI in the last 168 hours. No results for input(s): LABPROT, INR in the last 72  hours.  No results for input(s): COLORURINE, LABSPEC, [place], GLUCOSEU, HGBUR, BILIRUBINUR, KETONESUR, PROTEINUR, UROBILINOGEN, NITRITE, LEUKOCYTESUR in the last 72 hours.  Invalid input(s): APPERANCEUR      Component Value Date/Time   CHOL 94 06/21/2021 1114   TRIG 26 06/24/2021 0250   HDL 61 06/21/2021 1114   CHOLHDL 1.5 06/21/2021 1114   VLDL 8 06/21/2021 1114   LDLCALC 25 06/21/2021 1114   Lab Results  Component Value Date   HGBA1C 8.0 (H) 06/13/2021   No results found for: LABOPIA, COCAINSCRNUR, LABBENZ, AMPHETMU, THCU, LABBARB  No results for input(s): ETH in the last 168 hours.   I have personally reviewed the radiological images below and agree with the radiology interpretations.  CT ANGIO HEAD NECK W WO CM  Result Date: 06/21/2021 CLINICAL DATA:  Stroke/TIA, determine embolic source EXAM: CT ANGIOGRAPHY HEAD AND NECK TECHNIQUE: Multidetector CT imaging of the head and neck was performed using the standard protocol during bolus administration of intravenous contrast. Multiplanar CT image reconstructions and MIPs were obtained to evaluate the vascular anatomy. Carotid stenosis measurements (  when applicable) are obtained utilizing NASCET criteria, using the distal internal carotid diameter as the denominator. CONTRAST:  26mL OMNIPAQUE IOHEXOL 350 MG/ML SOLN COMPARISON:  CT and MRI brain 07/08/2021; MRA head 2015 FINDINGS: CT HEAD Brain: There is no acute intracranial hemorrhage. There is new loss of gray-white differentiation corresponding to some of the areas of infarction seen on the MRI, particularly in the right hemisphere. There is no mass effect. No hydrocephalus. Stable chronic infarcts and chronic microvascular ischemic changes. Vascular: No new finding. Skull: Calvarium is unremarkable. Sinuses/Orbits: No acute finding. Other: None. Review of the MIP images confirms the above findings CTA NECK Aortic arch: Mild calcified plaque along the arch. Great vessel origins appear.  Right carotid system: Patent.  No stenosis. Left carotid system: Patent. Plaque at the ICA origin causes less than 50% stenosis. Vertebral arteries: Patent.  Codominant.  No stenosis. Skeleton: Degenerative changes of the included spine. Other neck: Enlarged, heterogeneous thyroid previously evaluated by ultrasound. Endotracheal and enteric tubes are present Upper chest: Scarring with traction bronchiectasis. Review of the MIP images confirms the above findings CTA HEAD Anterior circulation: Intracranial internal carotid arteries are patent with calcified plaque causing mild stenosis. Anterior and middle cerebral arteries are patent. Atherosclerotic irregularity of distal branches. Posterior circulation: Intracranial vertebral arteries are patent. Basilar artery is patent. Major cerebellar artery origins are patent. Right posterior communicating artery is present. Posterior cerebral arteries are patent. Venous sinuses: Patent as allowed by contrast bolus timing. Review of the MIP images confirms the above findings IMPRESSION: No large vessel occlusion or hemodynamically significant stenosis in the neck. No proximal intracranial vessel occlusion. Atherosclerotic irregularity of medium to small vessel branches in the anterior circulation. Electronically Signed   By: Macy Mis M.D.   On: 06/21/2021 14:48   DG Abd 1 View  Result Date: 06/21/2021 CLINICAL DATA:  NG tube placement. EXAM: ABDOMEN - 1 VIEW COMPARISON:  CT scan 06/27/2021 FINDINGS: The bowel gas pattern is unremarkable. The NG tube tip is in the antropyloric region of the stomach. IMPRESSION: NG tube tip is in the antropyloric region of the stomach. Electronically Signed   By: Marijo Sanes M.D.   On: 06/21/2021 13:37   CT Head Wo Contrast  Result Date: 06/11/2021 CLINICAL DATA:  Level 1 trauma.  Found down. EXAM: CT HEAD WITHOUT CONTRAST CT MAXILLOFACIAL WITHOUT CONTRAST CT CERVICAL SPINE WITHOUT CONTRAST TECHNIQUE: Multidetector CT imaging  of the head, cervical spine, and maxillofacial structures were performed using the standard protocol without intravenous contrast. Multiplanar CT image reconstructions of the cervical spine and maxillofacial structures were also generated. COMPARISON:  None. FINDINGS: CT HEAD FINDINGS Brain: No evidence of acute infarction, hemorrhage, hydrocephalus, extra-axial collection or mass lesion/mass effect. 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 Vascular: No hyperdense vessel or unexpected calcification. Skull: No acute fracture CT MAXILLOFACIAL FINDINGS Osseous: No fracture or mandibular dislocation. No destructive process. Orbits: No evidence of injury Sinuses: Negative for hemosinus Soft tissues: No opaque foreign body or measurable hematoma. CT CERVICAL SPINE FINDINGS Alignment: No traumatic malalignment. Skull base and vertebrae: No acute fracture. No primary bone lesion or focal pathologic process. Soft tissues and spinal canal: No prevertebral fluid or swelling. No visible canal hematoma. Disc levels: Generalized disc collapse with endplate and facet spurring narrowing foramina diffusely, foraminal narrowing especially along the right-side of a scoliotic curvature. Upper chest: Reported separately IMPRESSION: No evidence of acute intracranial or cervical spine injury. Negative for facial fracture. Electronically Signed   By: Gilford Silvius.D.  On: 06/19/2021 11:07   CT Cervical Spine Wo Contrast  Result Date: 07/07/2021 CLINICAL DATA:  Level 1 trauma.  Found down. EXAM: CT HEAD WITHOUT CONTRAST CT MAXILLOFACIAL WITHOUT CONTRAST CT CERVICAL SPINE WITHOUT CONTRAST TECHNIQUE: Multidetector CT imaging of the head, cervical spine, and maxillofacial structures were performed using the standard protocol without intravenous contrast. Multiplanar CT image reconstructions of the cervical spine and maxillofacial structures were also generated. COMPARISON:  None. FINDINGS: CT HEAD FINDINGS  Brain: No evidence of acute infarction, hemorrhage, hydrocephalus, extra-axial collection or mass lesion/mass effect. 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 Vascular: No hyperdense vessel or unexpected calcification. Skull: No acute fracture CT MAXILLOFACIAL FINDINGS Osseous: No fracture or mandibular dislocation. No destructive process. Orbits: No evidence of injury Sinuses: Negative for hemosinus Soft tissues: No opaque foreign body or measurable hematoma. CT CERVICAL SPINE FINDINGS Alignment: No traumatic malalignment. Skull base and vertebrae: No acute fracture. No primary bone lesion or focal pathologic process. Soft tissues and spinal canal: No prevertebral fluid or swelling. No visible canal hematoma. Disc levels: Generalized disc collapse with endplate and facet spurring narrowing foramina diffusely, foraminal narrowing especially along the right-side of a scoliotic curvature. Upper chest: Reported separately IMPRESSION: No evidence of acute intracranial or cervical spine injury. Negative for facial fracture. Electronically Signed   By: Jorje Guild M.D.   On: 06/26/2021 11:07   MR BRAIN WO CONTRAST  Result Date: 07/08/2021 CLINICAL DATA:  Neuro deficit, stroke suspected EXAM: MRI HEAD WITHOUT CONTRAST TECHNIQUE: Multiplanar, multiecho pulse sequences of the brain and surrounding structures were obtained without intravenous contrast. COMPARISON:  05/17/2019. FINDINGS: Brain: Areas of cortical restricted diffusion with ADC correlate in the right MCA territory (series 5, image 95, for example), with additional smaller areas of cortical restricted diffusion in the right PCA territory (series 5, image 76-7) and right ACA territory (series 5, image 97-99), with additional more punctate right MCA white matter infarcts (series 5, image 86 for example), and with additional scattered left frontal infarcts (series 5, images 88 and 92). No significant gyral swelling. No acute hemorrhage,  mass, mass effect, or midline shift. Confluent T2 hyperintense signal in the periventricular white matter and pons, likely the sequela of severe chronic small vessel ischemic disease. Remote left parietal infarct. Vascular: Normal flow voids. Skull and upper cervical spine: Choose 1 Sinuses/Orbits: Mucosal thickening in the right frontal sinus, right ethmoid air cells, right sphenoid sinus, and right maxillary sinus. Status post bilateral lens replacements. Other: None. IMPRESSION: Restricted diffusion in the right frontal parietal cortex, primarily in the right MCA territory, but also to a lesser extent in the right ACA, right PCA, and left ACA and MCA territory. This is overall concerning for an embolic etiology. No evidence of hemorrhagic conversion, mass effect, or midline shift. These results will be called to the ordering clinician or representative by the Radiologist Assistant, and communication documented in the PACS or [employer]. Electronically Signed   By: Merilyn Baba M.D.   On: 06/28/2021 23:39   DG Pelvis Portable  Result Date: 06/23/2021 CLINICAL DATA:  Trauma EXAM: PORTABLE PELVIS 1-2 VIEWS COMPARISON:  None. FINDINGS: Patient is rotated to the left which somewhat evaluation. There is no evidence of pelvic fracture or diastasis. No pelvic bone lesions are seen. Limited evaluation of the sacrum due to overlying bowel gas. IMPRESSION: No acute osseous abnormality. Electronically Signed   By: Yetta Glassman M.D.   On: 07/05/2021 10:46   DG Chest Port 1 View  Result Date: 06/23/2021  CLINICAL DATA:  Respiratory failure EXAM: PORTABLE CHEST 1 VIEW COMPARISON:  Chest radiograph 06/21/2021 FINDINGS: The endotracheal tube tip is approximately 3.4 cm from the carina. The left sided vascular catheter terminates at the cavoatrial junction. The enteric catheter tip projects over the region of the pylorus. The cardiomediastinal silhouette is stable. There are likely small bilateral pleural  effusions with adjacent subsegmental atelectasis. Otherwise, there is no new focal consolidation. There is no pulmonary edema. There is no appreciable pneumothorax. Chain sutures in the left upper lobe are unchanged. IMPRESSION: Small bilateral pleural effusions with adjacent atelectasis, increased in conspicuity since 06/21/2021. Electronically Signed   By: Valetta Mole M.D.   On: 06/23/2021 09:22   DG CHEST PORT 1 VIEW  Result Date: 06/21/2021 CLINICAL DATA:  Shortness of breath EXAM: PORTABLE CHEST 1 VIEW COMPARISON:  Previous studies including the examination of 06/16/2021 FINDINGS: Apparent shift of mediastinum to the left may be due to rotation. Transverse diameter of heart is within normal limits. Thoracic aorta is tortuous and ectatic. Tip of endotracheal tube is 4.1 cm above the carina. Tip of left jugular central venous catheter is seen at the junction of superior vena cava and right atrium. Tip of enteric tube is seen in the stomach. Side-port in the enteric tube is in the lower thoracic esophagus close to gastroesophageal junction. Linear densities in the right upper lung fields may suggest scarring or subsegmental atelectasis. There is no new focal pulmonary consolidation. There is no pleural effusion or pneumothorax. IMPRESSION: There are no new infiltrates or signs of pulmonary edema. Side-port in enteric tube is seen in the lower thoracic esophagus close to gastroesophageal junction. Enteric tube could be advanced 5-10 cm to place the side port within the stomach. Electronically Signed   By: Elmer Picker M.D.   On: 06/21/2021 08:08   DG Chest Portable 1 View  Result Date: 06/18/2021 CLINICAL DATA:  Central line placement EXAM: PORTABLE CHEST 1 VIEW COMPARISON:  Previous studies including examination done earlier today. FINDINGS: Cardiac size is within normal limits. Thoracic aorta is tortuous and ectatic. Tip of endotracheal tube is approximately 3.6 cm above the carina. Enteric tube  is noted traversing the esophagus with its side port at the gastroesophageal junction. There is interval placement of left internal jugular central venous catheter with its tip in the region of junction of superior vena cava and right atrium. Linear density is seen in the right upper lung fields and left mid lung fields suggesting with no significant interval change suggesting scarring or subsegmental atelectasis. There is no new focal consolidation. There are no signs of alveolar pulmonary edema. IMPRESSION: There are no new infiltrates or signs of pulmonary edema. Linear densities in the right upper lobe and left mid lung fields may suggest scarring or subsegmental atelectasis. Other findings as described in the body of the report. Electronically Signed   By: Elmer Picker M.D.   On: 07/01/2021 12:59   DG Chest Port 1 View  Result Date: 06/24/2021 CLINICAL DATA:  Trauma EXAM: PORTABLE CHEST 1 VIEW COMPARISON:  Chest x-ray dated November 02, 2020 FINDINGS: ET tube tip projects approximately 1.0 cm from the carina. Enteric tube partially seen coursing below diaphragm. Evaluation is limited due to left port patient rotation. Bilateral upper lung linear opacities are unchanged compared to prior exam likely due to scarring or atelectasis. No focal consolidation. No large pleural effusion or evidence of pneumothorax. Limited evaluation of cardiac and mediastinal contours due to patient rotation. IMPRESSION: No acute cardiopulmonary  no acute cardiopulmonary abnormality, evaluation somewhat limited due to patient rotation. Electronically Signed   By: Yetta Glassman M.D.   On: 07/05/2021 10:44   EEG adult  Result Date: 07/05/2021 Lora Havens, MD     06/09/2021  2:29 PM Patient Name: JARETZI KAUT MRN: [id number] Epilepsy Attending: Lora Havens Referring Physician/Provider: Dr Ina Homes Date: 07/08/2021 Duration: 24.16 mins Patient history: 87yo F with ams. EEG to evaluate for seizure Level of  alertness: comatose AEDs during EEG study: Propofol Technical aspects: This EEG study was done with scalp electrodes positioned according to the 10-20 International system of electrode placement. Electrical activity was acquired at a sampling rate of 500Hz  and reviewed with a high frequency filter of 70Hz  and a low frequency filter of 1Hz . EEG data were recorded continuously and digitally stored. Description: EEG showed continuous low amplitude 2-3hz  delta slowing in right hemisphere. There was also predominantly 5-8Hz  theta-alpha activity in left hemisphere admixed with intermittent 2-3hz  delta slowing. Hyperventilation and photic stimulation were not performed.   ABNORMALITY - Continuous slow, generalized and lateralized right hemisphere IMPRESSION: This study is suggestive of cortical dysfunction arising from right hemisphere, nonspecific etiology but likely secondary to underlying structural abnormality, post-ictal state. Additionally there is moderate diffuse encephalopathy, nonspecific etiology but likely could be secondary to sedation. No seizures or epileptiform discharges were seen throughout the recording. Dr Rory Percy was notified. Priyanka Barbra Sarks   Overnight EEG with video  Result Date: 06/22/2021 Lora Havens, MD     06/23/2021  9:24 AM Patient Name: ALEXIANA MORRISSETTE MRN: [id number] Epilepsy Attending: Lora Havens Referring Physician/Provider: Dr Rosalin Hawking Duration: 06/21/2021 1445 to 06/22/2021 1445  Patient history: 87yo F with ams. EEG to evaluate for seizure  Level of alertness: comatose  AEDs during EEG study: Propofol, LEV,VPA  Technical aspects: This EEG study was done with scalp electrodes positioned according to the 10-20 International system of electrode placement. Electrical activity was acquired at a sampling rate of 500Hz  and reviewed with a high frequency filter of 70Hz  and a low frequency filter of 1Hz . EEG data were recorded continuously and digitally stored.  Description: EEG  initially showed continuous low amplitude 2-3hz  delta slowing in right hemisphere. There was also predominantly 5-8Hz  theta-alpha activity in left hemisphere admixed with intermittent 2-3hz  delta slowing.  After around 2330 on 06/21/2021, as medications were adjusted, EEG was suggestive of burst attenuation pattern with bursts of low amplitude polymorphic asynchronous 2 to 3 Hz delta slowing with overriding 15 to 18 Hz beta activity lasting 2 to 3 seconds alternating with 5 to 8 seconds of generalized EEG attenuation. Patient event button was pressed on 06/22/2021 at 2001, 2016, 2216, 2038, 2253, 2309 and 2315 for left side neck jerking and possible left arm jerking ( covered under blanket).  Concomitant EEG initially showed sharply contoured 6 to 8 Hz theta-alpha activity in the right frontal temporal region which gradually evolved into 2 to 3 Hz delta slowing. Hyperventilation and photic stimulation were not performed.    ABNORMALITY - Focal seizure, right frontotemporal region  - Continuous slow, generalized and lateralized right hemisphere - Burst attenuation, generalized  IMPRESSION: This study showed focal seizures arising from right frontotemporal region on 06/22/2021 at 2001, 2016, 2216, 2038, 2253, 2309 and 2315, lasting about 2 minute each.  During the seizure patient was noted to have left-sided neck jerking and possible left arm jerking ( covered under blanket). EEG was also suggestive of cortical dysfunction arising from right hemisphere,  nonspecific etiology but likely secondary to underlying structural abnormality, post-ictal state.  Additionally EEG was initially suggestive of moderate diffuse encephalopathy.  After around 2330 on 06/21/2021, as medications were adjusted, EEG was suggestive of profound diffuse encephalopathy, nonspecific etiology but likely could be secondary to sedation.  Lora Havens   ECHOCARDIOGRAM COMPLETE  Result Date: 06/23/2021    ECHOCARDIOGRAM REPORT   Patient  Name:   DRUE HOLLE [facility] Date of Exam: 06/23/2021 Medical Rec #:  [id number]     Height:       62.0 in Accession #:    [id number]    Weight:       117.7 lb Date of Birth:  06/04/1932     BSA:          1.526 m Patient Age:    31 years      BP:           149/58 mmHg Patient Gender: F             HR:           80 bpm. Exam Location:  Inpatient Procedure: 2D Echo, 3D Echo, Cardiac Doppler, Color Doppler and Strain Analysis Indications:    Stroke I63.9  History:        Patient has no prior history of Echocardiogram examinations.                 Signs/Symptoms:Fever; Risk Factors:Diabetes and Hypertension.  Sonographer:    Darlina Sicilian RDCS Referring Phys: [id number] [facility]  Sonographer Comments: Echo performed with patient supine and on artificial respirator. IMPRESSIONS  1. Left ventricular ejection fraction, by estimation, is 65 to 70%. The left ventricle has normal function. The left ventricle has no regional wall motion abnormalities. There is mild left ventricular hypertrophy of the basal segment. Left ventricular diastolic parameters are consistent with Grade I diastolic dysfunction (impaired relaxation).  2. Right ventricular systolic function is normal. The right ventricular size is normal. Tricuspid regurgitation signal is inadequate for assessing PA pressure.  3. The mitral valve is grossly normal. No evidence of mitral valve regurgitation.  4. The aortic valve is grossly normal. Aortic valve regurgitation is not visualized.  5. The inferior vena cava is normal in size with greater than 50% respiratory variability, suggesting right atrial pressure of 3 mmHg. Comparison(s): No prior Echocardiogram. Conclusion(s)/Recommendation(s): Normal biventricular function without evidence of hemodynamically significant valvular heart disease. FINDINGS  Left Ventricle: Left ventricular ejection fraction, by estimation, is 65 to 70%. The left ventricle has normal function. The left ventricle has no regional wall motion  abnormalities. Global longitudinal strain performed but not reported based on interpreter judgement due to suboptimal tracking. The left ventricular internal cavity size was normal in size. There is mild left ventricular hypertrophy of the basal segment. Left ventricular diastolic parameters are consistent with Grade I diastolic dysfunction (impaired relaxation). Right Ventricle: The right ventricular size is normal. No increase in right ventricular wall thickness. Right ventricular systolic function is normal. Tricuspid regurgitation signal is inadequate for assessing PA pressure. Left Atrium: Left atrial size was normal in size. Right Atrium: Right atrial size was normal in size. Pericardium: There is no evidence of pericardial effusion. Mitral Valve: The mitral valve is grossly normal. No evidence of mitral valve regurgitation. Tricuspid Valve: The tricuspid valve is grossly normal. Tricuspid valve regurgitation is not demonstrated. Aortic Valve: Focal calcification. The aortic valve is grossly normal. Aortic valve regurgitation is not visualized. Pulmonic Valve: The pulmonic valve was grossly normal.  Pulmonic valve regurgitation is not visualized. Aorta: The aortic root and ascending aorta are structurally normal, with no evidence of dilitation. Venous: The inferior vena cava is normal in size with greater than 50% respiratory variability, suggesting right atrial pressure of 3 mmHg. IAS/Shunts: No atrial level shunt detected by color flow Doppler.  LEFT VENTRICLE PLAX 2D LVIDd:         3.60 cm   Diastology LVIDs:         2.00 cm   LV e' medial:    5.88 cm/s LV PW:         1.00 cm   LV E/e' medial:  16.1 LV IVS:        1.50 cm   LV e' lateral:   6.20 cm/s LVOT diam:     1.70 cm   LV E/e' lateral: 15.3 LV SV:         57 LV SV Index:   37 LVOT Area:     2.27 cm                           3D Volume EF:                          3D EF:        71 %                          LV EDV:       68 ml                           LV ESV:       20 ml                          LV SV:        49 ml RIGHT VENTRICLE RV S prime:     11.60 cm/s TAPSE (M-mode): 2.4 cm LEFT ATRIUM             Index        RIGHT ATRIUM          Index LA diam:        3.10 cm 2.03 cm/m   RA Area:     6.94 cm LA Vol (A2C):   20.5 ml 13.43 ml/m  RA Volume:   11.80 ml 7.73 ml/m LA Vol (A4C):   36.5 ml 23.92 ml/m LA Biplane Vol: 27.7 ml 18.15 ml/m  AORTIC VALVE LVOT Vmax:   115.00 cm/s LVOT Vmean:  82.800 cm/s LVOT VTI:    0.252 m  AORTA Ao Root diam: 2.80 cm Ao Asc diam:  3.00 cm MITRAL VALVE MV Area (PHT): 3.65 cm     SHUNTS MV Decel Time: 208 msec     Systemic VTI:  0.25 m MV E velocity: 94.70 cm/s   Systemic Diam: 1.70 cm MV A velocity: 110.00 cm/s MV E/A ratio:  0.86 Mary Scientist, physiological signed by Phineas Inches Signature Date/Time: 06/23/2021/2:28:46 PM    Final    VAS [place] LOWER EXTREMITY VENOUS (DVT)  Result Date: 06/22/2021  Lower Venous DVT Study Patient Name:  OVIDA MATHE [facility]  Date of Exam:   06/22/2021 Medical Rec #: [id number]      Accession #:    [id number] Date of Birth: 1931-07-21  Patient Gender: F Patient Age:   25 years Exam Location:  [facility] Procedure:      VAS [place] LOWER EXTREMITY VENOUS (DVT) Referring Phys: Cornelius Moras XU --------------------------------------------------------------------------------  Indications: Stroke.  Comparison Study: no prior Performing Technologist: Archie Patten RVS  Examination Guidelines: A complete evaluation includes B-mode imaging, spectral Doppler, color Doppler, and power Doppler as needed of all accessible portions of each vessel. Bilateral testing is considered an integral part of a complete examination. Limited examinations for reoccurring indications may be performed as noted. The reflux portion of the exam is performed with the patient in reverse Trendelenburg.  +---------+---------------+---------+-----------+----------+--------------+  RIGHT      Compressibility Phasicity Spontaneity Properties Thrombus Aging  +---------+---------------+---------+-----------+----------+--------------+  CFV       Full            Yes       Yes                                    +---------+---------------+---------+-----------+----------+--------------+  SFJ       Full                                                             +---------+---------------+---------+-----------+----------+--------------+  FV Prox   Full                                                             +---------+---------------+---------+-----------+----------+--------------+  FV Mid    Full            Yes       Yes                                    +---------+---------------+---------+-----------+----------+--------------+  FV Distal Full            Yes       Yes                                    +---------+---------------+---------+-----------+----------+--------------+  PFV       Full                                                             +---------+---------------+---------+-----------+----------+--------------+  POP       Full            Yes       Yes                                    +---------+---------------+---------+-----------+----------+--------------+  PTV       Full                                                             +---------+---------------+---------+-----------+----------+--------------+  PERO      Full                                                             +---------+---------------+---------+-----------+----------+--------------+   +---------+---------------+---------+-----------+----------+--------------+  LEFT      Compressibility Phasicity Spontaneity Properties Thrombus Aging  +---------+---------------+---------+-----------+----------+--------------+  CFV       Full            Yes       Yes                                    +---------+---------------+---------+-----------+----------+--------------+  SFJ       Full                                                              +---------+---------------+---------+-----------+----------+--------------+  FV Prox   Full                                                             +---------+---------------+---------+-----------+----------+--------------+  FV Mid    Full                                                             +---------+---------------+---------+-----------+----------+--------------+  FV Distal                 Yes       Yes                                    +---------+---------------+---------+-----------+----------+--------------+  PFV       Full                                                             +---------+---------------+---------+-----------+----------+--------------+  POP       Full            Yes       Yes                                    +---------+---------------+---------+-----------+----------+--------------+  PTV       Full                                                             +---------+---------------+---------+-----------+----------+--------------+  PERO      Full                                                             +---------+---------------+---------+-----------+----------+--------------+     Summary: BILATERAL: - No evidence of deep vein thrombosis seen in the lower extremities, bilaterally. -No evidence of popliteal cyst, bilaterally.   *See table(s) above for measurements and observations. Electronically signed by Monica Martinez MD on 06/22/2021 at 7:48:31 PM.    Final    CT CHEST ABDOMEN PELVIS WO CONTRAST  Result Date: 06/15/2021 CLINICAL DATA:  Level 1 trauma. Fall. EXAM: CT CHEST, ABDOMEN AND PELVIS WITHOUT CONTRAST TECHNIQUE: Multidetector CT imaging of the chest, abdomen and pelvis was performed following the standard protocol without IV contrast. COMPARISON:  None similar FINDINGS: CT CHEST FINDINGS Cardiovascular: Normal heart size. Trace anterior pericardial effusion which appears low-density. Coronary atherosclerosis. Mediastinum/Nodes:  Endotracheal and enteric tubes are in unremarkable position. No hematoma or pneumomediastinum. 3.2 cm left thyroid nodule, unchanged from 2020. This was evaluated by ultrasound in 2019. No follow-up recommended unless clinically warranted (ref: J Am Coll Radiol. 2015 Feb;12(2): 143-50). Lungs/Pleura: Bands of scarring in the upper lungs with mild, cylindrical left upper lobe bronchiectasis. Prior left apical surgery with lung sutures. No hemothorax, pneumothorax, or lung contusion. Musculoskeletal: Scoliosis.  No acute fracture or subluxation. CT ABDOMEN PELVIS FINDINGS Hepatobiliary: No hepatic injury or perihepatic hematoma. Gallbladder is unremarkable Pancreas: Unremarkable Spleen: No splenic injury or perisplenic hematoma. Adrenals/Urinary Tract: No adrenal hemorrhage or renal injury identified. Bladder is unremarkable. Bilateral nephrolithiasis. Adjacent or lobulated right lower pole calculus in total measuring 9 mm. 3 mm interpolar left renal calculus. Stomach/Bowel: No evidence of injury. Rectal stool distention. The enteric tube tip is at the stomach. Vascular/Lymphatic: Stranding around the right femoral vessels related to phlebotomy per report. Atheromatous calcification. Reproductive: Hysterectomy Other: No ascites or pneumoperitoneum Musculoskeletal: Scoliosis and ordinary degeneration for age. No acute fracture or subluxation. IMPRESSION: No evidence of acute intrathoracic or intra-abdominal injury. Electronically Signed   By: Jorje Guild M.D.   On: 06/16/2021 11:05   CT Maxillofacial Wo Contrast  Result Date: 06/22/2021 CLINICAL DATA:  Level 1 trauma.  Found down. EXAM: CT HEAD WITHOUT CONTRAST CT MAXILLOFACIAL WITHOUT CONTRAST CT CERVICAL SPINE WITHOUT CONTRAST TECHNIQUE: Multidetector CT imaging of the head, cervical spine, and maxillofacial structures were performed using the standard protocol without intravenous contrast. Multiplanar CT image reconstructions of the cervical spine and  maxillofacial structures were also generated. COMPARISON:  None. FINDINGS: CT HEAD FINDINGS Brain: No evidence of acute infarction, hemorrhage, hydrocephalus, extra-axial collection or mass lesion/mass effect. 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 Vascular: No hyperdense vessel or unexpected calcification. Skull: No acute fracture CT MAXILLOFACIAL FINDINGS Osseous: No fracture or mandibular dislocation. No destructive process. Orbits: No evidence of injury Sinuses: Negative for hemosinus Soft tissues: No opaque foreign body or measurable hematoma. CT CERVICAL SPINE FINDINGS Alignment: No traumatic malalignment. Skull base and vertebrae: No acute fracture. No primary bone lesion or focal pathologic process. Soft tissues and spinal canal: No prevertebral fluid or swelling. No visible canal hematoma. Disc levels: Generalized disc collapse with endplate and facet spurring narrowing foramina diffusely, foraminal narrowing especially along the right-side of a scoliotic curvature. Upper chest: Reported separately IMPRESSION: No evidence of acute intracranial or  cervical spine injury. Negative for facial fracture. Electronically Signed   By: Tiburcio Pea M.D.   On: 06/18/2021 11:07     PHYSICAL EXAM  Temp:  [98.5 F (36.9 C)-99.3 F (37.4 C)] 98.8 F (37.1 C) (12/21 1143) Pulse Rate:  [67-81] 73 (12/21 1015) Resp:  [15-26] 18 (12/21 1100) BP: (124-184)/(48-107) 142/63 (12/21 1000) SpO2:  [97 %-100 %] 100 % (12/21 1015) FiO2 (%):  [30 %] 30 % (12/21 1100)  General - Well nourished, well developed elderly lady, intubated off sedation.  Ophthalmologic - fundi not visualized due to noncooperation.  Cardiovascular - Regular rate and rhythm.  Neuro - intubated  off sedation but remains unresponsive, eyes closed, not following commands. With forced eye opening, eyes in mid position, not blinking to visual threat, doll's eyes absent, not tracking, right pupil 1.72mm but reactive, left  pupil pinpoint but irregular shape with evidence of prior ophthalmic surgery. Corneal reflex absent bilaterally, gag and cough present. Breathing over the vent.  Facial symmetry not able to test due to ET tube.  Tongue protrusion not cooperative. On pain stimulation, no significant movement of all extremities except trace withdrawal in the left greater than right lower extremity.. No babinski. Sensation, coordination and gait not tested.   ASSESSMENT/PLAN Julia [name] is a 87 y.o. female with history of cerebral palsy, hypertension, diabetes, sarcoidosis admitted for #home with facial trauma, altered mental status. No tPA given due to unknown last known well.    Stroke:  right MCA and ACA infarcts embolic pattern secondary to unclear source, concerning for occult A. fib CT no acute abnormality MRI right MCA, MCA/PCA and MCA/ACA scattered infarcts CT head and neck unremarkable 2D Echo ejection fraction 65 to 70%. LE venous Doppler no DVT LDL 25 HgbA1c 8.0 Heparin subcu for VTE prophylaxis aspirin 81 mg daily prior to admission, now on aspirin 325 mg daily given potential procedures (trach or PEG) Ongoing aggressive stroke risk factor management Therapy recommendations: Pending Disposition: Pending, agree with CCM, if pt not make much progress over the weekend, will consider palliative care involvement.   Seizure Reported right facial twitching and right arm jerking movement Received Versed and propofol bolus EEG 12/13 cortical dysfunction arising from right hemisphere, nonspecific etiology  LTM EEG showed focal seizures arising from right frontotemporal region lasting about 2 minute each.  During the seizure patient was noted to have left-sided neck jerking and possible left arm jerking  Keppra load followed by 500 bid->1500 bid Depakote load x 2, increased to 750 milligrams Q6 Depakote level 33 -> 55. >62 Now off sedation, few brief silent seizures continue on long-term EEG  monitoring Fever Leukocytosis  Spiking fever 12/13, T-max 100.9->afebrile Today afebrile WBC 17.8-19.0-14.2-16.7 UA WBC 20-51 CXR unremarkable Blood culture NGTD On cefepime -> zosyn  Diabetes, uncontrolled HgbA1c 8.0 goal < 7.0 Uncontrolled Currently on Levemir 5->10->15 U twice daily CBG monitoring SSI DM education and close PCP follow up  Respiratory failure Intubated on sedation CCM on board Not candidate for extubation today  Hypertension Stable Long term BP goal normotensive  Other Stroke Risk Factors Advanced age  Other Active Problems Cerebral palsy Sarcoidosis  Hospital day # 8 Patient neurological exam continues to remain quite poor and patient does not wake up despite being off sedation and not having seizures making prognosis quite poor.  Await goals of care discussion with family and palliative care team meeting today discussed with Dr. Merrily Pew critical care medicine.   This patient is critically ill due  to right MCA and ACA infarcts, seizure, leukocytosis, respiratory failure, dysphagia and at significant risk of neurological worsening, death form recurrent stroke, status epilepticus, sepsis, heart failure. This patient's care requires constant monitoring of vital signs, hemodynamics, respiratory and cardiac monitoring, review of multiple databases, neurological assessment, discussion with family, other specialists and medical decision making of high complexity. I spent 30 minutes of neurocritical care time in the care of this patient.   Antony Contras, MD Stroke Neurology 06/28/2021 1:49 PM    To contact Stroke Continuity provider, please refer to [email or web address] After hours, contact General Neurology

## 2021-06-28 NOTE — Care Plan (Signed)
GOALS OF CARE DISCUSSION   The Clinical status was relayed to patient's son at bedside in detail.   Updated and notified of patients medical condition.     Patient remains unresponsive and will not open eyes to command.   Patient is having a weak cough and struggling to remove secretions.   Explained to family course of therapy and the modalities  Signs of brain damage  Recommend follow up NEUROLOGY recommendations   Patient with Progressive multiorgan failure with a very high probablity of a very minimal chance of meaningful recovery despite all aggressive and optimal medical therapy.  Per patient's family wishes, patient was made DNR, orders were written Patient's son will bring her living will tomorrow to decide further course of action.     Family are satisfied with Plan of action and management. All questions answered    Cheri Fowler MD [place] [facility] See Amion for pager If no response to pager, please call [phone] until 7pm After 7pm, Please call E-link [phone]

## 2021-06-28 NOTE — Progress Notes (Signed)
Nutrition Follow-up  DOCUMENTATION CODES:   Non-severe (moderate) malnutrition in context of chronic illness  INTERVENTION:   Continue tube feeds via OG tube: - Increase Vital AF 1.2 to 50 ml/hr (1200 ml/day)  Tube feeding regimen provides 1440 kcal, 90 grams of protein, and 973 ml of H2O.   - Free water flushes per CCM, currently 400 ml q 6 hours; recommend decreasing free water flushes to 200 ml q 6 hours  Total free water with flushes: 2573 ml  NUTRITION DIAGNOSIS:   Moderate Malnutrition related to chronic illness (CP, renal insufficiency, prior stroke) as evidenced by moderate fat depletion, severe muscle depletion.  Ongoing, being addressed via TF  GOAL:   Patient will meet greater than or equal to 90% of their needs  Met via TF  MONITOR:   Vent status, Labs, Weight trends, TF tolerance  REASON FOR ASSESSMENT:   Ventilator, Consult Assessment of nutrition requirement/status  ASSESSMENT:   87 year old female who presented to the ED after being found down and unresponsive by family. PMH of UTI, rhabdomyolysis, CP, renal insufficiency, stroke, TIA, HTN, T2DM, GERD, esophageal stricture. Pt required intubation. Pt found to have embolic CVA.  12/14 - new onset seizures  Discussed pt with RN and during ICU rounds. Pt has been off sedation for more than 5 days and remains unresponsive. Palliative Care is following. Plan is to continue to attempt [place] discussions with pt's family.  Admit weight: 60 kg Current weight: 55.7 kg  Pt with moderate pitting edema to BUE and non-pitting edema to BLE.  Current TF: Vital AF 1.2 @ 45 ml/hr, free water flushes of 400 ml q 6 hours  Patient is currently intubated on ventilator support MV: 6 L/min Temp (24hrs), Avg:98.9 F (37.2 C), Min:98.5 F (36.9 C), Max:99.3 F (37.4 C)  Medications reviewed and include: SSI q 4 hours, levemir 12 units BID, protonix  Labs reviewed: potassium 3.3 CBG's: 136-188 x 24 hours  UOP: 1800  ml x 24 hours Stool: 75 ml x 24 hours via rectal tube I/O's: +10.1 L since admit  Diet Order:   Diet Order             Diet NPO time specified  Diet effective now                   EDUCATION NEEDS:   Not appropriate for education at this time  Skin:  Skin Assessment: Reviewed RN Assessment  Last BM:  06/28/21 rectal tube  Height:   Ht Readings from Last 1 Encounters:  07/01/2021 '5\' 2"'  (1.575 m)    Weight:   Wt Readings from Last 1 Encounters:  06/25/21 55.7 kg    BMI:  Body mass index is 22.46 kg/m.  Estimated Nutritional Needs:   Kcal:  1300-1500  Protein:  65-80 grams  Fluid:  1.3-1.5 L    Gustavus Bryant, MS, RD, LDN Inpatient Clinical Dietitian Please see AMiON for contact information.

## 2021-06-28 NOTE — Progress Notes (Signed)
[facility] ADULT ICU REPLACEMENT PROTOCOL   The patient does apply for the [facility] Adult ICU Electrolyte Replacment Protocol based on the criteria listed below:   1.Exclusion criteria: TCTS patients, ECMO patients, and Dialysis patients 2. Is GFR >/= 30 ml/min? Yes.    Patient's GFR today is >60 3. Is SCr </= 2? Yes.   Patient's SCr is 0.49 mg/dL 4. Did SCr increase >/= 0.5 in 24 hours? No. 5.Pt's weight >40kg  Yes.   6. Abnormal electrolyte(s):  K 3.3  7. Electrolytes replaced per protocol 8.  Call MD STAT for K+ </= 2.5, Phos </= 1, or Mag </= 1 Physician:  S. Bobbye Morton R Beonka [place] 06/28/2021 4:10 AM

## 2021-06-29 LAB — CBC
HCT: 30.6 % — ABNORMAL LOW (ref 36.0–46.0)
Hemoglobin: 9.7 g/dL — ABNORMAL LOW (ref 12.0–15.0)
MCH: 23.1 pg — ABNORMAL LOW (ref 26.0–34.0)
MCHC: 31.7 g/dL (ref 30.0–36.0)
MCV: 72.9 fL — ABNORMAL LOW (ref 80.0–100.0)
Platelets: 134 10*3/uL — ABNORMAL LOW (ref 150–400)
RBC: 4.2 MIL/uL (ref 3.87–5.11)
RDW: 17 % — ABNORMAL HIGH (ref 11.5–15.5)
WBC: 15.4 10*3/uL — ABNORMAL HIGH (ref 4.0–10.5)
nRBC: 0.1 % (ref 0.0–0.2)

## 2021-06-29 LAB — RENAL FUNCTION PANEL
Albumin: 1.8 g/dL — ABNORMAL LOW (ref 3.5–5.0)
Anion gap: 9 (ref 5–15)
BUN: 17 mg/dL (ref 8–23)
CO2: 26 mmol/L (ref 22–32)
Calcium: 7.8 mg/dL — ABNORMAL LOW (ref 8.9–10.3)
Chloride: 101 mmol/L (ref 98–111)
Creatinine, Ser: 0.48 mg/dL (ref 0.44–1.00)
GFR, Estimated: >60 mL/min (ref >60)
Glucose, Bld: 212 mg/dL — ABNORMAL HIGH (ref 70–99)
Phosphorus: 2.5 mg/dL (ref 2.5–4.6)
Potassium: 4.6 mmol/L (ref 3.5–5.1)
Sodium: 136 mmol/L (ref 135–145)

## 2021-06-29 LAB — GLUCOSE, CAPILLARY
Glucose-Capillary: 204 mg/dL — ABNORMAL HIGH (ref 70–99)
Glucose-Capillary: 201 mg/dL — ABNORMAL HIGH (ref 70–99)
Glucose-Capillary: 166 mg/dL — ABNORMAL HIGH (ref 70–99)

## 2021-06-29 LAB — VALPROIC ACID LEVEL: Valproic Acid Lvl: 79 ug/mL (ref 50.0–100.0)

## 2021-06-29 MED ORDER — GLYCOPYRROLATE 0.2 MG/ML IJ SOLN
0.2000 mg | INTRAMUSCULAR | Status: DC | PRN
  Administered 2021-06-29: 16:00:00 0.2 mg via INTRAVENOUS
  Filled 2021-06-29: qty 1

## 2021-06-29 MED ORDER — SODIUM CHLORIDE 0.9% FLUSH: 10.0000 mL | INTRAVENOUS | Status: DC | PRN

## 2021-06-29 MED ORDER — MORPHINE BOLUS VIA INFUSION
5.0000 mg | INTRAVENOUS | Status: DC | PRN
  Administered 2021-06-29 (×3): 5 mg via INTRAVENOUS
  Filled 2021-06-29: qty 5

## 2021-06-29 MED ORDER — FUROSEMIDE 10 MG/ML IJ SOLN
40.0000 mg | Freq: Once | INTRAMUSCULAR | Status: AC
  Administered 2021-06-29: 09:00:00 40 mg via INTRAVENOUS
  Filled 2021-06-29: qty 4

## 2021-06-29 MED ORDER — MORPHINE SULFATE (PF) 2 MG/ML IV SOLN: 2.0000 mg | INTRAVENOUS | Status: DC | PRN

## 2021-06-29 MED ORDER — FREE WATER
100.0000 mL | Freq: Four times a day (QID) | Status: DC
Start: 2021-06-29 — End: 2021-06-29
  Administered 2021-06-29: 12:00:00 100 mL

## 2021-06-29 MED ORDER — ACETAMINOPHEN 650 MG RE SUPP: 650.0000 mg | Freq: Four times a day (QID) | RECTAL | Status: DC | PRN

## 2021-06-29 MED ORDER — ACETAMINOPHEN 325 MG PO TABS: 650.0000 mg | ORAL_TABLET | Freq: Four times a day (QID) | ORAL | Status: DC | PRN

## 2021-06-29 MED ORDER — POLYVINYL ALCOHOL 1.4 % OP SOLN
1.0000 [drp] | Freq: Four times a day (QID) | OPHTHALMIC | Status: DC | PRN
  Filled 2021-06-29: qty 15

## 2021-06-29 MED ORDER — DIPHENHYDRAMINE HCL 50 MG/ML IJ SOLN: 25.0000 mg | INTRAMUSCULAR | Status: DC | PRN

## 2021-06-29 MED ORDER — GLYCOPYRROLATE 1 MG PO TABS: 1.0000 mg | ORAL_TABLET | ORAL | Status: DC | PRN

## 2021-06-29 MED ORDER — INSULIN ASPART 100 UNIT/ML IJ SOLN
4.0000 [IU] | INTRAMUSCULAR | Status: DC
  Administered 2021-06-29 (×2): 4 [IU] via SUBCUTANEOUS

## 2021-06-29 MED ORDER — MORPHINE 100MG IN NS 100ML (1MG/ML) PREMIX INFUSION
0.0000 mg/h | INTRAVENOUS | Status: DC
Start: 2021-06-29 — End: 2021-06-29
  Administered 2021-06-29: 16:00:00 5 mg/h via INTRAVENOUS
  Filled 2021-06-29: qty 100

## 2021-06-29 MED ORDER — LORAZEPAM 2 MG/ML IJ SOLN
2.0000 mg | INTRAMUSCULAR | Status: DC | PRN
  Administered 2021-06-29: 17:00:00 4 mg via INTRAVENOUS
  Filled 2021-06-29: qty 2

## 2021-06-29 MED ORDER — GLYCOPYRROLATE 0.2 MG/ML IJ SOLN: 0.2000 mg | INTRAMUSCULAR | Status: DC | PRN

## 2021-06-29 MED ORDER — SODIUM CHLORIDE 0.9% FLUSH
10.0000 mL | Freq: Two times a day (BID) | INTRAVENOUS | Status: DC
  Administered 2021-06-29: 15:00:00 10 mL

## 2021-07-09 NOTE — Progress Notes (Signed)
NAME:  Julia [name], MRN:  [id number], DOB:  September 24, 1931, LOS: 9 ADMISSION DATE:  06/30/2021, CONSULTATION DATE:  06/08/2021 REFERRING MD:  Dr. Jeraldine Loots, ER, CHIEF COMPLAINT:  AMS   History of Present Illness:  88 yo female found unresponsive at home.  Required intubation for airway protection.  Found to have leukocytosis, lactic acidosis, hyperglycemia, hypernatremia, HTN (BP 229/119).  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  Pertinent  Medical History  GERD, Esophageal stricture, UTI, Rhabdo, CP, Lt eye aphakia, Cataracts, CVA, HTN, DM type 2  Significant Hospital Events: Including procedures, antibiotic start and stop dates in addition to other pertinent events   12/13 Admit, neurology and trauma consulted. CT head 12/13 >> no acute infarction/hemorrhage/hydrocephalus; remote Lt parieto-occipital infarct. EEG 12/13 >> generalized slowing and Rt hemisphere lateralization.MRI brain 12/13 >> Restricted diffusion in Rt frontal parietal cortex primarily in Rt MCA territory but also Rt ACA/PCA and Lt ACA/MCA territories; concerning for embolic CVA 12/14 new onset seizure, start LTM. CT angio head/neck 12/14 >> no large vessel occlusions EG 12/15 >> focal seizure, Rt frontotemporal region; generalized burst suppression; generalized slowing 12/16 - EEG slow, 2 short seizures in PM, R sided eplileptogenic R hemisphere, comatose 12/16 2 short seizures 12/17 still having seizures. AEDs increased 12/17 comatose off sedation > 24 hours. 2 seizures on EEG 12/18 eeg: having 1-2 seizures/hr without clinical signs, lasting approx 45 sec each  12/19: Palliative care consulted. Son made aware devastating neurological injury by both Neuro and critical care team. LTM findings:  "cortical dysfunction arising from right hemisphere, maximal right posterior quadrant likely secondary to underlying structural abnormality. Additionally there is severe diffuse encephalopathy, nonspecific etiology.  No seizures were seen during the  study". LTM discontinued.  12/21 no changes, L IJ removed, now DNR, weaned several hours on PSV 12/5   Interim History / Subjective:  No further reports of seizure activity More hypertensive Tmax 99.9  Objective   Blood pressure (!) 166/71, pulse 86, temperature 99.9 F (37.7 C), temperature source Axillary, resp. rate 16, height 5\' 2"  (1.575 m), weight 56.4 kg, SpO2 100 %.    Vent Mode: PRVC FiO2 (%):  [30 %] 30 % Set Rate:  [16 bmp] 16 bmp Vt Set:  [400 mL-410 mL] 410 mL PEEP:  [5 cmH20] 5 cmH20 Pressure Support:  [12 cmH20] 12 cmH20 Plateau Pressure:  [17 cmH20-20 cmH20] 20 cmH20   Intake/Output Summary (Last 24 hours) at 28-Jul-2021 0802 Last data filed at 07/28/21 0700 Gross per 24 hour  Intake 2463.07 ml  Output 1750 ml  Net 713.07 ml   Filed Weights   06/24/21 0350 06/25/21 0406 2021/07/28 0500  Weight: 57.6 kg 55.7 kg 56.4 kg    Examination: General:  elderly female lying in bed in NAD HEENT: MM pink/moist, ETT/ OGT, right pupil 2/ reactive, left irregular, +corneal's, slight puffiness around site where L IJ was removed yesterday, no active bleeding or creptus  Neuro:  no eye opening or purposeful spont movement, withdrawals in BLE, no response in uppers CV: rr, NSR, +2 pulses PULM:  non labored on MV, coarse in RUL, otherwise clear, minimal amount of thick old bloody secretions, flipped to PSV 12/5 with TVs ~250 GI: soft, bs active, purwick  Extremities: warm/dry, upper extremity edema, minimal LE edema Skin: no rashes   UOP 1.7L / 24hrs  Net +10.6L   Labs reviewed 12/22:  WBC 12 > 15.4, Hgb 10.6 > 9.7, plt 190 > 134  Resolved Hospital Problem list   Hyperkalemia,  Lactic acidosis, Hypokalemia, hypertensive emergency Recurrent UTI- s/p 5d abx   Assessment & Plan:   Acute metabolic encephalopathy (remains Comatose) s/p Acute embolic CVA c/b New onset seizures (controlled as of 12/19) Plan Ongoing supportive care Off sedation as of 12/16 without  significant improvement- still not needing sedation, prognosis for a meaningful recovery remains poor Neuro following, appreciate input Continue AED per Neuro- keppra and VPA seizure precautions PMT following, appreciate input   Acute hypoxic respiratory failure in setting of above  Aspiration pneumonitis (POA), completed zosyn 12/17 for 5 days of abx Plan Continue MV support, PRVC VAP prevention protocol/ PPI PAD protocol for sedation> not needed daily SAT & SBT- mental status and poor weaning efforts remain barrier to extubation  DM2 with hyperglycemia -Current glycemic control acceptable Plan Glucose trending up, will add TF coverage 4units  q 4hrs Cont mod SSI Levemir at 15 units bid   Anemia of critical illness Thrombocytopenia, new 12/22 Plan Plts 188 > 190 > 134 Trend CBC Transfuse for < 7   Leukocytosis  - completed abx 12/17.  Slight increase in WBC 12 > 15.4, increasing glucose, and tmax 99.9, question if patient is developing SIRS.  Monitor for now, no obvious site of infection.  If develops fever and family wishes to continue aggressive care, can send trach culture and repeat UA and start empiric antibiotics   HTN Hypervolemia - 10L net + and more hypertensive.  Will diurese today with lasix and decrease FWF to q 6hrs as Na remains 136 - continue norvasc 10mg  daily, if BP still consistently elevated after diuresis, can add additional scheduled hypertensive - prn labetalol  GOC -Ongoing.  PMT following, appreciate input.  Discussion held with son 12/21 who changed to DNR but needs more time to discuss with family and look for her living will at home prior to making any further decisions.   Best Practice (right click and "Reselect all SmartList Selections" daily)   Diet/type: tubefeeds DVT prophylaxis: prophylactic heparin  GI prophylaxis: PPI Lines: Central line> removed 12/21, midline to LUE Foley:  N/A Code Status:  DNR Last date of multidisciplinary  goals of care discussion:  12/21  No family at bedside on AM rounds   Critical care time: 30 min     1/22, ACNP [facility] 07/04/2021, 8:02 AM  See Amion for pager If no response to pager, please call PCCM consult pager After 7:00 pm call Elink

## 2021-07-09 NOTE — Progress Notes (Signed)
Patient ID: Julia [name], female   DOB: 16-Aug-1931, 88 y.o.   MRN: [id number]   This nurse practitioner attempted to meet with the patient's son on multiple occasions, unfortunately the meeting never took place.  Discussed with critical care/Dr. Merrily Pew yesterday and bedside RN.    CCM working closely with family for goals of care and treatment team.    This nurse practitioner will reach out at the beginning of the week if goals of care with PMT is indicated  Lorinda Creed NP  Palliative Medicine  Team [phone]  No charge

## 2021-07-09 NOTE — Progress Notes (Signed)
Patient extubated 1643, patient son in room with staff. Patient expired at 31, two RN pronounced death: Elliot Cousin, RN and Nena Polio, RN.  Attending MD notified.

## 2021-07-09 NOTE — Procedures (Signed)
Extubation Procedure Note  Patient Details:   Name: Julia [name] DOB: Dec 02, 1931 MRN: [id number]   Airway Documentation:    Vent end date: 06/08/2021 Vent end time: 1643   Evaluation  O2 sats: stable throughout Complications: No apparent complications Patient did tolerate procedure well. Bilateral Breath Sounds: Clear, Diminished   No, pt could not speak post extubation.  Pt extubated to room air per physician's order and in accordance with the family's wishes.  Audrie Lia 06/10/2021, 4:43 PM

## 2021-07-09 NOTE — Death Summary Note (Signed)
DEATH SUMMARY   Patient Details  Name: Julia [name] MRN: [id number] DOB: 1932-03-01  Admission/Discharge Information   Admit Date:  2021/07/12  Date of Death: Date of Death: Jul 21, 2021  Time of Death: Time of Death: 11/06/1705  Length of Stay: 2022-11-07  Referring Physician: Rogers Blocker, MD   Reason(s) for Hospitalization  Acute metabolic encephalopathy New onset seizures, improved Acute bilateral multifocal strokes in MCA and ACA territory Acute respiratory failure with hypoxia Aspiration pneumonia, completed treatment Poorly controlled diabetes with hyperglycemia, improved Anemia of critical illness Hyperkalemia Lactic acidosis Hypokalemia Hypertensive emergency Recurrent UTI  Diagnoses  Preliminary cause of death:   Withdrawal of care due to persistent vegetative state Secondary Diagnoses (including complications and co-morbidities):  Principal Problem:   Coma ([place]) Active Problems:   Cerebral embolism with cerebral infarction   Malnutrition of moderate degree   Respiratory failure [facility])   Brief Hospital Course (including significant findings, care, treatment, and services provided and events leading to death)  Julia [name] is a 88 y.o. year old female who was found unresponsive at home.  Required intubation for airway protection.  Found to have leukocytosis, lactic acidosis, hyperglycemia, hypernatremia, HTN (BP 229/119).  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  Patient was admitted to ICU, had CT head initially which showed no acute intracranial abnormalities, MRI brain was done which showed bilateral MCA and ACA territory embolic strokes, patient noted to be having seizures, EEG was done which confirmed active seizures, she was started on Keppra and valproic acid she remained seizure-free afterwards, EEG was discontinued.  Despite correction of her metabolic abnormalities patient remain in vegetative state, only withdrawing in lower extremities.  Neurology was following, patient was treated  with antibiotic with for recurrent UTI and aspiration pneumonia, her vent setting was brought down to minimal.  Her fingerstick was controlled.  As patient was not waking up, goals of discussions were carried with the patient's family, patient had living will that she did not want to live on ventilator or with any kind of life support if she requires it for long duration.  So respecting her wishes patient's family decided to proceed with withdrawal of care and keep her comfortable.  Comfort care orders were placed and patient was palliatively extubated on 07/21/2021. Patient lost her pulse at around 5:07 PM and was declared dead.  Patient family was notified  Pertinent Labs and Studies  Significant Diagnostic Studies CT ANGIO HEAD NECK W WO CM  Result Date: 06/21/2021 CLINICAL DATA:  Stroke/TIA, determine embolic source EXAM: CT ANGIOGRAPHY HEAD AND NECK TECHNIQUE: Multidetector CT imaging of the head and neck was performed using the standard protocol during bolus administration of intravenous contrast. Multiplanar CT image reconstructions and MIPs were obtained to evaluate the vascular anatomy. Carotid stenosis measurements (when applicable) are obtained utilizing NASCET criteria, using the distal internal carotid diameter as the denominator. CONTRAST:  37mL OMNIPAQUE IOHEXOL 350 MG/ML SOLN COMPARISON:  CT and MRI brain 12-Jul-2021; MRA head 11-06-2013 FINDINGS: CT HEAD Brain: There is no acute intracranial hemorrhage. There is new loss of gray-white differentiation corresponding to some of the areas of infarction seen on the MRI, particularly in the right hemisphere. There is no mass effect. No hydrocephalus. Stable chronic infarcts and chronic microvascular ischemic changes. Vascular: No new finding. Skull: Calvarium is unremarkable. Sinuses/Orbits: No acute finding. Other: None. Review of the MIP images confirms the above findings CTA NECK Aortic arch: Mild calcified plaque along the arch. Great vessel origins  appear. Right carotid system: Patent.  No stenosis. Left carotid system: Patent. Plaque at the ICA origin causes less than 50% stenosis. Vertebral arteries: Patent.  Codominant.  No stenosis. Skeleton: Degenerative changes of the included spine. Other neck: Enlarged, heterogeneous thyroid previously evaluated by ultrasound. Endotracheal and enteric tubes are present Upper chest: Scarring with traction bronchiectasis. Review of the MIP images confirms the above findings CTA HEAD Anterior circulation: Intracranial internal carotid arteries are patent with calcified plaque causing mild stenosis. Anterior and middle cerebral arteries are patent. Atherosclerotic irregularity of distal branches. Posterior circulation: Intracranial vertebral arteries are patent. Basilar artery is patent. Major cerebellar artery origins are patent. Right posterior communicating artery is present. Posterior cerebral arteries are patent. Venous sinuses: Patent as allowed by contrast bolus timing. Review of the MIP images confirms the above findings IMPRESSION: No large vessel occlusion or hemodynamically significant stenosis in the neck. No proximal intracranial vessel occlusion. Atherosclerotic irregularity of medium to small vessel branches in the anterior circulation. Electronically Signed   By: Macy Mis M.D.   On: 06/21/2021 14:48   DG Abd 1 View  Result Date: 06/21/2021 CLINICAL DATA:  NG tube placement. EXAM: ABDOMEN - 1 VIEW COMPARISON:  CT scan 06/22/2021 FINDINGS: The bowel gas pattern is unremarkable. The NG tube tip is in the antropyloric region of the stomach. IMPRESSION: NG tube tip is in the antropyloric region of the stomach. Electronically Signed   By: Marijo Sanes M.D.   On: 06/21/2021 13:37   CT Head Wo Contrast  Result Date: 06/30/2021 CLINICAL DATA:  Level 1 trauma.  Found down. EXAM: CT HEAD WITHOUT CONTRAST CT MAXILLOFACIAL WITHOUT CONTRAST CT CERVICAL SPINE WITHOUT CONTRAST TECHNIQUE: Multidetector CT  imaging of the head, cervical spine, and maxillofacial structures were performed using the standard protocol without intravenous contrast. Multiplanar CT image reconstructions of the cervical spine and maxillofacial structures were also generated. COMPARISON:  None. FINDINGS: CT HEAD FINDINGS Brain: No evidence of acute infarction, hemorrhage, hydrocephalus, extra-axial collection or mass lesion/mass effect. 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 Vascular: No hyperdense vessel or unexpected calcification. Skull: No acute fracture CT MAXILLOFACIAL FINDINGS Osseous: No fracture or mandibular dislocation. No destructive process. Orbits: No evidence of injury Sinuses: Negative for hemosinus Soft tissues: No opaque foreign body or measurable hematoma. CT CERVICAL SPINE FINDINGS Alignment: No traumatic malalignment. Skull base and vertebrae: No acute fracture. No primary bone lesion or focal pathologic process. Soft tissues and spinal canal: No prevertebral fluid or swelling. No visible canal hematoma. Disc levels: Generalized disc collapse with endplate and facet spurring narrowing foramina diffusely, foraminal narrowing especially along the right-side of a scoliotic curvature. Upper chest: Reported separately IMPRESSION: No evidence of acute intracranial or cervical spine injury. Negative for facial fracture. Electronically Signed   By: Jorje Guild M.D.   On: 07/08/2021 11:07   CT Cervical Spine Wo Contrast  Result Date: 06/12/2021 CLINICAL DATA:  Level 1 trauma.  Found down. EXAM: CT HEAD WITHOUT CONTRAST CT MAXILLOFACIAL WITHOUT CONTRAST CT CERVICAL SPINE WITHOUT CONTRAST TECHNIQUE: Multidetector CT imaging of the head, cervical spine, and maxillofacial structures were performed using the standard protocol without intravenous contrast. Multiplanar CT image reconstructions of the cervical spine and maxillofacial structures were also generated. COMPARISON:  None. FINDINGS: CT HEAD  FINDINGS Brain: No evidence of acute infarction, hemorrhage, hydrocephalus, extra-axial collection or mass lesion/mass effect. 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 Vascular: No hyperdense vessel or unexpected calcification. Skull: No acute fracture CT MAXILLOFACIAL FINDINGS Osseous: No fracture or mandibular dislocation. No destructive process.  Orbits: No evidence of injury Sinuses: Negative for hemosinus Soft tissues: No opaque foreign body or measurable hematoma. CT CERVICAL SPINE FINDINGS Alignment: No traumatic malalignment. Skull base and vertebrae: No acute fracture. No primary bone lesion or focal pathologic process. Soft tissues and spinal canal: No prevertebral fluid or swelling. No visible canal hematoma. Disc levels: Generalized disc collapse with endplate and facet spurring narrowing foramina diffusely, foraminal narrowing especially along the right-side of a scoliotic curvature. Upper chest: Reported separately IMPRESSION: No evidence of acute intracranial or cervical spine injury. Negative for facial fracture. Electronically Signed   By: Jorje Guild M.D.   On: 07/08/2021 11:07   MR BRAIN WO CONTRAST  Result Date: 07/01/2021 CLINICAL DATA:  Neuro deficit, stroke suspected EXAM: MRI HEAD WITHOUT CONTRAST TECHNIQUE: Multiplanar, multiecho pulse sequences of the brain and surrounding structures were obtained without intravenous contrast. COMPARISON:  05/17/2019. FINDINGS: Brain: Areas of cortical restricted diffusion with ADC correlate in the right MCA territory (series 5, image 95, for example), with additional smaller areas of cortical restricted diffusion in the right PCA territory (series 5, image 76-7) and right ACA territory (series 5, image 97-99), with additional more punctate right MCA white matter infarcts (series 5, image 86 for example), and with additional scattered left frontal infarcts (series 5, images 88 and 92). No significant gyral swelling. No acute  hemorrhage, mass, mass effect, or midline shift. Confluent T2 hyperintense signal in the periventricular white matter and pons, likely the sequela of severe chronic small vessel ischemic disease. Remote left parietal infarct. Vascular: Normal flow voids. Skull and upper cervical spine: Choose 1 Sinuses/Orbits: Mucosal thickening in the right frontal sinus, right ethmoid air cells, right sphenoid sinus, and right maxillary sinus. Status post bilateral lens replacements. Other: None. IMPRESSION: Restricted diffusion in the right frontal parietal cortex, primarily in the right MCA territory, but also to a lesser extent in the right ACA, right PCA, and left ACA and MCA territory. This is overall concerning for an embolic etiology. No evidence of hemorrhagic conversion, mass effect, or midline shift. These results will be called to the ordering clinician or representative by the Radiologist Assistant, and communication documented in the PACS or [employer]. Electronically Signed   By: Merilyn Baba M.D.   On: 06/23/2021 23:39   DG Pelvis Portable  Result Date: 06/09/2021 CLINICAL DATA:  Trauma EXAM: PORTABLE PELVIS 1-2 VIEWS COMPARISON:  None. FINDINGS: Patient is rotated to the left which somewhat evaluation. There is no evidence of pelvic fracture or diastasis. No pelvic bone lesions are seen. Limited evaluation of the sacrum due to overlying bowel gas. IMPRESSION: No acute osseous abnormality. Electronically Signed   By: Yetta Glassman M.D.   On: 06/30/2021 10:46   DG Chest Port 1 View  Result Date: 06/23/2021 CLINICAL DATA:  Respiratory failure EXAM: PORTABLE CHEST 1 VIEW COMPARISON:  Chest radiograph 06/21/2021 FINDINGS: The endotracheal tube tip is approximately 3.4 cm from the carina. The left sided vascular catheter terminates at the cavoatrial junction. The enteric catheter tip projects over the region of the pylorus. The cardiomediastinal silhouette is stable. There are likely small bilateral  pleural effusions with adjacent subsegmental atelectasis. Otherwise, there is no new focal consolidation. There is no pulmonary edema. There is no appreciable pneumothorax. Chain sutures in the left upper lobe are unchanged. IMPRESSION: Small bilateral pleural effusions with adjacent atelectasis, increased in conspicuity since 06/21/2021. Electronically Signed   By: Valetta Mole M.D.   On: 06/23/2021 09:22   DG CHEST PORT 1  VIEW  Result Date: 06/21/2021 CLINICAL DATA:  Shortness of breath EXAM: PORTABLE CHEST 1 VIEW COMPARISON:  Previous studies including the examination of 06/12/2021 FINDINGS: Apparent shift of mediastinum to the left may be due to rotation. Transverse diameter of heart is within normal limits. Thoracic aorta is tortuous and ectatic. Tip of endotracheal tube is 4.1 cm above the carina. Tip of left jugular central venous catheter is seen at the junction of superior vena cava and right atrium. Tip of enteric tube is seen in the stomach. Side-port in the enteric tube is in the lower thoracic esophagus close to gastroesophageal junction. Linear densities in the right upper lung fields may suggest scarring or subsegmental atelectasis. There is no new focal pulmonary consolidation. There is no pleural effusion or pneumothorax. IMPRESSION: There are no new infiltrates or signs of pulmonary edema. Side-port in enteric tube is seen in the lower thoracic esophagus close to gastroesophageal junction. Enteric tube could be advanced 5-10 cm to place the side port within the stomach. Electronically Signed   By: Elmer Picker M.D.   On: 06/21/2021 08:08   DG Chest Portable 1 View  Result Date: 07/05/2021 CLINICAL DATA:  Central line placement EXAM: PORTABLE CHEST 1 VIEW COMPARISON:  Previous studies including examination done earlier today. FINDINGS: Cardiac size is within normal limits. Thoracic aorta is tortuous and ectatic. Tip of endotracheal tube is approximately 3.6 cm above the carina.  Enteric tube is noted traversing the esophagus with its side port at the gastroesophageal junction. There is interval placement of left internal jugular central venous catheter with its tip in the region of junction of superior vena cava and right atrium. Linear density is seen in the right upper lung fields and left mid lung fields suggesting with no significant interval change suggesting scarring or subsegmental atelectasis. There is no new focal consolidation. There are no signs of alveolar pulmonary edema. IMPRESSION: There are no new infiltrates or signs of pulmonary edema. Linear densities in the right upper lobe and left mid lung fields may suggest scarring or subsegmental atelectasis. Other findings as described in the body of the report. Electronically Signed   By: Elmer Picker M.D.   On: 06/15/2021 12:59   DG Chest Port 1 View  Result Date: 07/06/2021 CLINICAL DATA:  Trauma EXAM: PORTABLE CHEST 1 VIEW COMPARISON:  Chest x-ray dated November 02, 2020 FINDINGS: ET tube tip projects approximately 1.0 cm from the carina. Enteric tube partially seen coursing below diaphragm. Evaluation is limited due to left port patient rotation. Bilateral upper lung linear opacities are unchanged compared to prior exam likely due to scarring or atelectasis. No focal consolidation. No large pleural effusion or evidence of pneumothorax. Limited evaluation of cardiac and mediastinal contours due to patient rotation. IMPRESSION: No acute cardiopulmonary no acute cardiopulmonary abnormality, evaluation somewhat limited due to patient rotation. Electronically Signed   By: Yetta Glassman M.D.   On: 06/26/2021 10:44   EEG adult  Result Date: 06/08/2021 Julia Havens, MD     06/15/2021  2:29 PM Patient Name: Julia [name] MRN: [id number] Epilepsy Attending: Lora [name] Referring Physician/Provider: Dr Ina Homes Date: 07/01/2021 Duration: 24.16 mins Patient history: 88yo F with ams. EEG to evaluate for  seizure Level of alertness: comatose AEDs during EEG study: Propofol Technical aspects: This EEG study was done with scalp electrodes positioned according to the 10-20 International system of electrode placement. Electrical activity was acquired at a sampling rate of 500Hz  and reviewed with a high frequency  filter of 70Hz  and a low frequency filter of 1Hz . EEG data were recorded continuously and digitally stored. Description: EEG showed continuous low amplitude 2-3hz  delta slowing in right hemisphere. There was also predominantly 5-8Hz  theta-alpha activity in left hemisphere admixed with intermittent 2-3hz  delta slowing. Hyperventilation and photic stimulation were not performed.   ABNORMALITY - Continuous slow, generalized and lateralized right hemisphere IMPRESSION: This study is suggestive of cortical dysfunction arising from right hemisphere, nonspecific etiology but likely secondary to underlying structural abnormality, post-ictal state. Additionally there is moderate diffuse encephalopathy, nonspecific etiology but likely could be secondary to sedation. No seizures or epileptiform discharges were seen throughout the recording. Dr Rory Percy was notified. Julia [name]   Overnight EEG with video  Result Date: 06/22/2021 Julia Havens, MD     06/23/2021  9:24 AM Patient Name: SARAHELIZABETH CANGE MRN: [id number] Epilepsy Attending: Lora [name] Referring Physician/Provider: Dr Rosalin Hawking Duration: 06/21/2021 1445 to 06/22/2021 1445  Patient history: 88yo F with ams. EEG to evaluate for seizure  Level of alertness: comatose  AEDs during EEG study: Propofol, LEV,VPA  Technical aspects: This EEG study was done with scalp electrodes positioned according to the 10-20 International system of electrode placement. Electrical activity was acquired at a sampling rate of 500Hz  and reviewed with a high frequency filter of 70Hz  and a low frequency filter of 1Hz . EEG data were recorded continuously and digitally stored.   Description: EEG initially showed continuous low amplitude 2-3hz  delta slowing in right hemisphere. There was also predominantly 5-8Hz  theta-alpha activity in left hemisphere admixed with intermittent 2-3hz  delta slowing.  After around 2330 on 06/21/2021, as medications were adjusted, EEG was suggestive of burst attenuation pattern with bursts of low amplitude polymorphic asynchronous 2 to 3 Hz delta slowing with overriding 15 to 18 Hz beta activity lasting 2 to 3 seconds alternating with 5 to 8 seconds of generalized EEG attenuation. Patient event button was pressed on 06/22/2021 at 2001, 2016, 2216, 2038, 2253, 2309 and 2315 for left side neck jerking and possible left arm jerking ( covered under blanket).  Concomitant EEG initially showed sharply contoured 6 to 8 Hz theta-alpha activity in the right frontal temporal region which gradually evolved into 2 to 3 Hz delta slowing. Hyperventilation and photic stimulation were not performed.    ABNORMALITY - Focal seizure, right frontotemporal region  - Continuous slow, generalized and lateralized right hemisphere - Burst attenuation, generalized  IMPRESSION: This study showed focal seizures arising from right frontotemporal region on 06/22/2021 at 2001, 2016, 2216, 2038, 2253, 2309 and 2315, lasting about 2 minute each.  During the seizure patient was noted to have left-sided neck jerking and possible left arm jerking ( covered under blanket). EEG was also suggestive of cortical dysfunction arising from right hemisphere, nonspecific etiology but likely secondary to underlying structural abnormality, post-ictal state.  Additionally EEG was initially suggestive of moderate diffuse encephalopathy.  After around 2330 on 06/21/2021, as medications were adjusted, EEG was suggestive of profound diffuse encephalopathy, nonspecific etiology but likely could be secondary to sedation.  Julia [name]   ECHOCARDIOGRAM COMPLETE  Result Date: 06/23/2021    ECHOCARDIOGRAM  REPORT   Patient Name:   Julia [name] [facility] Date of Exam: 06/23/2021 Medical Rec #:  [id number]     Height:       62.0 in Accession #:    [id number]    Weight:       117.7 lb Date of Birth:  March 19, 1932     BSA:  1.526 m Patient Age:    76 years      BP:           149/58 mmHg Patient Gender: F             HR:           80 bpm. Exam Location:  Inpatient Procedure: 2D Echo, 3D Echo, Cardiac Doppler, Color Doppler and Strain Analysis Indications:    Stroke I63.9  History:        Patient has no prior history of Echocardiogram examinations.                 Signs/Symptoms:Fever; Risk Factors:Diabetes and Hypertension.  Sonographer:    Leta Jungling RDCS Referring Phys: [id number] [place] [facility]  Sonographer Comments: Echo performed with patient supine and on artificial respirator. IMPRESSIONS  1. Left ventricular ejection fraction, by estimation, is 65 to 70%. The left ventricle has normal function. The left ventricle has no regional wall motion abnormalities. There is mild left ventricular hypertrophy of the basal segment. Left ventricular diastolic parameters are consistent with Grade I diastolic dysfunction (impaired relaxation).  2. Right ventricular systolic function is normal. The right ventricular size is normal. Tricuspid regurgitation signal is inadequate for assessing PA pressure.  3. The mitral valve is grossly normal. No evidence of mitral valve regurgitation.  4. The aortic valve is grossly normal. Aortic valve regurgitation is not visualized.  5. The inferior vena cava is normal in size with greater than 50% respiratory variability, suggesting right atrial pressure of 3 mmHg. Comparison(s): No prior Echocardiogram. Conclusion(s)/Recommendation(s): Normal biventricular function without evidence of hemodynamically significant valvular heart disease. FINDINGS  Left Ventricle: Left ventricular ejection fraction, by estimation, is 65 to 70%. The left ventricle has normal function. The left ventricle has no regional  wall motion abnormalities. Global longitudinal strain performed but not reported based on interpreter judgement due to suboptimal tracking. The left ventricular internal cavity size was normal in size. There is mild left ventricular hypertrophy of the basal segment. Left ventricular diastolic parameters are consistent with Grade I diastolic dysfunction (impaired relaxation). Right Ventricle: The right ventricular size is normal. No increase in right ventricular wall thickness. Right ventricular systolic function is normal. Tricuspid regurgitation signal is inadequate for assessing PA pressure. Left Atrium: Left atrial size was normal in size. Right Atrium: Right atrial size was normal in size. Pericardium: There is no evidence of pericardial effusion. Mitral Valve: The mitral valve is grossly normal. No evidence of mitral valve regurgitation. Tricuspid Valve: The tricuspid valve is grossly normal. Tricuspid valve regurgitation is not demonstrated. Aortic Valve: Focal calcification. The aortic valve is grossly normal. Aortic valve regurgitation is not visualized. Pulmonic Valve: The pulmonic valve was grossly normal. Pulmonic valve regurgitation is not visualized. Aorta: The aortic root and ascending aorta are structurally normal, with no evidence of dilitation. Venous: The inferior vena cava is normal in size with greater than 50% respiratory variability, suggesting right atrial pressure of 3 mmHg. IAS/Shunts: No atrial level shunt detected by color flow Doppler.  LEFT VENTRICLE PLAX 2D LVIDd:         3.60 cm   Diastology LVIDs:         2.00 cm   LV e' medial:    5.88 cm/s LV PW:         1.00 cm   LV E/e' medial:  16.1 LV IVS:        1.50 cm   LV e' lateral:   6.20 cm/s LVOT  diam:     1.70 cm   LV E/e' lateral: 15.3 LV SV:         57 LV SV Index:   37 LVOT Area:     2.27 cm                           3D Volume EF:                          3D EF:        71 %                          LV EDV:       68 ml                           LV ESV:       20 ml                          LV SV:        49 ml RIGHT VENTRICLE RV S prime:     11.60 cm/s TAPSE (M-mode): 2.4 cm LEFT ATRIUM             Index        RIGHT ATRIUM          Index LA diam:        3.10 cm 2.03 cm/m   RA Area:     6.94 cm LA Vol (A2C):   20.5 ml 13.43 ml/m  RA Volume:   11.80 ml 7.73 ml/m LA Vol (A4C):   36.5 ml 23.92 ml/m LA Biplane Vol: 27.7 ml 18.15 ml/m  AORTIC VALVE LVOT Vmax:   115.00 cm/s LVOT Vmean:  82.800 cm/s LVOT VTI:    0.252 m  AORTA Ao Root diam: 2.80 cm Ao Asc diam:  3.00 cm MITRAL VALVE MV Area (PHT): 3.65 cm     SHUNTS MV Decel Time: 208 msec     Systemic VTI:  0.25 m MV E velocity: 94.70 cm/s   Systemic Diam: 1.70 cm MV A velocity: 110.00 cm/s MV E/A ratio:  0.86 Mary Scientist, physiological signed by Phineas Inches Signature Date/Time: 06/23/2021/2:28:46 PM    Final    VAS [place] LOWER EXTREMITY VENOUS (DVT)  Result Date: 06/22/2021  Lower Venous DVT Study Patient Name:  KAZLYNN HIBBERD [facility]  Date of Exam:   06/22/2021 Medical Rec #: [id number]      Accession #:    [id number] Date of Birth: 01-18-1932      Patient Gender: F Patient Age:   88 years Exam Location:  [facility] Procedure:      VAS [place] LOWER EXTREMITY VENOUS (DVT) Referring Phys: Cornelius Moras XU --------------------------------------------------------------------------------  Indications: Stroke.  Comparison Study: no prior Performing Technologist: Archie Patten RVS  Examination Guidelines: A complete evaluation includes B-mode imaging, spectral Doppler, color Doppler, and power Doppler as needed of all accessible portions of each vessel. Bilateral testing is considered an integral part of a complete examination. Limited examinations for reoccurring indications may be performed as noted. The reflux portion of the exam is performed with the patient in reverse Trendelenburg.  +---------+---------------+---------+-----------+----------+--------------+  RIGHT      Compressibility Phasicity Spontaneity Properties Thrombus Aging  +---------+---------------+---------+-----------+----------+--------------+  CFV       Full  Yes       Yes                                    +---------+---------------+---------+-----------+----------+--------------+  SFJ       Full                                                             +---------+---------------+---------+-----------+----------+--------------+  FV Prox   Full                                                             +---------+---------------+---------+-----------+----------+--------------+  FV Mid    Full            Yes       Yes                                    +---------+---------------+---------+-----------+----------+--------------+  FV Distal Full            Yes       Yes                                    +---------+---------------+---------+-----------+----------+--------------+  PFV       Full                                                             +---------+---------------+---------+-----------+----------+--------------+  POP       Full            Yes       Yes                                    +---------+---------------+---------+-----------+----------+--------------+  PTV       Full                                                             +---------+---------------+---------+-----------+----------+--------------+  PERO      Full                                                             +---------+---------------+---------+-----------+----------+--------------+   +---------+---------------+---------+-----------+----------+--------------+  LEFT      Compressibility Phasicity Spontaneity Properties Thrombus Aging  +---------+---------------+---------+-----------+----------+--------------+  CFV  Full            Yes       Yes                                    +---------+---------------+---------+-----------+----------+--------------+  SFJ       Full                                                              +---------+---------------+---------+-----------+----------+--------------+  FV Prox   Full                                                             +---------+---------------+---------+-----------+----------+--------------+  FV Mid    Full                                                             +---------+---------------+---------+-----------+----------+--------------+  FV Distal                 Yes       Yes                                    +---------+---------------+---------+-----------+----------+--------------+  PFV       Full                                                             +---------+---------------+---------+-----------+----------+--------------+  POP       Full            Yes       Yes                                    +---------+---------------+---------+-----------+----------+--------------+  PTV       Full                                                             +---------+---------------+---------+-----------+----------+--------------+  PERO      Full                                                             +---------+---------------+---------+-----------+----------+--------------+  Summary: BILATERAL: - No evidence of deep vein thrombosis seen in the lower extremities, bilaterally. -No evidence of popliteal cyst, bilaterally.   *See table(s) above for measurements and observations. Electronically signed by Monica Martinez MD on 06/22/2021 at 7:48:31 PM.    Final    CT CHEST ABDOMEN PELVIS WO CONTRAST  Result Date: 07/07/2021 CLINICAL DATA:  Level 1 trauma. Fall. EXAM: CT CHEST, ABDOMEN AND PELVIS WITHOUT CONTRAST TECHNIQUE: Multidetector CT imaging of the chest, abdomen and pelvis was performed following the standard protocol without IV contrast. COMPARISON:  None similar FINDINGS: CT CHEST FINDINGS Cardiovascular: Normal heart size. Trace anterior pericardial effusion which appears low-density. Coronary atherosclerosis. Mediastinum/Nodes:  Endotracheal and enteric tubes are in unremarkable position. No hematoma or pneumomediastinum. 3.2 cm left thyroid nodule, unchanged from 2020. This was evaluated by ultrasound in 2019. No follow-up recommended unless clinically warranted (ref: J Am Coll Radiol. 2015 Feb;12(2): 143-50). Lungs/Pleura: Bands of scarring in the upper lungs with mild, cylindrical left upper lobe bronchiectasis. Prior left apical surgery with lung sutures. No hemothorax, pneumothorax, or lung contusion. Musculoskeletal: Scoliosis.  No acute fracture or subluxation. CT ABDOMEN PELVIS FINDINGS Hepatobiliary: No hepatic injury or perihepatic hematoma. Gallbladder is unremarkable Pancreas: Unremarkable Spleen: No splenic injury or perisplenic hematoma. Adrenals/Urinary Tract: No adrenal hemorrhage or renal injury identified. Bladder is unremarkable. Bilateral nephrolithiasis. Adjacent or lobulated right lower pole calculus in total measuring 9 mm. 3 mm interpolar left renal calculus. Stomach/Bowel: No evidence of injury. Rectal stool distention. The enteric tube tip is at the stomach. Vascular/Lymphatic: Stranding around the right femoral vessels related to phlebotomy per report. Atheromatous calcification. Reproductive: Hysterectomy Other: No ascites or pneumoperitoneum Musculoskeletal: Scoliosis and ordinary degeneration for age. No acute fracture or subluxation. IMPRESSION: No evidence of acute intrathoracic or intra-abdominal injury. Electronically Signed   By: Jorje Guild M.D.   On: 06/22/2021 11:05   CT Maxillofacial Wo Contrast  Result Date: 07/07/2021 CLINICAL DATA:  Level 1 trauma.  Found down. EXAM: CT HEAD WITHOUT CONTRAST CT MAXILLOFACIAL WITHOUT CONTRAST CT CERVICAL SPINE WITHOUT CONTRAST TECHNIQUE: Multidetector CT imaging of the head, cervical spine, and maxillofacial structures were performed using the standard protocol without intravenous contrast. Multiplanar CT image reconstructions of the cervical spine and  maxillofacial structures were also generated. COMPARISON:  None. FINDINGS: CT HEAD FINDINGS Brain: No evidence of acute infarction, hemorrhage, hydrocephalus, extra-axial collection or mass lesion/mass effect. 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 Vascular: No hyperdense vessel or unexpected calcification. Skull: No acute fracture CT MAXILLOFACIAL FINDINGS Osseous: No fracture or mandibular dislocation. No destructive process. Orbits: No evidence of injury Sinuses: Negative for hemosinus Soft tissues: No opaque foreign body or measurable hematoma. CT CERVICAL SPINE FINDINGS Alignment: No traumatic malalignment. Skull base and vertebrae: No acute fracture. No primary bone lesion or focal pathologic process. Soft tissues and spinal canal: No prevertebral fluid or swelling. No visible canal hematoma. Disc levels: Generalized disc collapse with endplate and facet spurring narrowing foramina diffusely, foraminal narrowing especially along the right-side of a scoliotic curvature. Upper chest: Reported separately IMPRESSION: No evidence of acute intracranial or cervical spine injury. Negative for facial fracture. Electronically Signed   By: Jorje Guild M.D.   On: 06/16/2021 11:07    Microbiology Recent Results (from the past 240 hour(s))  Resp Panel by RT-PCR (Flu A&B, Covid) Nasopharyngeal Swab     Status: None   Collection Time: 07/02/2021 10:20 AM   Specimen: Nasopharyngeal Swab; Nasopharyngeal(NP) swabs in vial transport medium  Result Value Ref Range Status  SARS Coronavirus 2 by RT PCR NEGATIVE NEGATIVE Final    Comment: (NOTE) SARS-CoV-2 target nucleic acids are NOT DETECTED.  The SARS-CoV-2 RNA is generally detectable in upper respiratory specimens during the acute phase of infection. The lowest concentration of SARS-CoV-2 viral copies this assay can detect is 138 copies/mL. A negative result does not preclude SARS-Cov-2 infection and should not be used as the sole basis  for treatment or other patient management decisions. A negative result may occur with  improper specimen collection/handling, submission of specimen other than nasopharyngeal swab, presence of viral mutation(s) within the areas targeted by this assay, and inadequate number of viral copies(<138 copies/mL). A negative result must be combined with clinical observations, patient history, and epidemiological information. The expected result is Negative.  Fact Sheet for Patients:  EntrepreneurPulse.com.au  Fact Sheet for Healthcare Providers:  IncredibleEmployment.be  This test is no t yet approved or cleared by the [place] FDA and  has been authorized for detection and/or diagnosis of SARS-CoV-2 by FDA under an Emergency Use Authorization (EUA). This EUA will remain  in effect (meaning this test can be used) for the duration of the COVID-19 declaration under Section 564(b)(1) of the Act, 21 U.S.C.section 360bbb-3(b)(1), unless the authorization is terminated  or revoked sooner.       Influenza A by PCR NEGATIVE NEGATIVE Final   Influenza B by PCR NEGATIVE NEGATIVE Final    Comment: (NOTE) The Xpert Xpress SARS-CoV-2/FLU/RSV plus assay is intended as an aid in the diagnosis of influenza from Nasopharyngeal swab specimens and should not be used as a sole basis for treatment. Nasal washings and aspirates are unacceptable for Xpert Xpress SARS-CoV-2/FLU/RSV testing.  Fact Sheet for Patients: EntrepreneurPulse.com.au  Fact Sheet for Healthcare Providers: IncredibleEmployment.be  This test is not yet approved or cleared by the [place] FDA and has been authorized for detection and/or diagnosis of SARS-CoV-2 by FDA under an Emergency Use Authorization (EUA). This EUA will remain in effect (meaning this test can be used) for the duration of the COVID-19 declaration under Section 564(b)(1) of the Act, 21  U.S.C. section 360bbb-3(b)(1), unless the authorization is terminated or revoked.  Performed at [facility], [place] [address], [place], [place] [address]   MRSA Next Gen by PCR, Nasal     Status: Abnormal   Collection Time: 06/10/2021  7:17 PM   Specimen: Nasal Mucosa; Nasal Swab  Result Value Ref Range Status   MRSA by PCR Next Gen DETECTED (A) NOT DETECTED Final    Comment: RESULT CALLED TO, READ BACK BY AND VERIFIED WITH: BREUO,RN@[id number]  07/04/2021 [name] (NOTE) The GeneXpert MRSA Assay (FDA approved for NASAL specimens only), is one component of a comprehensive MRSA colonization surveillance program. It is not intended to diagnose MRSA infection nor to guide or monitor treatment for MRSA infections. Test performance is not FDA approved in patients less than 77 years old. Performed at [facility], [place] [address], [place], [place] [address]   Culture, blood (Routine X 2) w Reflex to ID Panel     Status: None   Collection Time: 06/21/21 10:55 AM   Specimen: BLOOD  Result Value Ref Range Status   Specimen Description BLOOD BLOOD RIGHT HAND  Final   Special Requests   Final    BOTTLES DRAWN AEROBIC ONLY Blood Culture results may not be optimal due to an inadequate volume of blood received in culture bottles   Culture   Final    NO GROWTH 5 DAYS  Performed at [facility], [place] [address] [name], [place] [address]    Report Status 06/26/2021 FINAL  Final  Culture, blood (Routine X 2) w Reflex to ID Panel     Status: None   Collection Time: 06/21/21 11:12 AM   Specimen: BLOOD  Result Value Ref Range Status   Specimen Description BLOOD BLOOD LEFT HAND  Final   Special Requests   Final    BOTTLES DRAWN AEROBIC ONLY Blood Culture results may not be optimal due to an inadequate volume of blood received in culture bottles   Culture   Final    NO GROWTH 5 DAYS Performed at [facility], [place] [address], [place], [name] [address]    Report Status 06/26/2021 FINAL   Final    Lab Basic Metabolic Panel: Recent Labs  Lab 06/25/21 0410 06/26/21 0410 06/27/21 0659 06/28/21 0257 2021/07/20 0326  NA 150* 145 140 139 136  K 3.3* 3.5 3.4* 3.3* 4.6  CL 113* 110 104 102 101  CO2 31 28 32 30 26  GLUCOSE 140* 194* 217* 187* 212*  BUN 16 17 22 19 17   CREATININE 0.56 0.47 0.53 0.49 0.48  CALCIUM 8.1* 8.0* 8.1* 7.8* 7.8*  PHOS  --   --   --   --  2.5   Liver Function Tests: Recent Labs  Lab July 20, 2021 0326  ALBUMIN 1.8*   No results for input(s): LIPASE, AMYLASE in the last 168 hours. No results for input(s): AMMONIA in the last 168 hours. CBC: Recent Labs  Lab 06/26/21 0410 06/27/21 0659 07-20-21 0326  WBC 12.8* 12.0* 15.4*  NEUTROABS  --  9.7*  --   HGB 9.9* 10.6* 9.7*  HCT 32.3* 34.2* 30.6*  MCV 73.7* 74.0* 72.9*  PLT 188 190 134*   Cardiac Enzymes: No results for input(s): CKTOTAL, CKMB, CKMBINDEX, TROPONINI in the last 168 hours. Sepsis Labs: Recent Labs  Lab 06/26/21 0410 06/27/21 0659 2021/07/20 0326  WBC 12.8* 12.0* 15.4*    Procedures/Operations     Kaesha Kirsch 06/30/2021, 8:47 AM

## 2021-07-09 NOTE — Progress Notes (Signed)
STROKE TEAM PROGRESS NOTE   SUBJECTIVE (INTERVAL HISTORY) No family at the bedside. Pt remains intubated, for respiratory failure unable to be weaned.of ventilatory support.  Of sedation, still not responsive, poor brainstem reflexes.  Trace withdrawal in left upper and lower extremity.   .Neurological exam is unchanged.  Vital signs stable.  I spoke to son yesterday about poor prognosis and he will talk to other family members and is agreeable to talk to palliative care. Discussion held with son 12/21 by palliative care team who changed to DNR but needs more time to discuss with family and look for her living will at home prior to making any further decisions.   OBJECTIVE Temp:  [98.6 F (37 C)-99.9 F (37.7 C)] 99.7 F (37.6 C) (12/22 1125) Pulse Rate:  [68-93] 86 (12/22 1200) Cardiac Rhythm: Normal sinus rhythm (12/22 0400) Resp:  [5-27] 22 (12/22 1200) BP: (127-180)/(59-144) 141/59 (12/22 1200) SpO2:  [96 %-100 %] 98 % (12/22 1200) FiO2 (%):  [30 %] 30 % (12/22 1021) Weight:  [56.4 kg] 56.4 kg (12/22 0500)  Recent Labs  Lab 06/28/21 1953 06/28/21 2352 07/02/21 0338 07-02-21 0725 07-02-21 1124  GLUCAP 180* 199* 204* 201* 166*   Recent Labs  Lab 06/25/21 0410 06/26/21 0410 06/27/21 0659 06/28/21 0257 07-02-2021 0326  NA 150* 145 140 139 136  K 3.3* 3.5 3.4* 3.3* 4.6  CL 113* 110 104 102 101  CO2 31 28 32 30 26  GLUCOSE 140* 194* 217* 187* 212*  BUN 16 17 22 19 17   CREATININE 0.56 0.47 0.53 0.49 0.48  CALCIUM 8.1* 8.0* 8.1* 7.8* 7.8*  PHOS  --   --   --   --  2.5   Recent Labs  Lab 07-02-21 0326  ALBUMIN 1.8*    Recent Labs  Lab 06/23/21 0412 06/26/21 0410 06/27/21 0659 2021-07-02 0326  WBC 16.7* 12.8* 12.0* 15.4*  NEUTROABS  --   --  9.7*  --   HGB 9.1* 9.9* 10.6* 9.7*  HCT 29.7* 32.3* 34.2* 30.6*  MCV 74.6* 73.7* 74.0* 72.9*  PLT 170 188 190 134*   No results for input(s): CKTOTAL, CKMB, CKMBINDEX, TROPONINI in the last 168 hours. No results for  input(s): LABPROT, INR in the last 72 hours.  No results for input(s): COLORURINE, LABSPEC, [place], GLUCOSEU, HGBUR, BILIRUBINUR, KETONESUR, PROTEINUR, UROBILINOGEN, NITRITE, LEUKOCYTESUR in the last 72 hours.  Invalid input(s): APPERANCEUR      Component Value Date/Time   CHOL 94 06/21/2021 1114   TRIG 26 06/24/2021 0250   HDL 61 06/21/2021 1114   CHOLHDL 1.5 06/21/2021 1114   VLDL 8 06/21/2021 1114   LDLCALC 25 06/21/2021 1114   Lab Results  Component Value Date   HGBA1C 8.0 (H) 06/11/2021   No results found for: LABOPIA, COCAINSCRNUR, LABBENZ, AMPHETMU, THCU, LABBARB  No results for input(s): ETH in the last 168 hours.   I have personally reviewed the radiological images below and agree with the radiology interpretations.  CT ANGIO HEAD NECK W WO CM  Result Date: 06/21/2021 CLINICAL DATA:  Stroke/TIA, determine embolic source EXAM: CT ANGIOGRAPHY HEAD AND NECK TECHNIQUE: Multidetector CT imaging of the head and neck was performed using the standard protocol during bolus administration of intravenous contrast. Multiplanar CT image reconstructions and MIPs were obtained to evaluate the vascular anatomy. Carotid stenosis measurements (when applicable) are obtained utilizing NASCET criteria, using the distal internal carotid diameter as the denominator. CONTRAST:  1mL OMNIPAQUE IOHEXOL 350 MG/ML SOLN COMPARISON:  CT and MRI  brain 06/22/2021; MRA head 2015 FINDINGS: CT HEAD Brain: There is no acute intracranial hemorrhage. There is new loss of gray-white differentiation corresponding to some of the areas of infarction seen on the MRI, particularly in the right hemisphere. There is no mass effect. No hydrocephalus. Stable chronic infarcts and chronic microvascular ischemic changes. Vascular: No new finding. Skull: Calvarium is unremarkable. Sinuses/Orbits: No acute finding. Other: None. Review of the MIP images confirms the above findings CTA NECK Aortic arch: Mild calcified plaque along  the arch. Great vessel origins appear. Right carotid system: Patent.  No stenosis. Left carotid system: Patent. Plaque at the ICA origin causes less than 50% stenosis. Vertebral arteries: Patent.  Codominant.  No stenosis. Skeleton: Degenerative changes of the included spine. Other neck: Enlarged, heterogeneous thyroid previously evaluated by ultrasound. Endotracheal and enteric tubes are present Upper chest: Scarring with traction bronchiectasis. Review of the MIP images confirms the above findings CTA HEAD Anterior circulation: Intracranial internal carotid arteries are patent with calcified plaque causing mild stenosis. Anterior and middle cerebral arteries are patent. Atherosclerotic irregularity of distal branches. Posterior circulation: Intracranial vertebral arteries are patent. Basilar artery is patent. Major cerebellar artery origins are patent. Right posterior communicating artery is present. Posterior cerebral arteries are patent. Venous sinuses: Patent as allowed by contrast bolus timing. Review of the MIP images confirms the above findings IMPRESSION: No large vessel occlusion or hemodynamically significant stenosis in the neck. No proximal intracranial vessel occlusion. Atherosclerotic irregularity of medium to small vessel branches in the anterior circulation. Electronically Signed   By: Macy Mis M.D.   On: 06/21/2021 14:48   DG Abd 1 View  Result Date: 06/21/2021 CLINICAL DATA:  NG tube placement. EXAM: ABDOMEN - 1 VIEW COMPARISON:  CT scan 06/23/2021 FINDINGS: The bowel gas pattern is unremarkable. The NG tube tip is in the antropyloric region of the stomach. IMPRESSION: NG tube tip is in the antropyloric region of the stomach. Electronically Signed   By: Marijo Sanes M.D.   On: 06/21/2021 13:37   CT Head Wo Contrast  Result Date: 06/16/2021 CLINICAL DATA:  Level 1 trauma.  Found down. EXAM: CT HEAD WITHOUT CONTRAST CT MAXILLOFACIAL WITHOUT CONTRAST CT CERVICAL SPINE WITHOUT  CONTRAST TECHNIQUE: Multidetector CT imaging of the head, cervical spine, and maxillofacial structures were performed using the standard protocol without intravenous contrast. Multiplanar CT image reconstructions of the cervical spine and maxillofacial structures were also generated. COMPARISON:  None. FINDINGS: CT HEAD FINDINGS Brain: No evidence of acute infarction, hemorrhage, hydrocephalus, extra-axial collection or mass lesion/mass effect. 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 Vascular: No hyperdense vessel or unexpected calcification. Skull: No acute fracture CT MAXILLOFACIAL FINDINGS Osseous: No fracture or mandibular dislocation. No destructive process. Orbits: No evidence of injury Sinuses: Negative for hemosinus Soft tissues: No opaque foreign body or measurable hematoma. CT CERVICAL SPINE FINDINGS Alignment: No traumatic malalignment. Skull base and vertebrae: No acute fracture. No primary bone lesion or focal pathologic process. Soft tissues and spinal canal: No prevertebral fluid or swelling. No visible canal hematoma. Disc levels: Generalized disc collapse with endplate and facet spurring narrowing foramina diffusely, foraminal narrowing especially along the right-side of a scoliotic curvature. Upper chest: Reported separately IMPRESSION: No evidence of acute intracranial or cervical spine injury. Negative for facial fracture. Electronically Signed   By: Jorje Guild M.D.   On: 07/07/2021 11:07   CT Cervical Spine Wo Contrast  Result Date: 06/08/2021 CLINICAL DATA:  Level 1 trauma.  Found down. EXAM: CT HEAD WITHOUT  CONTRAST CT MAXILLOFACIAL WITHOUT CONTRAST CT CERVICAL SPINE WITHOUT CONTRAST TECHNIQUE: Multidetector CT imaging of the head, cervical spine, and maxillofacial structures were performed using the standard protocol without intravenous contrast. Multiplanar CT image reconstructions of the cervical spine and maxillofacial structures were also generated.  COMPARISON:  None. FINDINGS: CT HEAD FINDINGS Brain: No evidence of acute infarction, hemorrhage, hydrocephalus, extra-axial collection or mass lesion/mass effect. 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 Vascular: No hyperdense vessel or unexpected calcification. Skull: No acute fracture CT MAXILLOFACIAL FINDINGS Osseous: No fracture or mandibular dislocation. No destructive process. Orbits: No evidence of injury Sinuses: Negative for hemosinus Soft tissues: No opaque foreign body or measurable hematoma. CT CERVICAL SPINE FINDINGS Alignment: No traumatic malalignment. Skull base and vertebrae: No acute fracture. No primary bone lesion or focal pathologic process. Soft tissues and spinal canal: No prevertebral fluid or swelling. No visible canal hematoma. Disc levels: Generalized disc collapse with endplate and facet spurring narrowing foramina diffusely, foraminal narrowing especially along the right-side of a scoliotic curvature. Upper chest: Reported separately IMPRESSION: No evidence of acute intracranial or cervical spine injury. Negative for facial fracture. Electronically Signed   By: Jorje Guild M.D.   On: 07/08/2021 11:07   MR BRAIN WO CONTRAST  Result Date: 07/06/2021 CLINICAL DATA:  Neuro deficit, stroke suspected EXAM: MRI HEAD WITHOUT CONTRAST TECHNIQUE: Multiplanar, multiecho pulse sequences of the brain and surrounding structures were obtained without intravenous contrast. COMPARISON:  05/17/2019. FINDINGS: Brain: Areas of cortical restricted diffusion with ADC correlate in the right MCA territory (series 5, image 95, for example), with additional smaller areas of cortical restricted diffusion in the right PCA territory (series 5, image 76-7) and right ACA territory (series 5, image 97-99), with additional more punctate right MCA white matter infarcts (series 5, image 86 for example), and with additional scattered left frontal infarcts (series 5, images 88 and 92). No  significant gyral swelling. No acute hemorrhage, mass, mass effect, or midline shift. Confluent T2 hyperintense signal in the periventricular white matter and pons, likely the sequela of severe chronic small vessel ischemic disease. Remote left parietal infarct. Vascular: Normal flow voids. Skull and upper cervical spine: Choose 1 Sinuses/Orbits: Mucosal thickening in the right frontal sinus, right ethmoid air cells, right sphenoid sinus, and right maxillary sinus. Status post bilateral lens replacements. Other: None. IMPRESSION: Restricted diffusion in the right frontal parietal cortex, primarily in the right MCA territory, but also to a lesser extent in the right ACA, right PCA, and left ACA and MCA territory. This is overall concerning for an embolic etiology. No evidence of hemorrhagic conversion, mass effect, or midline shift. These results will be called to the ordering clinician or representative by the Radiologist Assistant, and communication documented in the PACS or [employer]. Electronically Signed   By: Merilyn Baba M.D.   On: 06/08/2021 23:39   DG Pelvis Portable  Result Date: 06/28/2021 CLINICAL DATA:  Trauma EXAM: PORTABLE PELVIS 1-2 VIEWS COMPARISON:  None. FINDINGS: Patient is rotated to the left which somewhat evaluation. There is no evidence of pelvic fracture or diastasis. No pelvic bone lesions are seen. Limited evaluation of the sacrum due to overlying bowel gas. IMPRESSION: No acute osseous abnormality. Electronically Signed   By: Yetta Glassman M.D.   On: 06/30/2021 10:46   DG Chest Port 1 View  Result Date: 06/23/2021 CLINICAL DATA:  Respiratory failure EXAM: PORTABLE CHEST 1 VIEW COMPARISON:  Chest radiograph 06/21/2021 FINDINGS: The endotracheal tube tip is approximately 3.4 cm from the carina.  The left sided vascular catheter terminates at the cavoatrial junction. The enteric catheter tip projects over the region of the pylorus. The cardiomediastinal silhouette is  stable. There are likely small bilateral pleural effusions with adjacent subsegmental atelectasis. Otherwise, there is no new focal consolidation. There is no pulmonary edema. There is no appreciable pneumothorax. Chain sutures in the left upper lobe are unchanged. IMPRESSION: Small bilateral pleural effusions with adjacent atelectasis, increased in conspicuity since 06/21/2021. Electronically Signed   By: Lesia Hausen M.D.   On: 06/23/2021 09:22   DG CHEST PORT 1 VIEW  Result Date: 06/21/2021 CLINICAL DATA:  Shortness of breath EXAM: PORTABLE CHEST 1 VIEW COMPARISON:  Previous studies including the examination of 07-08-2021 FINDINGS: Apparent shift of mediastinum to the left may be due to rotation. Transverse diameter of heart is within normal limits. Thoracic aorta is tortuous and ectatic. Tip of endotracheal tube is 4.1 cm above the carina. Tip of left jugular central venous catheter is seen at the junction of superior vena cava and right atrium. Tip of enteric tube is seen in the stomach. Side-port in the enteric tube is in the lower thoracic esophagus close to gastroesophageal junction. Linear densities in the right upper lung fields may suggest scarring or subsegmental atelectasis. There is no new focal pulmonary consolidation. There is no pleural effusion or pneumothorax. IMPRESSION: There are no new infiltrates or signs of pulmonary edema. Side-port in enteric tube is seen in the lower thoracic esophagus close to gastroesophageal junction. Enteric tube could be advanced 5-10 cm to place the side port within the stomach. Electronically Signed   By: Ernie Avena M.D.   On: 06/21/2021 08:08   DG Chest Portable 1 View  Result Date: 2021-07-08 CLINICAL DATA:  Central line placement EXAM: PORTABLE CHEST 1 VIEW COMPARISON:  Previous studies including examination done earlier today. FINDINGS: Cardiac size is within normal limits. Thoracic aorta is tortuous and ectatic. Tip of endotracheal tube is  approximately 3.6 cm above the carina. Enteric tube is noted traversing the esophagus with its side port at the gastroesophageal junction. There is interval placement of left internal jugular central venous catheter with its tip in the region of junction of superior vena cava and right atrium. Linear density is seen in the right upper lung fields and left mid lung fields suggesting with no significant interval change suggesting scarring or subsegmental atelectasis. There is no new focal consolidation. There are no signs of alveolar pulmonary edema. IMPRESSION: There are no new infiltrates or signs of pulmonary edema. Linear densities in the right upper lobe and left mid lung fields may suggest scarring or subsegmental atelectasis. Other findings as described in the body of the report. Electronically Signed   By: Ernie Avena M.D.   On: 07/08/2021 12:59   DG Chest Port 1 View  Result Date: 2021/07/08 CLINICAL DATA:  Trauma EXAM: PORTABLE CHEST 1 VIEW COMPARISON:  Chest x-ray dated November 02, 2020 FINDINGS: ET tube tip projects approximately 1.0 cm from the carina. Enteric tube partially seen coursing below diaphragm. Evaluation is limited due to left port patient rotation. Bilateral upper lung linear opacities are unchanged compared to prior exam likely due to scarring or atelectasis. No focal consolidation. No large pleural effusion or evidence of pneumothorax. Limited evaluation of cardiac and mediastinal contours due to patient rotation. IMPRESSION: No acute cardiopulmonary no acute cardiopulmonary abnormality, evaluation somewhat limited due to patient rotation. Electronically Signed   By: Allegra Lai M.D.   On: 2021/07/08 10:44  EEG adult  Result Date: 06/27/2021 Charlsie Quest, MD     06/21/2021  2:29 PM Patient Name: TAWSHA TERRERO MRN: [id number] Epilepsy Attending: Charlsie Quest Referring Physician/Provider: Dr Levon Hedger Date: 07/04/2021 Duration: 24.16 mins Patient history: 88yo  F with ams. EEG to evaluate for seizure Level of alertness: comatose AEDs during EEG study: Propofol Technical aspects: This EEG study was done with scalp electrodes positioned according to the 10-20 International system of electrode placement. Electrical activity was acquired at a sampling rate of 500Hz  and reviewed with a high frequency filter of 70Hz  and a low frequency filter of 1Hz . EEG data were recorded continuously and digitally stored. Description: EEG showed continuous low amplitude 2-3hz  delta slowing in right hemisphere. There was also predominantly 5-8Hz  theta-alpha activity in left hemisphere admixed with intermittent 2-3hz  delta slowing. Hyperventilation and photic stimulation were not performed.   ABNORMALITY - Continuous slow, generalized and lateralized right hemisphere IMPRESSION: This study is suggestive of cortical dysfunction arising from right hemisphere, nonspecific etiology but likely secondary to underlying structural abnormality, post-ictal state. Additionally there is moderate diffuse encephalopathy, nonspecific etiology but likely could be secondary to sedation. No seizures or epileptiform discharges were seen throughout the recording. Dr was notified. Priyanka   Overnight EEG with video  Result Date: 06/22/2021 Wilford Corner, MD     06/23/2021  9:24 AM Patient Name: OLENA WILLY MRN: Charlsie Quest Epilepsy Attending: 06/25/2021 Referring Physician/Provider: Dr Jonelle Sidle Duration: 06/21/2021 1445 to 06/22/2021 1445  Patient history: 89yo F with ams. EEG to evaluate for seizure  Level of alertness: comatose  AEDs during EEG study: Propofol, LEV,VPA  Technical aspects: This EEG study was done with scalp electrodes positioned according to the 10-20 International system of electrode placement. Electrical activity was acquired at a sampling rate of 500Hz  and reviewed with a high frequency filter of 70Hz  and a low frequency filter of 1Hz . EEG data were recorded  continuously and digitally stored.  Description: EEG initially showed continuous low amplitude 2-3hz  delta slowing in right hemisphere. There was also predominantly 5-8Hz  theta-alpha activity in left hemisphere admixed with intermittent 2-3hz  delta slowing.  After around 2330 on 06/21/2021, as medications were adjusted, EEG was suggestive of burst attenuation pattern with bursts of low amplitude polymorphic asynchronous 2 to 3 Hz delta slowing with overriding 15 to 18 Hz beta activity lasting 2 to 3 seconds alternating with 5 to 8 seconds of generalized EEG attenuation. Patient event button was pressed on 06/22/2021 at 2001, 2016, 2216, 2038, 2253, 2309 and 2315 for left side neck jerking and possible left arm jerking ( covered under blanket).  Concomitant EEG initially showed sharply contoured 6 to 8 Hz theta-alpha activity in the right frontal temporal region which gradually evolved into 2 to 3 Hz delta slowing. Hyperventilation and photic stimulation were not performed.    ABNORMALITY - Focal seizure, right frontotemporal region  - Continuous slow, generalized and lateralized right hemisphere - Burst attenuation, generalized  IMPRESSION: This study showed focal seizures arising from right frontotemporal region on 06/22/2021 at 2001, 2016, 2216, 2038, 2253, 2309 and 2315, lasting about 2 minute each.  During the seizure patient was noted to have left-sided neck jerking and possible left arm jerking ( covered under blanket). EEG was also suggestive of cortical dysfunction arising from right hemisphere, nonspecific etiology but likely secondary to underlying structural abnormality, post-ictal state.  Additionally EEG was initially suggestive of moderate diffuse encephalopathy.  After around 2330 on 06/21/2021,  as medications were adjusted, EEG was suggestive of profound diffuse encephalopathy, nonspecific etiology but likely could be secondary to sedation.  Lora Havens   ECHOCARDIOGRAM COMPLETE  Result  Date: 06/23/2021    ECHOCARDIOGRAM REPORT   Patient Name:   SYNETHIA TAKACH [facility] Date of Exam: 06/23/2021 Medical Rec #:  [id number]     Height:       62.0 in Accession #:    [id number]    Weight:       117.7 lb Date of Birth:  [birth year]     BSA:          1.526 m Patient Age:    39 years      BP:           149/58 mmHg Patient Gender: F             HR:           80 bpm. Exam Location:  Inpatient Procedure: 2D Echo, 3D Echo, Cardiac Doppler, Color Doppler and Strain Analysis Indications:    Stroke I63.9  History:        Patient has no prior history of Echocardiogram examinations.                 Signs/Symptoms:Fever; Risk Factors:Diabetes and Hypertension.  Sonographer:    Darlina Sicilian RDCS Referring Phys: [id number] [facility]  Sonographer Comments: Echo performed with patient supine and on artificial respirator. IMPRESSIONS  1. Left ventricular ejection fraction, by estimation, is 65 to 70%. The left ventricle has normal function. The left ventricle has no regional wall motion abnormalities. There is mild left ventricular hypertrophy of the basal segment. Left ventricular diastolic parameters are consistent with Grade I diastolic dysfunction (impaired relaxation).  2. Right ventricular systolic function is normal. The right ventricular size is normal. Tricuspid regurgitation signal is inadequate for assessing PA pressure.  3. The mitral valve is grossly normal. No evidence of mitral valve regurgitation.  4. The aortic valve is grossly normal. Aortic valve regurgitation is not visualized.  5. The inferior vena cava is normal in size with greater than 50% respiratory variability, suggesting right atrial pressure of 3 mmHg. Comparison(s): No prior Echocardiogram. Conclusion(s)/Recommendation(s): Normal biventricular function without evidence of hemodynamically significant valvular heart disease. FINDINGS  Left Ventricle: Left ventricular ejection fraction, by estimation, is 65 to 70%. The left ventricle has normal function.  The left ventricle has no regional wall motion abnormalities. Global longitudinal strain performed but not reported based on interpreter judgement due to suboptimal tracking. The left ventricular internal cavity size was normal in size. There is mild left ventricular hypertrophy of the basal segment. Left ventricular diastolic parameters are consistent with Grade I diastolic dysfunction (impaired relaxation). Right Ventricle: The right ventricular size is normal. No increase in right ventricular wall thickness. Right ventricular systolic function is normal. Tricuspid regurgitation signal is inadequate for assessing PA pressure. Left Atrium: Left atrial size was normal in size. Right Atrium: Right atrial size was normal in size. Pericardium: There is no evidence of pericardial effusion. Mitral Valve: The mitral valve is grossly normal. No evidence of mitral valve regurgitation. Tricuspid Valve: The tricuspid valve is grossly normal. Tricuspid valve regurgitation is not demonstrated. Aortic Valve: Focal calcification. The aortic valve is grossly normal. Aortic valve regurgitation is not visualized. Pulmonic Valve: The pulmonic valve was grossly normal. Pulmonic valve regurgitation is not visualized. Aorta: The aortic root and ascending aorta are structurally normal, with no evidence of dilitation. Venous: The inferior vena cava is  normal in size with greater than 50% respiratory variability, suggesting right atrial pressure of 3 mmHg. IAS/Shunts: No atrial level shunt detected by color flow Doppler.  LEFT VENTRICLE PLAX 2D LVIDd:         3.60 cm   Diastology LVIDs:         2.00 cm   LV e' medial:    5.88 cm/s LV PW:         1.00 cm   LV E/e' medial:  16.1 LV IVS:        1.50 cm   LV e' lateral:   6.20 cm/s LVOT diam:     1.70 cm   LV E/e' lateral: 15.3 LV SV:         57 LV SV Index:   37 LVOT Area:     2.27 cm                           3D Volume EF:                          3D EF:        71 %                           LV EDV:       68 ml                          LV ESV:       20 ml                          LV SV:        49 ml RIGHT VENTRICLE RV S prime:     11.60 cm/s TAPSE (M-mode): 2.4 cm LEFT ATRIUM             Index        RIGHT ATRIUM          Index LA diam:        3.10 cm 2.03 cm/m   RA Area:     6.94 cm LA Vol (A2C):   20.5 ml 13.43 ml/m  RA Volume:   11.80 ml 7.73 ml/m LA Vol (A4C):   36.5 ml 23.92 ml/m LA Biplane Vol: 27.7 ml 18.15 ml/m  AORTIC VALVE LVOT Vmax:   115.00 cm/s LVOT Vmean:  82.800 cm/s LVOT VTI:    0.252 m  AORTA Ao Root diam: 2.80 cm Ao Asc diam:  3.00 cm MITRAL VALVE MV Area (PHT): 3.65 cm     SHUNTS MV Decel Time: 208 msec     Systemic VTI:  0.25 m MV E velocity: 94.70 cm/s   Systemic Diam: 1.70 cm MV A velocity: 110.00 cm/s MV E/A ratio:  0.86 Mary Scientist, physiological signed by Phineas Inches Signature Date/Time: 06/23/2021/2:28:46 PM    Final    VAS [place] LOWER EXTREMITY VENOUS (DVT)  Result Date: 06/22/2021  Lower Venous DVT Study Patient Name:  RYLEAH TURKOVICH [facility]  Date of Exam:   06/22/2021 Medical Rec #: [id number]      Accession #:    [id number] Date of Birth: 1931/07/27      Patient Gender: F Patient Age:   27 years Exam Location:  [facility] Procedure:      VAS [place] LOWER EXTREMITY VENOUS (DVT)  Referring Phys: Rosalin Hawking --------------------------------------------------------------------------------  Indications: Stroke.  Comparison Study: no prior Performing Technologist: Archie Patten RVS  Examination Guidelines: A complete evaluation includes B-mode imaging, spectral Doppler, color Doppler, and power Doppler as needed of all accessible portions of each vessel. Bilateral testing is considered an integral part of a complete examination. Limited examinations for reoccurring indications may be performed as noted. The reflux portion of the exam is performed with the patient in reverse Trendelenburg.  +---------+---------------+---------+-----------+----------+--------------+  RIGHT      Compressibility Phasicity Spontaneity Properties Thrombus Aging  +---------+---------------+---------+-----------+----------+--------------+  CFV       Full            Yes       Yes                                    +---------+---------------+---------+-----------+----------+--------------+  SFJ       Full                                                             +---------+---------------+---------+-----------+----------+--------------+  FV Prox   Full                                                             +---------+---------------+---------+-----------+----------+--------------+  FV Mid    Full            Yes       Yes                                    +---------+---------------+---------+-----------+----------+--------------+  FV Distal Full            Yes       Yes                                    +---------+---------------+---------+-----------+----------+--------------+  PFV       Full                                                             +---------+---------------+---------+-----------+----------+--------------+  POP       Full            Yes       Yes                                    +---------+---------------+---------+-----------+----------+--------------+  PTV       Full                                                             +---------+---------------+---------+-----------+----------+--------------+  PERO      Full                                                             +---------+---------------+---------+-----------+----------+--------------+   +---------+---------------+---------+-----------+----------+--------------+  LEFT      Compressibility Phasicity Spontaneity Properties Thrombus Aging  +---------+---------------+---------+-----------+----------+--------------+  CFV       Full            Yes       Yes                                    +---------+---------------+---------+-----------+----------+--------------+  SFJ       Full                                                              +---------+---------------+---------+-----------+----------+--------------+  FV Prox   Full                                                             +---------+---------------+---------+-----------+----------+--------------+  FV Mid    Full                                                             +---------+---------------+---------+-----------+----------+--------------+  FV Distal                 Yes       Yes                                    +---------+---------------+---------+-----------+----------+--------------+  PFV       Full                                                             +---------+---------------+---------+-----------+----------+--------------+  POP       Full            Yes       Yes                                    +---------+---------------+---------+-----------+----------+--------------+  PTV       Full                                                             +---------+---------------+---------+-----------+----------+--------------+  PERO      Full                                                             +---------+---------------+---------+-----------+----------+--------------+     Summary: BILATERAL: - No evidence of deep vein thrombosis seen in the lower extremities, bilaterally. -No evidence of popliteal cyst, bilaterally.   *See table(s) above for measurements and observations. Electronically signed by Monica Martinez MD on 06/22/2021 at 7:48:31 PM.    Final    CT CHEST ABDOMEN PELVIS WO CONTRAST  Result Date: 06/09/2021 CLINICAL DATA:  Level 1 trauma. Fall. EXAM: CT CHEST, ABDOMEN AND PELVIS WITHOUT CONTRAST TECHNIQUE: Multidetector CT imaging of the chest, abdomen and pelvis was performed following the standard protocol without IV contrast. COMPARISON:  None similar FINDINGS: CT CHEST FINDINGS Cardiovascular: Normal heart size. Trace anterior pericardial effusion which appears low-density. Coronary atherosclerosis. Mediastinum/Nodes:  Endotracheal and enteric tubes are in unremarkable position. No hematoma or pneumomediastinum. 3.2 cm left thyroid nodule, unchanged from 2020. This was evaluated by ultrasound in 2019. No follow-up recommended unless clinically warranted (ref: J Am Coll Radiol. 2015 Feb;12(2): 143-50). Lungs/Pleura: Bands of scarring in the upper lungs with mild, cylindrical left upper lobe bronchiectasis. Prior left apical surgery with lung sutures. No hemothorax, pneumothorax, or lung contusion. Musculoskeletal: Scoliosis.  No acute fracture or subluxation. CT ABDOMEN PELVIS FINDINGS Hepatobiliary: No hepatic injury or perihepatic hematoma. Gallbladder is unremarkable Pancreas: Unremarkable Spleen: No splenic injury or perisplenic hematoma. Adrenals/Urinary Tract: No adrenal hemorrhage or renal injury identified. Bladder is unremarkable. Bilateral nephrolithiasis. Adjacent or lobulated right lower pole calculus in total measuring 9 mm. 3 mm interpolar left renal calculus. Stomach/Bowel: No evidence of injury. Rectal stool distention. The enteric tube tip is at the stomach. Vascular/Lymphatic: Stranding around the right femoral vessels related to phlebotomy per report. Atheromatous calcification. Reproductive: Hysterectomy Other: No ascites or pneumoperitoneum Musculoskeletal: Scoliosis and ordinary degeneration for age. No acute fracture or subluxation. IMPRESSION: No evidence of acute intrathoracic or intra-abdominal injury. Electronically Signed   By: Jorje Guild M.D.   On: 07/08/2021 11:05   CT Maxillofacial Wo Contrast  Result Date: 07/01/2021 CLINICAL DATA:  Level 1 trauma.  Found down. EXAM: CT HEAD WITHOUT CONTRAST CT MAXILLOFACIAL WITHOUT CONTRAST CT CERVICAL SPINE WITHOUT CONTRAST TECHNIQUE: Multidetector CT imaging of the head, cervical spine, and maxillofacial structures were performed using the standard protocol without intravenous contrast. Multiplanar CT image reconstructions of the cervical spine and  maxillofacial structures were also generated. COMPARISON:  None. FINDINGS: CT HEAD FINDINGS Brain: No evidence of acute infarction, hemorrhage, hydrocephalus, extra-axial collection or mass lesion/mass effect. 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 Vascular: No hyperdense vessel or unexpected calcification. Skull: No acute fracture CT MAXILLOFACIAL FINDINGS Osseous: No fracture or mandibular dislocation. No destructive process. Orbits: No evidence of injury Sinuses: Negative for hemosinus Soft tissues: No opaque foreign body or measurable hematoma. CT CERVICAL SPINE FINDINGS Alignment: No traumatic malalignment. Skull base and vertebrae: No acute fracture. No primary bone lesion or focal pathologic process. Soft tissues and spinal canal: No prevertebral fluid or swelling. No visible canal hematoma. Disc levels: Generalized disc collapse with endplate and facet spurring narrowing foramina diffusely, foraminal narrowing especially along the right-side of a scoliotic curvature. Upper chest: Reported separately IMPRESSION: No evidence of acute intracranial or  cervical spine injury. Negative for facial fracture. Electronically Signed   By: Jorje Guild M.D.   On: 07/06/2021 11:07     PHYSICAL EXAM  Temp:  [98.6 F (37 C)-99.9 F (37.7 C)] 99.7 F (37.6 C) (12/22 1125) Pulse Rate:  [68-93] 86 (12/22 1200) Resp:  [5-27] 22 (12/22 1200) BP: (127-180)/(59-144) 141/59 (12/22 1200) SpO2:  [96 %-100 %] 98 % (12/22 1200) FiO2 (%):  [30 %] 30 % (12/22 1021) Weight:  [56.4 kg] 56.4 kg (12/22 0500)  General - Well nourished, well developed elderly lady, intubated off sedation.  Ophthalmologic - fundi not visualized due to noncooperation.  Cardiovascular - Regular rate and rhythm.  Neuro - intubated  off sedation but remains unresponsive, eyes closed, not following commands. With forced eye opening, eyes in mid position, not blinking to visual threat, doll's eyes absent, not tracking,  right pupil 1.17mm but reactive, left pupil pinpoint but irregular shape with evidence of prior ophthalmic surgery. Corneal reflex absent bilaterally, gag and cough present. Breathing over the vent.  Facial symmetry not able to test due to ET tube.  Tongue protrusion not cooperative. On pain stimulation, no significant movement of all extremities except trace withdrawal in the left greater than right lower extremity.. No babinski. Sensation, coordination and gait not tested.   ASSESSMENT/PLAN Ms. CORNELLA RUFFER is a 88 y.o. female with history of cerebral palsy, hypertension, diabetes, sarcoidosis admitted for #home with facial trauma, altered mental status. No tPA given due to unknown last known well.    Stroke:  right MCA and ACA infarcts embolic pattern secondary to unclear source, concerning for occult A. fib CT no acute abnormality MRI right MCA, MCA/PCA and MCA/ACA scattered infarcts CT head and neck unremarkable 2D Echo ejection fraction 65 to 70%. LE venous Doppler no DVT LDL 25 HgbA1c 8.0 Heparin subcu for VTE prophylaxis aspirin 81 mg daily prior to admission, now on aspirin 325 mg daily given potential procedures (trach or PEG) Ongoing aggressive stroke risk factor management Therapy recommendations: Pending Disposition: Pending, agree with CCM, if pt not make much progress over the weekend, will consider palliative care involvement.   Seizure Reported right facial twitching and right arm jerking movement Received Versed and propofol bolus EEG 12/13 cortical dysfunction arising from right hemisphere, nonspecific etiology  LTM EEG showed focal seizures arising from right frontotemporal region lasting about 2 minute each.  During the seizure patient was noted to have left-sided neck jerking and possible left arm jerking  Keppra load followed by 500 bid->1500 bid Depakote load x 2, increased to 750 milligrams Q6 Depakote level 33 -> 55. >62 Now off sedation, few brief silent  seizures continue on long-term EEG monitoring Fever Leukocytosis  Spiking fever 12/13, T-max 100.9->afebrile Today afebrile WBC 17.8-19.0-14.2-16.7 UA WBC 20-51 CXR unremarkable Blood culture NGTD On cefepime -> zosyn  Diabetes, uncontrolled HgbA1c 8.0 goal < 7.0 Uncontrolled Currently on Levemir 5->10->15 U twice daily CBG monitoring SSI DM education and close PCP follow up  Respiratory failure Intubated on sedation CCM on board Not candidate for extubation today  Hypertension Stable Long term BP goal normotensive  Other Stroke Risk Factors Advanced age  Other Active Problems Cerebral palsy Sarcoidosis  Hospital day # 9 Patient neurological exam continues to remain quite poor and patient does not wake up despite being off sedation and not having seizures now for several days making prognosis quite poor. Discussion held with son 12/21 by pulmonary and palliative care team and changed patient's status to DNR  but son needs more time to discuss with family and look for her living will at home prior to making any further decisions. .  Discussed with Dr. Tacy Learn critical care medicine.  Stroke team will sign off but be available if necessary. This patient is critically ill due to right MCA and ACA infarcts, seizure, leukocytosis, respiratory failure, dysphagia and at significant risk of neurological worsening, death form recurrent stroke, status epilepticus, sepsis, heart failure. This patient's care requires constant monitoring of vital signs, hemodynamics, respiratory and cardiac monitoring, review of multiple databases, neurological assessment, discussion with family, other specialists and medical decision making of high complexity. I spent 30 minutes of neurocritical care time in the care of this patient.   Antony Contras, MD Stroke Neurology 07-05-2021 1:02 PM    To contact Stroke Continuity provider, please refer to [email or web address] After hours, contact General Neurology

## 2021-07-09 NOTE — Progress Notes (Signed)
Son, Alecia Lemming showed up and presented patient's living will.  He is ready to proceed to transition to comfort care and compassionate extubation.  However he left prior to speaking with provider.    I was able to reach him by phone, [phone], where he confirmed plans.  He discussed her condition with her PCP and with reviewing her living will, wants to honor her wishes via living as she has not made any significant progression for hope of a meaningful recovery.   He will be back around 1630 after she is "comfortable" and ready to proceed then with one way extubation.     P:  Orders changed to comfort orders only Will start morphine gtt PRN ativan, benadryl, and robinul as needed.  Once son arrives around 1630, we will proceed with compassionate extubation.     Additional time 15 mins     Posey Boyer, ACNP  Pulmonary & Critical Care 06/12/2021, 3:49 PM  See Amion for pager If no response to pager, please call PCCM consult pager After 7:00 pm call Elink

## 2021-07-09 NOTE — Progress Notes (Signed)
Patients son in room for extubation, all questions and concerns answered at this time.

## 2021-07-09 DEATH — deceased

## 2022-10-17 IMAGING — DX DG ABDOMEN 1V
1 series · 1 of 1 positions shown · non-contrast
Comparison: CT scan 06/20/2021

CLINICAL DATA: NG tube placement.

EXAM:
ABDOMEN - 1 VIEW

[abdomen]
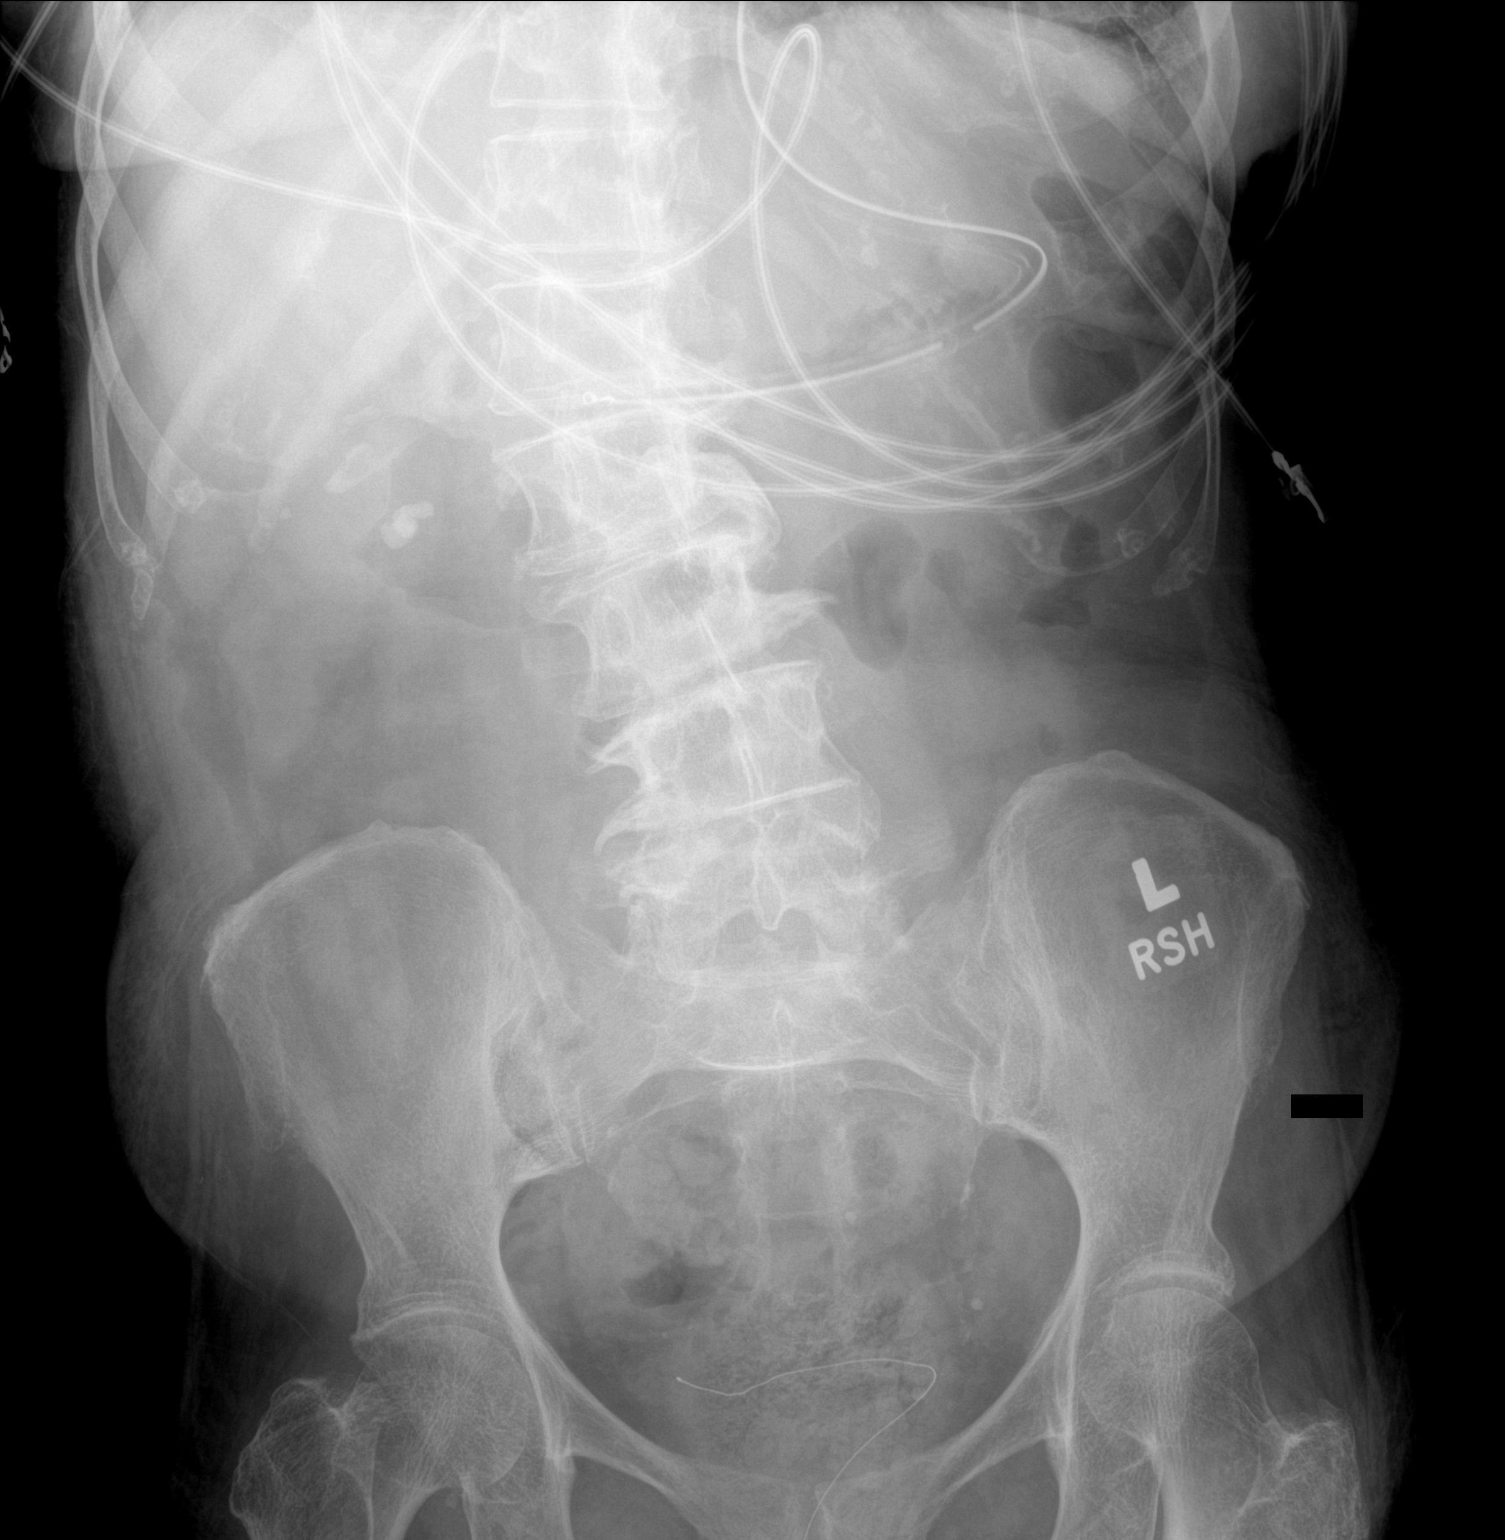

[1 of 1 positions shown; findings below may reference images not displayed]

FINDINGS: The bowel gas pattern is unremarkable.

The NG tube tip is in the antropyloric region of the stomach.
IMPRESSION: NG tube tip is in the antropyloric region of the stomach.

## 2022-10-17 IMAGING — CT CT ANGIO HEAD-NECK (W OR W/O PERF)
2 of 11 series · 6 of 35 positions shown · IV contrast (OMNI 350)
Comparison: CT and MRI brain 06/20/2021; MRA head 0166

CLINICAL DATA: Stroke/TIA, determine embolic source

EXAM:
CT ANGIOGRAPHY HEAD AND NECK
TECHNIQUE: Multidetector CT imaging of the head and neck was performed using
the standard protocol during bolus administration of intravenous
contrast. Multiplanar CT image reconstructions and MIPs were
obtained to evaluate the vascular anatomy. Carotid stenosis
measurements (when applicable) are obtained utilizing NASCET
criteria, using the distal internal carotid diameter as the
denominator.
CONTRAST:  75mL OMNIPAQUE IOHEXOL 350 MG/ML SOLN

[Series 12: cta neck axial · axial · 0.39mm/px · z∈[-360,-114]mm · 5 of 372 slices shown]
[im 62/372  soft-tissue]
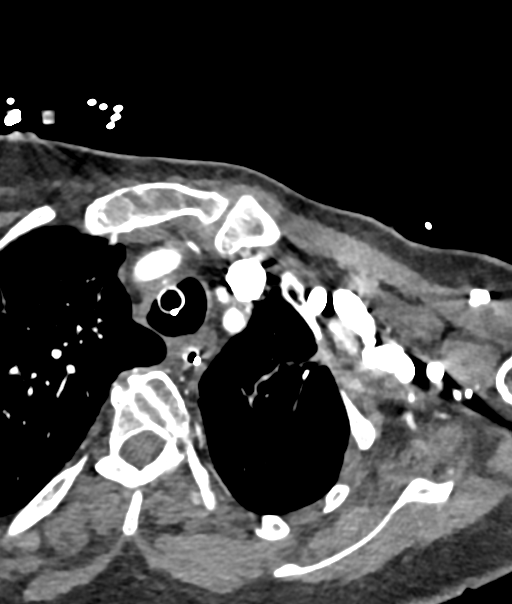
[im 124/372  bone]
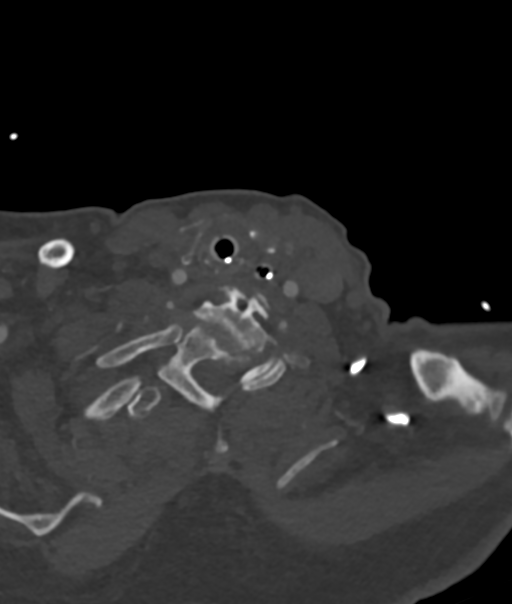
[im 186/372  soft-tissue]
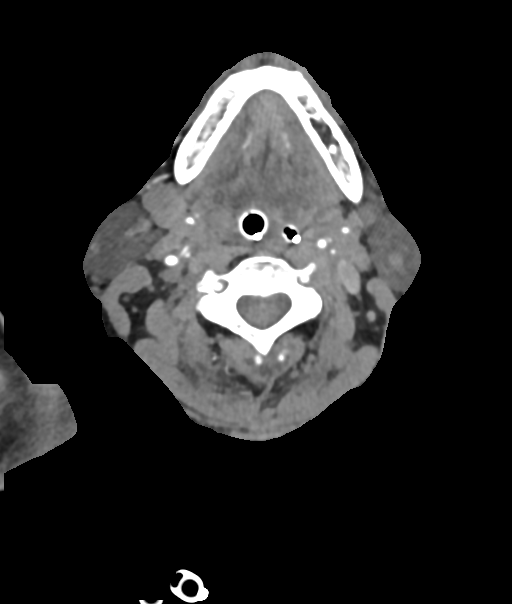
[im 248/372  bone]
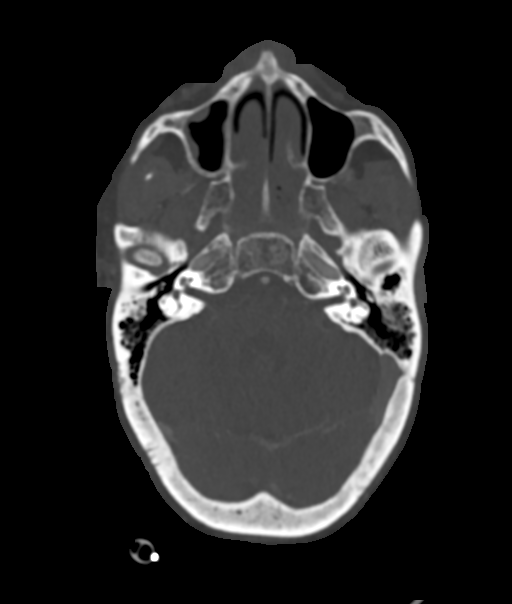
[im 310/372  soft-tissue]
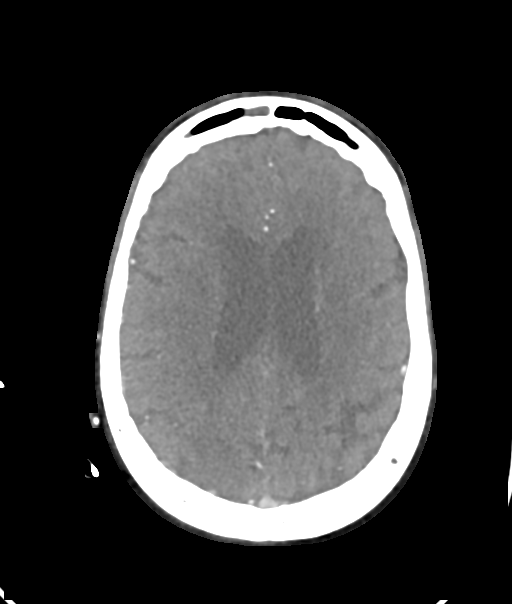

[Series 14: cta neck sagittal · sagittal · 0.47mm/px · 1 of 201 slices shown]
[im 165/201  soft-tissue]
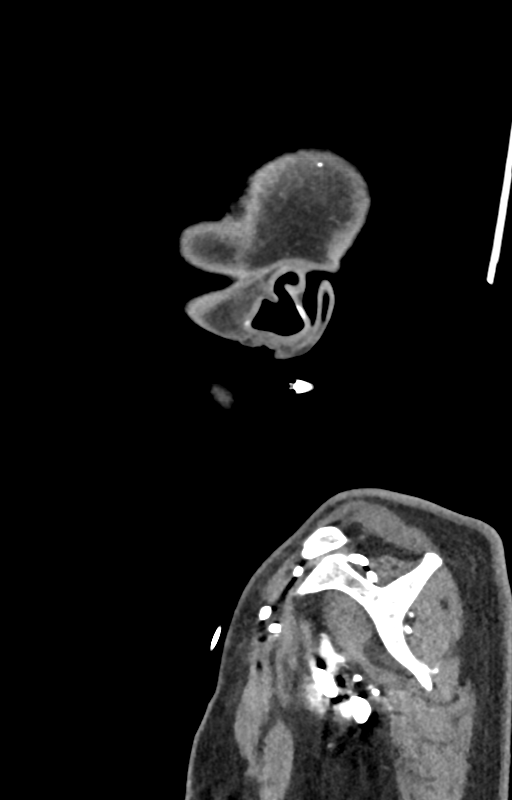

[6 of 35 positions shown; findings below may reference images not displayed]

FINDINGS: CT HEAD

Brain: There is no acute intracranial hemorrhage. There is new loss
of gray-white differentiation corresponding to some of the areas of
infarction seen on the MRI, particularly in the right hemisphere.
There is no mass effect. No hydrocephalus. Stable chronic infarcts
and chronic microvascular ischemic changes.

Vascular: No new finding.

Skull: Calvarium is unremarkable.

Sinuses/Orbits: No acute finding.

Other: None.

Review of the MIP images confirms the above findings

CTA NECK

Aortic arch: Mild calcified plaque along the arch. Great vessel
origins appear.

Right carotid system: Patent.  No stenosis.

Left carotid system: Patent. Plaque at the ICA origin causes less
than 50% stenosis.

Vertebral arteries: Patent.  Codominant.  No stenosis.

Skeleton: Degenerative changes of the included spine.

Other neck: Enlarged, heterogeneous thyroid previously evaluated by
ultrasound. Endotracheal and enteric tubes are present

Upper chest: Scarring with traction bronchiectasis.

Review of the MIP images confirms the above findings

CTA HEAD

Anterior circulation: Intracranial internal carotid arteries are
patent with calcified plaque causing mild stenosis. Anterior and
middle cerebral arteries are patent. Atherosclerotic irregularity of
distal branches.

Posterior circulation: Intracranial vertebral arteries are patent.
Basilar artery is patent. Major cerebellar artery origins are
patent. Right posterior communicating artery is present. Posterior
cerebral arteries are patent.

Venous sinuses: Patent as allowed by contrast bolus timing.

Review of the MIP images confirms the above findings
IMPRESSION: No large vessel occlusion or hemodynamically significant stenosis in
the neck.

No proximal intracranial vessel occlusion. Atherosclerotic
irregularity of medium to small vessel branches in the anterior
circulation.
